# Patient Record
Sex: Male | Born: 1960 | Race: Black or African American | Hispanic: No | Marital: Married | Smoking: Never smoker
Health system: Southern US, Community
[De-identification: ages and names within clinical notes are randomized; demographics above are authoritative.]

## PROBLEM LIST (undated history)

## (undated) DIAGNOSIS — I209 Angina pectoris, unspecified: Secondary | ICD-10-CM

## (undated) DIAGNOSIS — I2510 Atherosclerotic heart disease of native coronary artery without angina pectoris: Secondary | ICD-10-CM

## (undated) DIAGNOSIS — N529 Male erectile dysfunction, unspecified: Secondary | ICD-10-CM

## (undated) DIAGNOSIS — I69351 Hemiplegia and hemiparesis following cerebral infarction affecting right dominant side: Secondary | ICD-10-CM

## (undated) DIAGNOSIS — N189 Chronic kidney disease, unspecified: Secondary | ICD-10-CM

## (undated) DIAGNOSIS — M159 Polyosteoarthritis, unspecified: Secondary | ICD-10-CM

## (undated) DIAGNOSIS — G252 Other specified forms of tremor: Secondary | ICD-10-CM

## (undated) DIAGNOSIS — R011 Cardiac murmur, unspecified: Secondary | ICD-10-CM

## (undated) DIAGNOSIS — I5189 Other ill-defined heart diseases: Secondary | ICD-10-CM

## (undated) DIAGNOSIS — M5030 Other cervical disc degeneration, unspecified cervical region: Secondary | ICD-10-CM

## (undated) DIAGNOSIS — I700 Atherosclerosis of aorta: Secondary | ICD-10-CM

## (undated) DIAGNOSIS — I10 Essential (primary) hypertension: Secondary | ICD-10-CM

## (undated) DIAGNOSIS — Z9289 Personal history of other medical treatment: Secondary | ICD-10-CM

## (undated) DIAGNOSIS — R0600 Dyspnea, unspecified: Secondary | ICD-10-CM

## (undated) DIAGNOSIS — G214 Vascular parkinsonism: Secondary | ICD-10-CM

## (undated) DIAGNOSIS — R55 Syncope and collapse: Secondary | ICD-10-CM

## (undated) DIAGNOSIS — R42 Dizziness and giddiness: Secondary | ICD-10-CM

## (undated) DIAGNOSIS — R259 Unspecified abnormal involuntary movements: Secondary | ICD-10-CM

## (undated) DIAGNOSIS — Z7982 Long term (current) use of aspirin: Secondary | ICD-10-CM

## (undated) DIAGNOSIS — E785 Hyperlipidemia, unspecified: Secondary | ICD-10-CM

## (undated) DIAGNOSIS — R29898 Other symptoms and signs involving the musculoskeletal system: Secondary | ICD-10-CM

## (undated) DIAGNOSIS — Z8739 Personal history of other diseases of the musculoskeletal system and connective tissue: Secondary | ICD-10-CM

## (undated) DIAGNOSIS — I6381 Other cerebral infarction due to occlusion or stenosis of small artery: Secondary | ICD-10-CM

## (undated) DIAGNOSIS — R002 Palpitations: Secondary | ICD-10-CM

## (undated) DIAGNOSIS — K219 Gastro-esophageal reflux disease without esophagitis: Secondary | ICD-10-CM

## (undated) DIAGNOSIS — I639 Cerebral infarction, unspecified: Secondary | ICD-10-CM

## (undated) DIAGNOSIS — G20A1 Parkinson's disease without dyskinesia, without mention of fluctuations: Secondary | ICD-10-CM

## (undated) HISTORY — DX: Syncope and collapse: R55

## (undated) HISTORY — DX: Essential (primary) hypertension: I10

## (undated) HISTORY — PX: HEMORRHOID BANDING: SHX5850

## (undated) HISTORY — DX: Other cerebral infarction due to occlusion or stenosis of small artery: I63.81

## (undated) HISTORY — DX: Hyperlipidemia, unspecified: E78.5

## (undated) HISTORY — DX: Unspecified abnormal involuntary movements: R25.9

## (undated) HISTORY — DX: Palpitations: R00.2

## (undated) HISTORY — DX: Personal history of other medical treatment: Z92.89

## (undated) HISTORY — DX: Dizziness and giddiness: R42

## (undated) HISTORY — DX: Cardiac murmur, unspecified: R01.1

## (undated) HISTORY — DX: Other symptoms and signs involving the musculoskeletal system: R29.898

## (undated) HISTORY — PX: APPENDECTOMY: SHX54

## (undated) HISTORY — DX: Other specified forms of tremor: G25.2

---

## 2004-04-24 ENCOUNTER — Ambulatory Visit: Payer: Self-pay | Admitting: Internal Medicine

## 2009-11-07 ENCOUNTER — Emergency Department: Payer: Self-pay | Admitting: Emergency Medicine

## 2010-07-18 ENCOUNTER — Ambulatory Visit: Payer: Self-pay | Admitting: Anesthesiology

## 2011-12-05 ENCOUNTER — Emergency Department: Payer: Self-pay | Admitting: Unknown Physician Specialty

## 2011-12-05 LAB — COMPREHENSIVE METABOLIC PANEL
Albumin: 4.2 g/dL (ref 3.4–5.0)
BUN: 14 mg/dL (ref 7–18)
Chloride: 107 mmol/L (ref 98–107)
Co2: 27 mmol/L (ref 21–32)
EGFR (African American): >60
EGFR (Non-African Amer.): 59 — ABNORMAL LOW
SGOT(AST): 26 U/L (ref 15–37)
SGPT (ALT): 29 U/L (ref 12–78)
Total Protein: 8.8 g/dL — ABNORMAL HIGH (ref 6.4–8.2)

## 2011-12-05 LAB — URINALYSIS, COMPLETE
Blood: NEGATIVE
Ketone: NEGATIVE
Nitrite: NEGATIVE
Ph: 8 (ref 4.5–8.0)
Protein: NEGATIVE
Specific Gravity: 1.003 (ref 1.003–1.030)
Squamous Epithelial: NONE SEEN

## 2011-12-05 LAB — CBC
HCT: 43.2 % (ref 40.0–52.0)
MCHC: 34.7 g/dL (ref 32.0–36.0)
MCV: 84 fL (ref 80–100)
RBC: 5.18 10*6/uL (ref 4.40–5.90)
WBC: 5.5 10*3/uL (ref 3.8–10.6)

## 2011-12-05 LAB — TROPONIN I: Troponin-I: 0.03 ng/mL

## 2011-12-05 LAB — TSH: Thyroid Stimulating Horm: 1.8 u[IU]/mL

## 2011-12-05 LAB — MAGNESIUM: Magnesium: 1.8 mg/dL

## 2012-08-19 ENCOUNTER — Encounter: Payer: Self-pay | Admitting: Cardiovascular Disease

## 2012-08-19 ENCOUNTER — Ambulatory Visit (INDEPENDENT_AMBULATORY_CARE_PROVIDER_SITE_OTHER): Payer: BC Managed Care – PPO | Admitting: Cardiovascular Disease

## 2012-08-19 VITALS — BP 162/80 | HR 65 | Ht 72.0 in | Wt 315.8 lb

## 2012-08-19 DIAGNOSIS — I10 Essential (primary) hypertension: Secondary | ICD-10-CM | POA: Insufficient documentation

## 2012-08-19 DIAGNOSIS — R42 Dizziness and giddiness: Secondary | ICD-10-CM | POA: Insufficient documentation

## 2012-08-19 NOTE — Assessment & Plan Note (Signed)
Vertigo history, previously seen by ear nose throat. Currently with no symptoms

## 2012-08-19 NOTE — Assessment & Plan Note (Signed)
He reports having recent weight loss. We have commended him on his efforts. We have encouraged continued exercise, careful diet management in an effort to lose weight.

## 2012-08-19 NOTE — Progress Notes (Signed)
Patient ID: Trevor Flores, male    [name]: 1960-12-06, 52 y.o.   MRN: [id number]  HPI Comments: [name] is a very pleasant 52 year old gentleman, patient of Dr. Maryellen Pile, who presents to establish care for hypertension, lightheadedness, history of vertigo.  He states that over the past several years, he has had episodes of dizzy spells lasting 2-3 seconds. His last episode was this past week. He got out of his truck, walked to a store and was waiting at the counter. He had lightheadedness for several seconds before it resolved. He described having a little bit of spinning.  Otherwise she feels well has no complaints. In October 2013, he had positional vertigo described as a spinning when he sat up, better when he laid down. He went to the emergency room, had a CT scan and was told that he may have had an old stroke. Carotid ultrasound per his report showed no significant stenoses. He was seen by ear nose throat for his vertigo.  Blood pressure at home has been well-controlled with systolic pressure 130, diastolic 80. He walks every other day several miles at a time with no problems. He's been losing weight. With his weight declined, cholesterol has been decreasing  Previously seen by Dr. Juliann Pares. Echocardiogram October 2013 documented ejection fraction 60%, normal wall motion. No mention of any valvular disease he states that he took simvastatin on one occasion and that that he had side effects and stopped the medication.   Treadmill nuclear stress test every 2006. He was told that there is no significant blockage. Exercised well on the treadmill  EKG today shows normal sinus rhythm with rate 65 beats per minute, no significant ST or T wave changes  Lab work shows total cholesterol 187, LDL 129, vitamin D 15    Outpatient Encounter Prescriptions as of 08/19/2012  Medication Sig Dispense Refill  . diltiazem (TIAZAC) 420 MG 24 hr capsule Take 420 mg by mouth daily.      Marland Kitchen lisinopril  (PRINIVIL,ZESTRIL) 20 MG tablet Take 20 mg by mouth daily.       Review of Systems  Constitutional: Negative.   HENT: Negative.   Eyes: Negative.   Respiratory: Negative.   Cardiovascular: Negative.   Gastrointestinal: Negative.   Musculoskeletal: Negative.   Skin: Negative.   Neurological: Positive for dizziness.  Psychiatric/Behavioral: Negative.   All other systems reviewed and are negative.    BP 162/80  Pulse 65  Ht 6' (1.829 m)  Wt 315 lb 12 oz (143.223 kg)  BMI 42.81 kg/m2  Physical Exam  Nursing note and vitals reviewed. Constitutional: He is oriented to person, place, and time. He appears well-developed and well-nourished.  HENT:  Head: Normocephalic.  Nose: Nose normal.  Mouth/Throat: Oropharynx is clear and moist.  Eyes: Conjunctivae are normal. Pupils are equal, round, and reactive to light.  Neck: Normal range of motion. Neck supple. No JVD present.  Cardiovascular: Normal rate, regular rhythm, S1 normal, S2 normal, normal heart sounds and intact distal pulses.  Exam reveals no gallop and no friction rub.   No murmur heard. Pulmonary/Chest: Effort normal and breath sounds normal. No respiratory distress. He has no wheezes. He has no rales. He exhibits no tenderness.  Abdominal: Soft. Bowel sounds are normal. He exhibits no distension. There is no tenderness.  Musculoskeletal: Normal range of motion. He exhibits no edema and no tenderness.  Lymphadenopathy:    He has no cervical adenopathy.  Neurological: He is alert and oriented to person, place,  and time. Coordination normal.  Skin: Skin is warm and dry. No rash noted. No erythema.  Psychiatric: He has a normal mood and affect. His behavior is normal. Judgment and thought content normal.      Assessment and Plan

## 2012-08-19 NOTE — Assessment & Plan Note (Signed)
Blood pressures elevated in the office today though he reports well controlled at home with systolic pressure 130s. We have suggested he continue to monitor his blood pressure.

## 2012-08-19 NOTE — Patient Instructions (Addendum)
You are doing well. No medication changes were made. Consider taking Red Yeast Rice for cholesterol  Please call us if you have new issues that need to be addressed before your next appt.  Your physician wants you to follow-up in: 12 months.  You will receive a reminder letter in the mail two months in advance. If you don't receive a letter, please call our office to schedule the follow-up appointment.

## 2012-08-19 NOTE — Assessment & Plan Note (Addendum)
Record reviewed from Dr. Juliann Pares and Dr. Maryellen Pile. Etiology of his dizziness is uncertain. Last for only several seconds, most recently while standing. No evidence of orthostasis. Concerning for arrhythmia. Normal EKG today. If he continues to have symptoms, we could do a Holter monitor or 30 day monitor. We have suggested he take his diltiazem in the evening, lisinopril in the morning to make sure that is not orthostatic.

## 2013-08-06 ENCOUNTER — Ambulatory Visit (INDEPENDENT_AMBULATORY_CARE_PROVIDER_SITE_OTHER): Payer: BC Managed Care – PPO | Admitting: Cardiovascular Disease

## 2013-08-06 ENCOUNTER — Encounter: Payer: Self-pay | Admitting: Cardiovascular Disease

## 2013-08-06 VITALS — BP 140/94 | HR 80 | Ht 72.0 in | Wt 325.8 lb

## 2013-08-06 DIAGNOSIS — R079 Chest pain, unspecified: Secondary | ICD-10-CM

## 2013-08-06 DIAGNOSIS — I10 Essential (primary) hypertension: Secondary | ICD-10-CM

## 2013-08-06 DIAGNOSIS — R42 Dizziness and giddiness: Secondary | ICD-10-CM

## 2013-08-06 NOTE — Assessment & Plan Note (Signed)
Dizziness has resolved since his prior clinic visit one year ago. He feels his symptoms improved after stopping the Cardizem, now on amlodipine lisinopril and HCTZ

## 2013-08-06 NOTE — Assessment & Plan Note (Signed)
Blood pressure well controlled on his current medications. Overall he feels well

## 2013-08-06 NOTE — Assessment & Plan Note (Signed)
We have encouraged continued exercise, careful diet management in an effort to lose weight. 

## 2013-08-06 NOTE — Progress Notes (Signed)
Patient ID: Trevor Flores, male    [name]: Feb 01, 1961, 53 y.o.   MRN: [id number]  HPI Comments: [name] is a very pleasant 53 year old gentleman with a history of hypertension, lightheadedness, history of vertigo.  On his last clinic visit, she was having very brief dizzy episodes, possibly orthostasis. He describes having a little bit of spinning, lightheadedness for several seconds before it would resolve   October 2013, he had positional vertigo described as a spinning when he sat up, better when he laid down. He went to the emergency room, had a CT scan and was told that he may have had an old stroke. Carotid ultrasound per his report showed no significant stenoses. He was seen by ear nose throat for his vertigo.  In followup today, he reports that his medications have been changed. He was previously taking very high dose Cardizem 420 mg daily. This was started by primary care. He has since changed primary care and now is on amlodipine, HCTZ and lisinopril. Symptoms have resolved Overall he feels well, is trying to lose weight. Is a nonsmoker, no diabetes.  Previously seen by Dr. Juliann Flores. Echocardiogram October 2013 documented ejection fraction 60%, normal wall motion. No mention of any valvular disease  Previous symptoms on simvastatin  Treadmill nuclear stress test every 2006. He was told that there is no significant blockage. Exercised well on the treadmill  EKG today shows normal sinus rhythm with rate 80 beats per minute, no significant ST or T wave changes  Prior Lab work shows total cholesterol 187, LDL 129, vitamin D 15 Reports most recent lab work shows Total chol 169    Outpatient Encounter Prescriptions as of 08/06/2013  Medication Sig  . amLODipine (NORVASC) 10 MG tablet Take 10 mg by mouth daily.   . hydrochlorothiazide (HYDRODIURIL) 25 MG tablet Take 12.5 mg by mouth daily.   Marland Kitchen lisinopril (PRINIVIL,ZESTRIL) 20 MG tablet Take 40 mg by mouth daily.   . meloxicam  (MOBIC) 15 MG tablet Take 15 mg by mouth daily as needed.   . [DISCONTINUED] diltiazem (TIAZAC) 420 MG 24 hr capsule Take 420 mg by mouth daily.    Review of Systems  Constitutional: Negative.   HENT: Negative.   Eyes: Negative.   Respiratory: Negative.   Cardiovascular: Negative.   Gastrointestinal: Negative.   Endocrine: Negative.   Musculoskeletal: Negative.   Skin: Negative.   Allergic/Immunologic: Negative.   Neurological: Negative.   Hematological: Negative.   Psychiatric/Behavioral: Negative.   All other systems reviewed and are negative.   BP 140/94  Pulse 80  Ht 6' (1.829 m)  Wt 325 lb 12 oz (147.759 kg)  BMI 44.17 kg/m2  Physical Exam  Nursing note and vitals reviewed. Constitutional: He is oriented to person, place, and time. He appears well-developed and well-nourished.  Obese  HENT:  Head: Normocephalic.  Nose: Nose normal.  Mouth/Throat: Oropharynx is clear and moist.  Eyes: Conjunctivae are normal. Pupils are equal, round, and reactive to light.  Neck: Normal range of motion. Neck supple. No JVD present.  Cardiovascular: Normal rate, regular rhythm, S1 normal, S2 normal, normal heart sounds and intact distal pulses.  Exam reveals no gallop and no friction rub.   No murmur heard. Pulmonary/Chest: Effort normal and breath sounds normal. No respiratory distress. He has no wheezes. He has no rales. He exhibits no tenderness.  Abdominal: Soft. Bowel sounds are normal. He exhibits no distension. There is no tenderness.  Musculoskeletal: Normal range of motion. He exhibits no edema  and no tenderness.  Lymphadenopathy:    He has no cervical adenopathy.  Neurological: He is alert and oriented to person, place, and time. Coordination normal.  Skin: Skin is warm and dry. No rash noted. No erythema.  Psychiatric: He has a normal mood and affect. His behavior is normal. Judgment and thought content normal.      Assessment and Plan

## 2013-08-06 NOTE — Patient Instructions (Addendum)
You are doing well. No medication changes were made.  Please call us if you have new issues that need to be addressed before your next appt.  Your physician wants you to follow-up in: 12 months.  You will receive a reminder letter in the mail two months in advance. If you don't receive a letter, please call our office to schedule the follow-up appointment. 

## 2014-05-11 ENCOUNTER — Telehealth: Payer: Self-pay

## 2014-05-11 NOTE — Telephone Encounter (Signed)
Spoke w/ pt.  He reports that he went to his PCP today, his BP was elevated at 140/100, so his amlodipine was stopped and Bystolic 10 mg was started. Advised him to monitor his BP and call the office if his BP does not come down before his ov.  He is appreciative and will call back w/ any questions or concerns.

## 2014-05-11 NOTE — Telephone Encounter (Signed)
Pt c/o BP issue: STAT if pt c/o blurred vision, one-sided weakness or slurred speech  1. What are your last 5 BP readings? This a.m.140/100 left arm, right arm 120/80  2. Are you having any other symptoms (ex. Dizziness, headache, blurred vision, passed out)? Yes, this morning had a dizzy spell  3. What is your BP issue?   Pt has appt on 3/23

## 2014-05-25 ENCOUNTER — Encounter: Payer: Self-pay | Admitting: Cardiovascular Disease

## 2014-05-25 ENCOUNTER — Encounter (INDEPENDENT_AMBULATORY_CARE_PROVIDER_SITE_OTHER): Payer: Self-pay

## 2014-05-25 ENCOUNTER — Ambulatory Visit (INDEPENDENT_AMBULATORY_CARE_PROVIDER_SITE_OTHER): Payer: BLUE CROSS/BLUE SHIELD | Admitting: Cardiovascular Disease

## 2014-05-25 VITALS — BP 150/92 | HR 64 | Ht 72.0 in | Wt 330.8 lb

## 2014-05-25 DIAGNOSIS — I10 Essential (primary) hypertension: Secondary | ICD-10-CM | POA: Diagnosis not present

## 2014-05-25 DIAGNOSIS — R55 Syncope and collapse: Secondary | ICD-10-CM | POA: Insufficient documentation

## 2014-05-25 NOTE — Assessment & Plan Note (Signed)
Blood pressure is well controlled on today's visit. No changes made to the medications. 

## 2014-05-25 NOTE — Assessment & Plan Note (Signed)
We have encouraged continued exercise, careful diet management in an effort to lose weight. 

## 2014-05-25 NOTE — Progress Notes (Signed)
Patient ID: Trevor Flores, male    [name]: 16-Mar-1960, 54 y.o.   MRN: [id number]  HPI Comments: Trevor Flores is a very pleasant 54 year old gentleman with a history of hypertension, lightheadedness, obesity, history of near syncope  In follow-up today, he reports having 2 episodes of near syncope in the past month. 3 episodes this year For these episodes, his medications were changed for his blood pressure. He continues on lisinopril, now on bystolic. Previously Cardizem was held and he was changed to amlodipine, HCTZ, lisinopril and on this regiment he was doing better on his last clinic visit.  Symptoms of near syncope present at rest while he is sitting lasting for 4-5 seconds at a time described as a severe lightheadedness. Wife checked his blood pressure after one episode and he reports having systolic pressure 140 Typically blood pressure at home running 120s up to 140 with diastolic 80s  He reports previously having carotid ultrasound which showed no significant disease Prior CT scan of the head He Is a nonsmoker, no diabetes.  EKG on today's visit shows normal sinus rhythm with rate 64 bpm with no significant ST or T wave changes Lab work reviewed with him showing total cholesterol 183, LDL 127, normal basic metabolic panel  Other past medical history  October 2013, he had positional vertigo described as a spinning when he sat up, better when he laid down. He went to the emergency room, had a CT scan and was told that he may have had an old stroke. Carotid ultrasound per his report showed no significant stenoses. He was seen by ear nose throat for his vertigo.  Echocardiogram October 2013 documented ejection fraction 60%, normal wall motion. No mention of any valvular disease  Previous symptoms on simvastatin  Treadmill nuclear stress test every 2006. He was told that there is no significant blockage. Exercised well on the treadmill     No Known Allergies  Outpatient  Encounter Prescriptions as of 05/25/2014  Medication Sig  . BYSTOLIC 10 MG tablet Take 10 mg by mouth daily.   Marland Kitchen. lisinopril (PRINIVIL,ZESTRIL) 20 MG tablet Take 40 mg by mouth daily.   Marland Kitchen. VIAGRA 100 MG tablet Take 100 mg by mouth as needed.   . [DISCONTINUED] amLODipine (NORVASC) 10 MG tablet Take 10 mg by mouth daily.   . [DISCONTINUED] hydrochlorothiazide (HYDRODIURIL) 25 MG tablet Take 12.5 mg by mouth daily.   . [DISCONTINUED] meloxicam (MOBIC) 15 MG tablet Take 15 mg by mouth daily as needed.     Past Medical History  Diagnosis Date  . Hypertension   . Hyperlipidemia   . CVA (cerebral infarction)   . Heart murmur   . Vertigo     Past Surgical History  Procedure Laterality Date  . Appendectomy      Social History  reports that he has never smoked. He does not have any smokeless tobacco history on file. He reports that he does not drink alcohol or use illicit drugs.  Family History Family history is unknown by patient.     Review of Systems  Constitutional: Negative.   Respiratory: Negative.   Cardiovascular: Negative.   Gastrointestinal: Negative.   Musculoskeletal: Negative.   Skin: Negative.   Neurological: Negative.   Hematological: Negative.   Psychiatric/Behavioral: Negative.   All other systems reviewed and are negative.   BP 150/92 mmHg  Pulse 64  Ht 6' (1.829 m)  Wt 330 lb 12 oz (150.027 kg)  BMI 44.85 kg/m2  Physical Exam  Constitutional: He is oriented to person, place, and time. He appears well-developed and well-nourished.  Obese  HENT:  Head: Normocephalic.  Nose: Nose normal.  Mouth/Throat: Oropharynx is clear and moist.  Eyes: Conjunctivae are normal. Pupils are equal, round, and reactive to light.  Neck: Normal range of motion. Neck supple. No JVD present.  Cardiovascular: Normal rate, regular rhythm, S1 normal, S2 normal, normal heart sounds and intact distal pulses.  Exam reveals no gallop and no friction rub.   No murmur  heard. Pulmonary/Chest: Effort normal and breath sounds normal. No respiratory distress. He has no wheezes. He has no rales. He exhibits no tenderness.  Abdominal: Soft. Bowel sounds are normal. He exhibits no distension. There is no tenderness.  Musculoskeletal: Normal range of motion. He exhibits no edema or tenderness.  Lymphadenopathy:    He has no cervical adenopathy.  Neurological: He is alert and oriented to person, place, and time. Coordination normal.  Skin: Skin is warm and dry. No rash noted. No erythema.  Psychiatric: He has a normal mood and affect. His behavior is normal. Judgment and thought content normal.      Assessment and Plan   Nursing note and vitals reviewed.

## 2014-05-25 NOTE — Assessment & Plan Note (Signed)
3 episodes of near syncope this year, 2 in the past month. Blood pressure stable, not low. No signs of orthostatic hypotension. Symptoms have persisted despite various medication changes. Labs are essentially normal. Discussed with him various treatment options. We have not ruled out arrhythmia. After long discussion, we will order a 30 day monitor. Less likely tachycardia symptoms have persisted on beta blockers. Unable to exclude bradycardia or pauses. If 30 day monitor is normal in the setting of episodes, could consider MRI/MRA of the head to rule out vertebral arterial disease

## 2014-05-25 NOTE — Patient Instructions (Signed)
You are doing well. No medication changes were made.  We will order a 30 day monitor for near syncope, lightheaded spells  Please call us if you have new issues that need to be addressed before your next appt.  Your physician wants you to follow-up in: 6 months.  You will receive a reminder letter in the mail two months in advance. If you don't receive a letter, please call our office to schedule the follow-up appointment.

## 2014-05-27 DIAGNOSIS — R42 Dizziness and giddiness: Secondary | ICD-10-CM

## 2014-06-07 ENCOUNTER — Telehealth: Payer: Self-pay

## 2014-06-07 NOTE — Telephone Encounter (Signed)
Spoke w/ pt.  Advised him that I do not see anything abnormal on his monitor as of yet.  He states that he felt lightheaded and pushed the button on his event monitor.  Reports that his HR was regular in the 60s at the time.  Discussed w/ pt the purpose of the event monitor, as he thought that it monitored "all" of his vitals. Advised him to continue to wear the monitor, keep a log of episodes, monitor sx, push button if he is symptomatic, and call back w/ any questions or concerns.  He is appreciative of the call.

## 2014-06-07 NOTE — Telephone Encounter (Signed)
Pt states he had an episode of dizziness yesterday, and wants to make sure his monitor picked it up.

## 2014-06-10 ENCOUNTER — Encounter: Payer: Self-pay | Admitting: *Deleted

## 2014-06-10 NOTE — Telephone Encounter (Signed)
Attempted to contact pt.  It sounded like someone picked up the phone, but I could not hear anything.  Will attempt to call back later.

## 2014-06-10 NOTE — Telephone Encounter (Signed)
Spoke w/ pt.  He reports that he has has at least 3 episodes while wearing his event monitor.  He states that if everything looks good on the monitor, that he would like to take it off and proceed to the next step. Advised him that I will print out his reports from [facility] for Dr. Mariah MillingGollan to review and call him back w/ his recommendation.

## 2014-06-10 NOTE — Telephone Encounter (Signed)
  1. Is this related to a heart monitor you are wearing?  (If the patient says no, please ask     if they are caling about ICD/pacemaker.) Heart monitor  2. What is your issue??  (If the patient is calling for results of the heart monitor this     message should be sent to nurse.) Feels the monitor isn't working and pulse is 50. It was taken at 10:00am 06/10/14  This encounter was created in error - please disregard. This encounter was created in error - please disregard. This encounter was created in error - please disregard. This encounter was created in error - please disregard.

## 2014-06-13 MED ORDER — ASPIRIN EC 81 MG PO TBEC
81.0000 mg | DELAYED_RELEASE_TABLET | Freq: Every day | ORAL | Status: DC
Start: 2014-06-13 — End: 2023-07-04

## 2014-06-13 NOTE — Telephone Encounter (Signed)
Left message for pt to call back  °

## 2014-06-13 NOTE — Telephone Encounter (Signed)
Monitor looks relatively normal with no significant arrhythmia Heart rate typically 60. No long periods of bradycardia or pulses

## 2014-06-13 NOTE — Telephone Encounter (Signed)
Spoke w/ pt.  He states that he only has a week or 2 left wearing the monitor and will try to keep it on the full 30 days.  He reports that he recently started taking aspirin 81 mg and has not had any episodes since starting that. Asked him to call back w/ any further questions or concerns.

## 2014-06-16 ENCOUNTER — Other Ambulatory Visit: Payer: Self-pay

## 2014-06-16 DIAGNOSIS — R55 Syncope and collapse: Secondary | ICD-10-CM

## 2014-06-16 DIAGNOSIS — R42 Dizziness and giddiness: Secondary | ICD-10-CM

## 2014-06-17 ENCOUNTER — Other Ambulatory Visit: Payer: Self-pay

## 2014-06-17 DIAGNOSIS — R55 Syncope and collapse: Secondary | ICD-10-CM

## 2014-06-17 DIAGNOSIS — R42 Dizziness and giddiness: Secondary | ICD-10-CM

## 2014-07-19 ENCOUNTER — Telehealth: Payer: Self-pay

## 2014-07-19 NOTE — Telephone Encounter (Signed)
Reviewed results w/ 30 day monitor:  "NSR w/ no significant arrythmias".   He verbalizes understanding and will call back w/ any questions or concerns.

## 2014-07-21 ENCOUNTER — Encounter (INDEPENDENT_AMBULATORY_CARE_PROVIDER_SITE_OTHER): Payer: BLUE CROSS/BLUE SHIELD

## 2014-12-02 ENCOUNTER — Ambulatory Visit (INDEPENDENT_AMBULATORY_CARE_PROVIDER_SITE_OTHER): Payer: BLUE CROSS/BLUE SHIELD | Admitting: Cardiovascular Disease

## 2014-12-02 ENCOUNTER — Encounter: Payer: Self-pay | Admitting: Cardiovascular Disease

## 2014-12-02 VITALS — BP 164/100 | HR 66 | Ht 72.0 in | Wt 325.5 lb

## 2014-12-02 DIAGNOSIS — R42 Dizziness and giddiness: Secondary | ICD-10-CM

## 2014-12-02 DIAGNOSIS — I10 Essential (primary) hypertension: Secondary | ICD-10-CM | POA: Diagnosis not present

## 2014-12-02 DIAGNOSIS — R55 Syncope and collapse: Secondary | ICD-10-CM | POA: Diagnosis not present

## 2014-12-02 NOTE — Assessment & Plan Note (Signed)
He reports that he exercises 2 hours per day. We have encouraged continued exercise, careful diet management in an effort to lose weight.

## 2014-12-02 NOTE — Patient Instructions (Addendum)
You are doing well. No medication changes were made.  Ask Dr. Maryellen Pile about Coreg/carvedilol if the bystolic gets too expensive  Please call us if you have new issues that need to be addressed before your next appt.  Your physician wants you to follow-up in: 12 months.  You will receive a reminder letter in the mail two months in advance. If you don't receive a letter, please call our office to schedule the follow-up appointment.

## 2014-12-02 NOTE — Progress Notes (Signed)
Patient ID: [name], male    [name]: [birth year], 54 y.o.   MRN: [id number]  HPI Comments: [name] is a very pleasant 54 year old gentleman with a history of hypertension, lightheadedness, obesity, history of near syncope He presents for follow-up of his hypertension  In follow-up today, he denies having any episodes of near syncope or syncope, no more lightheadedness. He feels that after starting aspirin 81 mg daily, symptoms resolved  Currently feels well, exercises 2 days per week He is retired Medications managed by Dr. Maryellen Pile. Lisinopril was held, has continued on by systolic 10 mg daily, started on edarbi 40 mg daily Blood pressure at home typically 130 systolic over 80 He Is a nonsmoker, no diabetes.  He reports it was elevated at Dr. Jake Church office and again today which is unusual for him  EKG on today's visit shows normal sinus rhythm with rate 66 bpm, no significant ST or T-wave changes  Other past medical history 3 episodes of near syncope on his last clinic visit Previously Cardizem was held and he was changed to amlodipine, HCTZ, lisinopril   Symptoms of near syncope present at rest while he is sitting lasting for 4-5 seconds at a time described as a severe lightheadedness. Wife checked his blood pressure after one episode and he reports having systolic pressure 140 Typically blood pressure at home running 120s up to 140 with diastolic 80s  He reports previously having carotid ultrasound which showed no significant disease Prior CT scan of the head   total cholesterol 183, LDL 127, normal basic metabolic panel   October 2013, he had positional vertigo described as a spinning when he sat up, better when he laid down. He went to the emergency room, had a CT scan and was told that he may have had an old stroke. Carotid ultrasound per his report showed no significant stenoses. He was seen by ear nose throat for his vertigo.  Echocardiogram October 2013  documented ejection fraction 60%, normal wall motion. No mention of any valvular disease  Previous symptoms on simvastatin  Treadmill nuclear stress test every 2006. He was told that there is no significant blockage. Exercised well on the treadmill     No Known Allergies  Outpatient Encounter Prescriptions as of 12/02/2014  Medication Sig  . aspirin EC 81 MG tablet Take 1 tablet (81 mg total) by mouth daily.  . Azilsartan Medoxomil (EDARBI) 40 MG TABS Take by mouth daily.  Marland Kitchen BYSTOLIC 10 MG tablet Take 10 mg by mouth daily.   . [DISCONTINUED] lisinopril (PRINIVIL,ZESTRIL) 20 MG tablet Take 40 mg by mouth daily.   . [DISCONTINUED] VIAGRA 100 MG tablet Take 100 mg by mouth as needed.    No facility-administered encounter medications on file as of 12/02/2014.    Past Medical History  Diagnosis Date  . Hypertension   . Hyperlipidemia   . CVA (cerebral infarction)   . Heart murmur   . Vertigo     Past Surgical History  Procedure Laterality Date  . Appendectomy      Social History  reports that he has never smoked. His smokeless tobacco use includes Chew. He reports that he does not drink alcohol or use illicit drugs.  Family History Family history is unknown by patient.     Review of Systems  Constitutional: Negative.   Respiratory: Negative.   Cardiovascular: Negative.   Gastrointestinal: Negative.   Musculoskeletal: Negative.   Neurological: Negative.   Hematological: Negative.   Psychiatric/Behavioral: Negative.  All other systems reviewed and are negative.   BP 164/100 mmHg  Pulse 66  Ht 6' (1.829 m)  Wt 325 lb 8 oz (147.646 kg)  BMI 44.14 kg/m2  Physical Exam  Constitutional: He is oriented to person, place, and time. He appears well-developed and well-nourished.  Morbidly Obese  HENT:  Head: Normocephalic.  Nose: Nose normal.  Mouth/Throat: Oropharynx is clear and moist.  Eyes: Conjunctivae are normal. Pupils are equal, round, and reactive to light.   Neck: Normal range of motion. Neck supple. No JVD present.  Cardiovascular: Normal rate, regular rhythm, S1 normal, S2 normal, normal heart sounds and intact distal pulses.  Exam reveals no gallop and no friction rub.   No murmur heard. Pulmonary/Chest: Effort normal and breath sounds normal. No respiratory distress. He has no wheezes. He has no rales. He exhibits no tenderness.  Abdominal: Soft. Bowel sounds are normal. He exhibits no distension. There is no tenderness.  Musculoskeletal: Normal range of motion. He exhibits no edema or tenderness.  Lymphadenopathy:    He has no cervical adenopathy.  Neurological: He is alert and oriented to person, place, and time. Coordination normal.  Skin: Skin is warm and dry. No rash noted. No erythema.  Psychiatric: He has a normal mood and affect. His behavior is normal. Judgment and thought content normal.      Assessment and Plan   Nursing note and vitals reviewed.

## 2014-12-02 NOTE — Assessment & Plan Note (Signed)
Etiology of his previous episodes of near syncope are unclear.  Symptoms resolved after he started taking aspirin Numerous medication changes have been made over the past several months. Now feels better

## 2014-12-02 NOTE — Assessment & Plan Note (Signed)
Symptoms have resolved. No further workup at this time

## 2014-12-02 NOTE — Assessment & Plan Note (Signed)
Blood pressure elevated today. Recommended he continue to monitor his blood pressure at home. He reports systolic pressure 130 at home. If blood pressure does run high, could take extra bystolic

## 2015-02-02 ENCOUNTER — Telehealth: Payer: Self-pay

## 2015-02-02 NOTE — Telephone Encounter (Signed)
Pt would like Bystolic samples. 

## 2015-02-03 NOTE — Telephone Encounter (Signed)
Samples available of bystolic 10 mg Lot # [id number] Exp. 2/17

## 2015-07-24 ENCOUNTER — Ambulatory Visit (INDEPENDENT_AMBULATORY_CARE_PROVIDER_SITE_OTHER): Payer: BLUE CROSS/BLUE SHIELD | Admitting: Cardiovascular Disease

## 2015-07-24 ENCOUNTER — Encounter: Payer: Self-pay | Admitting: Cardiovascular Disease

## 2015-07-24 ENCOUNTER — Encounter (INDEPENDENT_AMBULATORY_CARE_PROVIDER_SITE_OTHER): Payer: Self-pay

## 2015-07-24 VITALS — BP 155/90 | HR 71 | Ht 72.0 in | Wt 332.5 lb

## 2015-07-24 DIAGNOSIS — R55 Syncope and collapse: Secondary | ICD-10-CM

## 2015-07-24 DIAGNOSIS — I10 Essential (primary) hypertension: Secondary | ICD-10-CM | POA: Diagnosis not present

## 2015-07-24 MED ORDER — LOSARTAN POTASSIUM 100 MG PO TABS: 100.0000 mg | ORAL_TABLET | Freq: Every day | ORAL | Status: DC

## 2015-07-24 NOTE — Patient Instructions (Addendum)
You are doing well.  When you run out of Erarbi, Change to losartan one pill a day  Please call us if you have new issues that need to be addressed before your next appt.  Your physician wants you to follow-up in: 6 months.  You will receive a reminder letter in the mail two months in advance. If you don't receive a letter, please call our office to schedule the follow-up appointment.

## 2015-07-24 NOTE — Assessment & Plan Note (Signed)
We have encouraged continued exercise, careful diet management in an effort to lose weight. 

## 2015-07-24 NOTE — Assessment & Plan Note (Signed)
He denies any lightheadedness or dizziness Recommended that he monitor his blood pressure

## 2015-07-24 NOTE — Progress Notes (Signed)
Patient ID: Flores EstelleLeonard K Flores, male    [name]: Feb 21, 1961, 55 y.o.   MRN: [id number]  HPI Comments: [name] is a very pleasant 55 year old gentleman with a history of hypertension, lightheadedness, obesity, history of near syncope He presents for follow-up of his hypertension  In follow-up today, he is working out at [employer] lifting weights, doing some aerobic Reports he is able to bench press 400 pounds Occasionally gets a odd sensation in his mediastinal area, not a pain   he denies having any episodes of near syncope or syncope, no  lightheadedness. He is retired  Medications managed by Dr. Maryellen PileEason. Currently taking edarbi, dose increased up to 80 mg daily Also now on Cardizem 360 mg daily. Denies any leg edema Reports blood pressures well controlled at home Numbers are elevated today He Is a nonsmoker, no diabetes.  EKG on today's visit shows normal sinus rhythm with rate 71 bpm, no significant ST or T-wave changes  Other past medical history 3 episodes of near syncope on his last clinic visit Previously Cardizem was held and he was changed to amlodipine, HCTZ, lisinopril   Symptoms of near syncope present at rest while he is sitting lasting for 4-5 seconds at a time described as a severe lightheadedness. Wife checked his blood pressure after one episode and he reports having systolic pressure 140 Typically blood pressure at home running 120s up to 140 with diastolic 80s  He reports previously having carotid ultrasound which showed no significant disease Prior CT scan of the head   total cholesterol 183, LDL 127, normal basic metabolic panel   October 2013, he had positional vertigo described as a spinning when he sat up, better when he laid down. He went to the emergency room, had a CT scan and was told that he may have had an old stroke. Carotid ultrasound per his report showed no significant stenoses. He was seen by ear nose throat for his vertigo.  Echocardiogram  October 2013 documented ejection fraction 60%, normal wall motion. No mention of any valvular disease  Previous symptoms on simvastatin  Treadmill nuclear stress test every 2006. He was told that there is no significant blockage. Exercised well on the treadmill     Allergies  Allergen Reactions  . Hydralazine Palpitations    Outpatient Encounter Prescriptions as of 07/24/2015  Medication Sig  . aspirin EC 81 MG tablet Take 1 tablet (81 mg total) by mouth daily.  . Azilsartan Medoxomil 80 MG TABS Take 80 mg by mouth daily.   Marland Kitchen. diltiazem (TIAZAC) 360 MG 24 hr capsule Take 360 mg by mouth daily.  Marland Kitchen. losartan (COZAAR) 100 MG tablet Take 1 tablet (100 mg total) by mouth daily.  . [DISCONTINUED] Azilsartan Medoxomil (EDARBI) 40 MG TABS Take 80 mg by mouth daily. Reported on 07/24/2015  . [DISCONTINUED] BYSTOLIC 10 MG tablet Take 10 mg by mouth daily. Reported on 07/24/2015   No facility-administered encounter medications on file as of 07/24/2015.    Past Medical History  Diagnosis Date  . Hypertension   . Hyperlipidemia   . CVA (cerebral infarction)   . Heart murmur   . Vertigo     Past Surgical History  Procedure Laterality Date  . Appendectomy      Social History  reports that he has never smoked. His smokeless tobacco use includes Chew. He reports that he does not drink alcohol or use illicit drugs.  Family History Family history is unknown by patient.  Review of Systems  Constitutional: Negative.   Respiratory: Negative.   Cardiovascular: Negative.   Gastrointestinal: Negative.   Musculoskeletal: Negative.   Neurological: Negative.   Hematological: Negative.   Psychiatric/Behavioral: Negative.   All other systems reviewed and are negative.   BP 155/90 mmHg  Pulse 71  Ht 6' (1.829 m)  Wt 332 lb 8 oz (150.821 kg)  BMI 45.09 kg/m2  Physical Exam  Constitutional: He is oriented to person, place, and time. He appears well-developed and well-nourished.  Morbidly  Obese  HENT:  Head: Normocephalic.  Nose: Nose normal.  Mouth/Throat: Oropharynx is clear and moist.  Eyes: Conjunctivae are normal. Pupils are equal, round, and reactive to light.  Neck: Normal range of motion. Neck supple. No JVD present.  Cardiovascular: Normal rate, regular rhythm, S1 normal, S2 normal, normal heart sounds and intact distal pulses.  Exam reveals no gallop and no friction rub.   No murmur heard. Pulmonary/Chest: Effort normal and breath sounds normal. No respiratory distress. He has no wheezes. He has no rales. He exhibits no tenderness.  Abdominal: Soft. Bowel sounds are normal. He exhibits no distension. There is no tenderness.  Musculoskeletal: Normal range of motion. He exhibits no edema or tenderness.  Lymphadenopathy:    He has no cervical adenopathy.  Neurological: He is alert and oriented to person, place, and time. Coordination normal.  Skin: Skin is warm and dry. No rash noted. No erythema.  Psychiatric: He has a normal mood and affect. His behavior is normal. Judgment and thought content normal.      Assessment and Plan   Nursing note and vitals reviewed.

## 2015-07-24 NOTE — Assessment & Plan Note (Signed)
He reports that he is unable to afford the edarbi provided by Dr. Maryellen PileEason (he received samples) Suggested he try alternate medication such as losartan 100 mg once a day If blood pressure runs high, recommended a call our office for further medication adjustment

## 2015-08-18 ENCOUNTER — Telehealth: Payer: Self-pay | Admitting: Cardiovascular Disease

## 2015-08-18 NOTE — Telephone Encounter (Signed)
[facility] w/ pt[facility]  [facility] Dr. Windell HummingbirdGollan's note shows: "He reports that he is unable to afford the edarbi provided by Dr. Maryellen PileEason (he received samples) Suggested he try alternate medication such as losartan 100 mg once a day If blood pressure runs high, recommended a call our office for further medication adjustment" He reports that he picked up rx for losartan after last ov, but has not started, as he was trying to finish his samples of edarbi.  He would like to know what further med mgmt he can do and possibly add a 3rd pill. Advised him to go ahead and stop edarbi, start losartan, monitor BP and call Mon w/ readings. He verbalizes understanding and is agreeable.

## 2015-08-18 NOTE — Telephone Encounter (Signed)
Pt c/o BP issue: STAT if pt c/o blurred vision, one-sided weakness or slurred speech  1. What are your last 5 BP readings? 150/85-90  2. Are you having any other symptoms (ex. Dizziness, headache, blurred vision, passed out)? Eyes acting funny every now and then   3. What is your BP issue? Patient said Trevor Flores mentioned adding a 3rd medication for bp control.  Please call

## 2015-08-30 ENCOUNTER — Other Ambulatory Visit: Payer: Self-pay | Admitting: *Deleted

## 2015-08-30 MED ORDER — LOSARTAN POTASSIUM 100 MG PO TABS: 100.0000 mg | ORAL_TABLET | Freq: Every day | ORAL | Status: DC

## 2015-10-04 ENCOUNTER — Other Ambulatory Visit: Payer: Self-pay

## 2015-10-04 ENCOUNTER — Telehealth: Payer: Self-pay

## 2015-10-04 NOTE — Telephone Encounter (Signed)
Gastroenterology Pre-Procedure Review  Request Date: 12/12/2015 Requesting Physician:   PATIENT REVIEW QUESTIONS: The patient responded to the following health history questions as indicated:    1. Are you having any GI issues? yes (hemorrhoids bleed once in a while, constipation) 2. Do you have a personal history of Polyps? no 3. Do you have a family history of Colon Cancer or Polyps? no 4. Diabetes Mellitus? no 5. Joint replacements in the past 12 months?no 6. Major health problems in the past 3 months?no 7. Any artificial heart valves, MVP, or defibrillator?no    MEDICATIONS & ALLERGIES:    Patient reports the following regarding taking any anticoagulation/antiplatelet therapy:   Plavix, Coumadin, Eliquis, Xarelto, Lovenox, Pradaxa, Brilinta, or Effient? no Aspirin? yes (blood thinner)  Patient confirms/reports the following medications:  Current Outpatient Prescriptions  Medication Sig Dispense Refill  . aspirin EC 81 MG tablet Take 1 tablet (81 mg total) by mouth daily. 90 tablet 3  . Azilsartan Medoxomil 80 MG TABS Take 80 mg by mouth daily.     Marland Kitchen diltiazem (TIAZAC) 360 MG 24 hr capsule Take 360 mg by mouth daily.    Marland Kitchen losartan (COZAAR) 100 MG tablet Take 1 tablet (100 mg total) by mouth daily. 90 tablet 3  . meloxicam (MOBIC) 15 MG tablet TK ONE T PO QD  3   No current facility-administered medications for this visit.     Patient confirms/reports the following allergies:  Allergies  Allergen Reactions  . Hydralazine Palpitations    No orders of the defined types were placed in this encounter.   AUTHORIZATION INFORMATION Primary Insurance: 1D#: Group #:  Secondary Insurance: 1D#: Group #:  SCHEDULE INFORMATION: Date: 12/12/2015 Time: Location: [facility]

## 2015-10-04 NOTE — Telephone Encounter (Signed)
Screening Colonoscopy Z12.11 [facility] 12/12/2015 Please pre cert  

## 2016-01-22 ENCOUNTER — Encounter: Payer: Self-pay | Admitting: Cardiovascular Disease

## 2016-01-22 ENCOUNTER — Ambulatory Visit (INDEPENDENT_AMBULATORY_CARE_PROVIDER_SITE_OTHER): Payer: BLUE CROSS/BLUE SHIELD | Admitting: Cardiovascular Disease

## 2016-01-22 VITALS — BP 162/100 | HR 65 | Ht 72.0 in | Wt 319.5 lb

## 2016-01-22 DIAGNOSIS — R55 Syncope and collapse: Secondary | ICD-10-CM | POA: Diagnosis not present

## 2016-01-22 DIAGNOSIS — R079 Chest pain, unspecified: Secondary | ICD-10-CM | POA: Diagnosis not present

## 2016-01-22 DIAGNOSIS — I10 Essential (primary) hypertension: Secondary | ICD-10-CM

## 2016-01-22 MED ORDER — METOPROLOL SUCCINATE ER 100 MG PO TB24: 100.0000 mg | ORAL_TABLET | Freq: Every day | ORAL | 3 refills | Status: DC

## 2016-01-22 NOTE — Patient Instructions (Signed)

## 2016-01-22 NOTE — [facility] Notes (Signed)
Cardiology Office Note  Date:  01/22/2016   ID:  [name], [name] October 14, 1960, MRN [id number]  PCP:  Trevor Flores,  Trevor B, MD   Chief Complaint  Patient presents with  . other    6 month f/u c/o last week chest discomfort. Meds reviewed verbally with pt.    HPI:  Trevor Flores is a very pleasant 55 year old gentleman with a history of hypertension, lightheadedness, obesity, history of near syncope He presents for follow-up of his hypertension  He reports that losartan did not seem to hold his blood pressure in the past Was changed to metoprolol succinate 200 mg daily Felt overmedicated, very fatigued He is been cutting the dose in half, takes 100 mg daily He did receive samples of edarbi 80 mg daily He has been monitoring blood pressure at home, numbers running 120s up to 140s systolic on a regular basis. Elevated number in the office today abnormal for him per the patient Otherwise he feels well, continues to work out at the gym No significant chest pain She did have one episode of sharp chest pain lasting 1 second, presented at rest, no further episodes  total chol 184, LDL 119, HBA1C 5.4  he denies having any episodes of near syncope or syncope, no  lightheadedness. 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 Wife checked his blood pressure after one episode and he reports having systolic pressure 140 Typically blood pressure at home running 120s up to 140 with diastolic 80s  He reports previously having carotid ultrasound which showed no significant disease Prior CT scan of the  head   total cholesterol 183, LDL 127, normal basic metabolic panel   October 2013, he had positional vertigo described as a spinning when he sat up, better when he laid down. He went to the emergency room, had a CT scan and was told that he may have had an old stroke. Carotid ultrasound per his report showed no significant stenoses. He was seen by ear nose throat for his vertigo.  Echocardiogram October 2013 documented ejection fraction 60%, normal wall motion. No mention of any valvular disease  Previous symptoms on simvastatin  Treadmill nuclear stress test every 2006. He was told that there is no significant blockage. Exercised well on the treadmill   PMH:   has a past medical history of CVA (cerebral infarction); Heart murmur; Hyperlipidemia; Hypertension; and Vertigo.  PSH:    Past Surgical History:  Procedure Laterality Date  . APPENDECTOMY      Current Outpatient Prescriptions  Medication Sig Dispense Refill  . aspirin EC 81 MG tablet Take 1 tablet (81 mg total) by mouth daily. 90 tablet 3  . Azilsartan Medoxomil (EDARBI) 80 MG TABS Take by mouth daily.    Marland Kitchen. diltiazem (TIAZAC) 360 MG 24 hr capsule Take 360 mg by mouth daily.    . meloxicam (MOBIC) 15 MG tablet TK ONE T PO QD  3  . metoprolol succinate (TOPROL-XL) 100 MG 24 hr tablet Take 1 tablet (100 mg total) by mouth daily. Take with or immediately following a meal. 90 tablet  3   No current facility-administered medications for this visit.      Allergies:   Hydralazine   Social History:  The patient  reports that he has never smoked. His smokeless tobacco use includes Chew. He reports that he does not drink alcohol or use drugs.   Family History:   Family history is unknown by patient.    Review of Systems: Review of Systems  Constitutional: Negative.   Respiratory: Negative.   Cardiovascular: Negative.        Brief episode of chest pain lasting one second  Gastrointestinal: Negative.   Musculoskeletal:  Negative.   Neurological: Negative.   Psychiatric/Behavioral: Negative.   All other systems reviewed and are negative.    PHYSICAL EXAM: VS:  BP (!) 162/100 (BP Location: Left Arm, Patient Position: Sitting, Cuff Size: Large)   Pulse 65   Ht 6' (1.829 m)   Wt (!) 319 lb 8 oz (144.9 kg)   BMI 43.33 kg/m  , BMI Body mass index is 43.33 kg/m. GEN: Well nourished, well developed, in no acute distress  HEENT: normal  Neck: no JVD, carotid bruits, or masses Cardiac: RRR; no murmurs, rubs, or gallops,no edema  Respiratory:  clear to auscultation bilaterally, normal work of breathing GI: soft, nontender, nondistended, + BS MS: no deformity or atrophy  Skin: warm and dry, no rash Neuro:  Strength and sensation are intact Psych: euthymic mood, full affect    Recent Labs: No results found for requested labs within last 8760 hours.    Lipid Panel No results found for: CHOL, HDL, LDLCALC, TRIG    Wt Readings from Last 3 Encounters:  01/22/16 (!) 319 lb 8 oz (144.9 kg)  07/24/15 (!) 332 lb 8 oz (150.8 kg)  12/02/14 (!) 325 lb 8 oz (147.6 kg)       ASSESSMENT AND PLAN:  Essential hypertension - Plan: EKG 12-Lead Blood pressure controlled at home Elevated in the office today but he reports this is well above his normal Samples provided of edarbi We have called the company to see if okay Cardizem available to make this more affordable. Reports that losartan in the past did not control his blood pressure. Other options include adding HCTZ with losartan, Other medication options available including isosorbide, hydralazine, Cardura  Morbid obesity (HCC) Weight is slowly trending down Continues to work out at the gym  Near syncope Denies any near syncope or syncope symptoms. No further workup needed at this time  Chest pain Atypical symptoms, Discussed cardiac coronary  Stenosis symptoms, anginal symptoms with him His symptoms a very abnormal, presenting at rest, only  happened one time, lasted 1 second Otherwise has been working out on a regular basis without symptoms    Total encounter time more than 25 minutes  Greater than 50% was spent in counseling and coordination of care with the patient   Disposition:   F/U  12 months   Orders Placed This Encounter  Procedures  . EKG 12-Lead     Signed, Dossie Arbourim Gollan, M.D., Ph.D. 01/22/2016  [facility], [place] [phone]

## 2016-02-01 ENCOUNTER — Telehealth: Payer: Self-pay | Admitting: Cardiovascular Disease

## 2016-02-01 MED ORDER — METOPROLOL SUCCINATE ER 50 MG PO TB24: 50.0000 mg | ORAL_TABLET | Freq: Two times a day (BID) | ORAL | 3 refills | Status: DC

## 2016-02-01 NOTE — Telephone Encounter (Signed)
Pt calling stating he is on 100 mg metoprolol  Pt has been on this a couple days  He does 50 mg in the morning and then again at night He just wants to know if this is okay to do  He states it is about every 12 hours he is taking it Please advise.

## 2016-02-01 NOTE — Telephone Encounter (Signed)
Spoke w/ pt.  He reports that taking 100 mg metoprolol succinate in the am was not lasting a full 24 hrs. Reports elevated BPs and HA around 2 am. Pharmacist advised him that pills are scored, so he may cut them in 1/2 and take 50 mg BID. He reports that this works very well for him and keeps his BP down during the night. Advised him that I will update his chart, to call back if he has any other questions or concerns.

## 2016-02-03 ENCOUNTER — Emergency Department
Admission: EM | Admit: 2016-02-03 | Discharge: 2016-02-03 | Disposition: A | Discharge status: Home / Self Care | Payer: BLUE CROSS/BLUE SHIELD | Attending: Emergency Medicine | Admitting: Emergency Medicine

## 2016-02-03 ENCOUNTER — Other Ambulatory Visit: Payer: Self-pay

## 2016-02-03 ENCOUNTER — Encounter: Payer: Self-pay | Admitting: *Deleted

## 2016-02-03 DIAGNOSIS — I10 Essential (primary) hypertension: Secondary | ICD-10-CM | POA: Diagnosis not present

## 2016-02-03 DIAGNOSIS — F17220 Nicotine dependence, chewing tobacco, uncomplicated: Secondary | ICD-10-CM | POA: Diagnosis not present

## 2016-02-03 DIAGNOSIS — Z7982 Long term (current) use of aspirin: Secondary | ICD-10-CM | POA: Insufficient documentation

## 2016-02-03 DIAGNOSIS — Z79899 Other long term (current) drug therapy: Secondary | ICD-10-CM | POA: Diagnosis not present

## 2016-02-03 DIAGNOSIS — R5383 Other fatigue: Secondary | ICD-10-CM | POA: Diagnosis present

## 2016-02-03 LAB — CBC
HCT: 43.1 % (ref 40.0–52.0)
Hemoglobin: 15 g/dL (ref 13.0–18.0)
MCH: 29.2 pg (ref 26.0–34.0)
MCHC: 34.8 g/dL (ref 32.0–36.0)
MCV: 83.8 fL (ref 80.0–100.0)
Platelets: 209 10*3/uL (ref 150–440)
RBC: 5.14 MIL/uL (ref 4.40–5.90)
RDW: 14.3 % (ref 11.5–14.5)
WBC: 5.9 10*3/uL (ref 3.8–10.6)

## 2016-02-03 LAB — BASIC METABOLIC PANEL
Anion gap: 8 (ref 5–15)
BUN: 14 mg/dL (ref 6–20)
CALCIUM: 9.2 mg/dL (ref 8.9–10.3)
CO2: 28 mmol/L (ref 22–32)
Chloride: 101 mmol/L (ref 101–111)
Creatinine, Ser: 1.29 mg/dL — ABNORMAL HIGH (ref 0.61–1.24)
GFR calc Af Amer: >60 mL/min (ref >60)
GLUCOSE: 106 mg/dL — AB (ref 65–99)
Potassium: 3.4 mmol/L — ABNORMAL LOW (ref 3.5–5.1)
Sodium: 137 mmol/L (ref 135–145)

## 2016-02-03 LAB — TROPONIN I

## 2016-02-03 NOTE — ED Triage Notes (Signed)
Pt c/o HTN that has been uncontrolled since last night. Pt has hx of HTN and checks his B/P at least 4 times a day per report. Pt also c/o L eye pain at 1900. Pt states he has been checking his B/P hourly since he took his meds for same yesterday evening. Pt denies ShoB and chest pain. Pt denies weakness, dizziness, or hemiplegia.

## 2016-02-03 NOTE — Discharge Instructions (Signed)
1. You may take an extra metoprolol 50mg  at night if your blood pressure is greater than 160 for the top number or greater than 100 for the bottom number. 2. Continue to record in your blood pressure log 3-4 times daily and bring this to your doctor. 3. Return to the ER for worsened symptoms, persistent vomiting, difficulty breathing or other concerns.

## 2016-02-03 NOTE — ED Notes (Addendum)
Pt presents to ED 18 c/o hypertension; pt states he's been checking his bp about every hour; pt denies any shortness of breath, dizziness, lightheadedness, nausea, vomiting, or blurry vision; asked pt about the eye pain and pt denies any eye pain at this time; pt states "eye hurt for a minute, then went away"

## 2016-02-03 NOTE — ED Notes (Signed)
Pt is in good condition; discharge instructions reviewed, follow up care and home care reviewed; pt verbalized understanding; pt is ambulatory and went home with wife.

## 2016-02-03 NOTE — ED Provider Notes (Signed)
[facility] Emergency Department Provider Note   ____________________________________________   None    (approximate)  I have reviewed the triage vital signs and the nursing notes.   HISTORY  Chief Complaint Hypertension and Eye Pain    HPI Trevor Flores is a 55 y.o. male who presents to the ED from home with a chief complaint of elevated blood pressure and left eye pain. Patient has a history of hypertension which has been difficult to control. His PCP had him on losartan which patient reports has not helped his pressures. 2 weeks ago his cardiologist took him off losartan and initiated metoprolol ER. His initial dose of metoprolol was 200 mg daily which caused generalized fatigue. He was then switched to 100 mg daily and patient found the best symptomatic effect by taking the metoprolol 50 mg twice daily.He has also been taking Edarbi 80 mg daily. His cardiologist gave him samples of Edarbyclor which he has not yet started. Patient reports he was not having symptoms and was taking his blood pressure as he normally does in the evening. States his BP was 160s/90s; baseline BP is 140s/80s. Subsequently he had a sharp pain behind his left eye which lasted 1 second then resolved. Denies headache, vision changes, neck pain, chest pain, shortness of breath, abdominal pain, nausea, vomiting, diarrhea. Denies recent travel trauma. Took an extra dose of  50mg  metoprolol which has helped his blood pressure.   Past Medical History:  Diagnosis Date  . CVA (cerebral infarction)   . Heart murmur   . Hyperlipidemia   . Hypertension   . Vertigo     Patient Active Problem List   Diagnosis Date Noted  . Chest pain 01/22/2016  . Near syncope 05/25/2014  . Dizziness 08/19/2012  . Vertigo 08/19/2012  . Essential hypertension 08/19/2012  . Morbid obesity (HCC) 08/19/2012    Past Surgical History:  Procedure Laterality Date  . APPENDECTOMY      Prior to Admission  medications   Medication Sig Start Date End Date Taking? Authorizing Provider  hydrochlorothiazide (HYDRODIURIL) 12.5 MG tablet Take 12.5 mg by mouth daily.   Yes Historical Provider, MD  aspirin EC 81 MG tablet Take 1 tablet (81 mg total) by mouth daily. 06/13/14   Historical Provider, MD  Azilsartan Medoxomil (EDARBI) 80 MG TABS Take by mouth daily.    Historical Provider, MD  diltiazem (TIAZAC) 360 MG 24 hr capsule Take 360 mg by mouth daily.    Historical Provider, MD  meloxicam (MOBIC) 15 MG tablet TK ONE T PO QD 08/13/15   Historical Provider, MD  metoprolol succinate (TOPROL-XL) 50 MG 24 hr tablet Take 1 tablet (50 mg total) by mouth 2 (two) times daily. Take with or immediately following a meal. 02/01/16   Antonieta Ibaimothy J Gollan, MD    Allergies Hydralazine  Family History  Problem Relation Age of Onset  . Family history unknown: Yes    Social History Social History  Substance Use Topics  . Smoking status: Never Smoker  . Smokeless tobacco: Current User    Types: Chew  . Alcohol use No    Review of Systems  Constitutional: No fever/chills. Eyes: 1 second episode of sharp left eye pain. No visual changes. ENT: No sore throat. Cardiovascular: Denies chest pain. Respiratory: Denies shortness of breath. Gastrointestinal: No abdominal pain.  No nausea, no vomiting.  No diarrhea.  No constipation. Genitourinary: Negative for dysuria. Musculoskeletal: Negative for back pain. Skin: Negative for rash. Neurological: Negative for  headaches, focal weakness or numbness.  10-point ROS otherwise negative.  ____________________________________________   PHYSICAL EXAM:  VITAL SIGNS: ED Triage Vitals  Enc Vitals Group     BP 02/03/16 0317 (!) 162/92     Pulse Rate 02/03/16 0317 69     Resp 02/03/16 0317 (!) 22     Temp 02/03/16 0317 98.2 F (36.8 C)     Temp Source 02/03/16 0317 Oral     SpO2 02/03/16 0317 98 %     Weight 02/03/16 0312 (!) 319 lb (144.7 kg)     Height 02/03/16  0312 6' (1.829 m)     Head Circumference --      Peak Flow --      Pain Score 02/03/16 0312 3     Pain Loc --      Pain Edu? --      Excl. in GC? --     Constitutional: Alert and oriented. Well appearing and in no acute distress. Eyes: Conjunctivae are normal. PERRL. EOMI. Head: Atraumatic. Nose: No congestion/rhinnorhea. Mouth/Throat: Mucous membranes are moist.  Oropharynx non-erythematous. Neck: No stridor.  No carotid bruits.  Cardiovascular: Normal rate, regular rhythm. Grossly normal heart sounds.  Good peripheral circulation. Respiratory: Normal respiratory effort.  No retractions. Lungs CTAB. Gastrointestinal: Soft and nontender. No distention. No abdominal bruits. No CVA tenderness. Musculoskeletal: No lower extremity tenderness nor edema.  No joint effusions. Neurologic:  Essential RUE tremor. Normal speech and language. No gross focal neurologic deficits are appreciated. No gait instability. Skin:  Skin is warm, dry and intact. No rash noted. Psychiatric: Mood and affect are normal. Speech and behavior are normal.  ____________________________________________   LABS (all labs ordered are listed, but only abnormal results are displayed)  Labs Reviewed  BASIC METABOLIC PANEL - Abnormal; Notable for the following:       Result Value   Potassium 3.4 (*)    Glucose, Bld 106 (*)    Creatinine, Ser 1.29 (*)    All other components within normal limits  CBC  TROPONIN I   ____________________________________________  EKG  ED ECG REPORT I, Pessy Delamar J, the attending physician, personally viewed and interpreted this ECG.   Date: 02/03/2016  EKG Time: 0322  Rate: 66  Rhythm: normal EKG, normal sinus rhythm  Axis: Normal  Intervals:none  ST&T Change: Nonspecific  ____________________________________________  RADIOLOGY  None ____________________________________________   PROCEDURES  Procedure(s) performed: None  Procedures  Critical Care performed:  No  ____________________________________________   INITIAL IMPRESSION / ASSESSMENT AND PLAN / ED COURSE  Pertinent labs & imaging results that were available during my care of the patient were reviewed by me and considered in my medical decision making (see chart for details).  55 year old male with a history of hypertension who presents with persistently elevated pressure tonight. Patient took an extra 50 mg metoprolol prior to arrival and currently his blood pressure is 143/92. He is asymptomatic, eye pain resolved prior to arrival and he is not experiencing any chest pain, shortness of breath or headache. Tells me he takes his blood pressure almost 15 times daily. We discussed taking his blood pressure fewer times, may be 3-4 times daily unless he is experiencing symptoms. Since he was just started on metoprolol, I encouraged him to remain on 50 mg twice daily. He may continue to take an extra 50 mg if his blood pressure remains persistently elevated at night. Since tonight was the first episode of his pressure remaining elevated, patient will be vigilant to monitor  his blood pressure at night, especially if he is experiencing symptoms. He will follow up with his cardiologist next week. Strict return precautions given. Patient and spouse verbalize understanding and agrees with plan of care.  Clinical Course      ____________________________________________   FINAL CLINICAL IMPRESSION(S) / ED DIAGNOSES  Final diagnoses:  Essential hypertension      NEW MEDICATIONS STARTED DURING THIS VISIT:  New Prescriptions   No medications on file     Note:  This document was prepared using Dragon voice recognition software and may include unintentional dictation errors.    Irean HongJade J Ellias Mcelreath, MD 02/03/16 [phone]

## 2016-02-05 ENCOUNTER — Ambulatory Visit (INDEPENDENT_AMBULATORY_CARE_PROVIDER_SITE_OTHER): Payer: BLUE CROSS/BLUE SHIELD | Admitting: Cardiovascular Disease

## 2016-02-05 ENCOUNTER — Encounter: Payer: Self-pay | Admitting: Cardiovascular Disease

## 2016-02-05 ENCOUNTER — Telehealth: Payer: Self-pay | Admitting: Cardiovascular Disease

## 2016-02-05 VITALS — BP 146/90 | HR 54 | Ht 72.0 in | Wt 320.0 lb

## 2016-02-05 DIAGNOSIS — H5710 Ocular pain, unspecified eye: Secondary | ICD-10-CM | POA: Diagnosis not present

## 2016-02-05 DIAGNOSIS — I10 Essential (primary) hypertension: Secondary | ICD-10-CM | POA: Diagnosis not present

## 2016-02-05 NOTE — Progress Notes (Signed)
Cardiology Office Note  Date:  02/05/2016   ID:  Trevor Flores, [name] 1960/06/08, MRN [id number]  PCP:  Derwood KaplanEason,  Ernest B, MD   Chief Complaint  Patient presents with  . other    Follow up from [facility] ER from elevated BP. Meds reviewed by the pt. verbally.     HPI:  Trevor Flores is a very pleasant 55 year old gentleman with a history of hypertension, lightheadedness, obesity, history of near syncope He presents for follow-up of his hypertension  He was seen recently in the emergency room for eye pain  Symptoms lasted for 2 seconds then resolved  He was worried as blood pressure was running high  No medication changes made at the time of discharge  Has been checking his blood pressure at home, typically running 130 up to 140 systolic  He is concerned about HCTZ causing polyuria , he has BPH per the patient  Worried about low heart rate, secondary to metoprolol  Worried about obtaining samples of  Edarbi. Denies any significant chest pain Goes to the gym, [employer] frequently  Otherwise he reports that he feels well with no complaints  Other past medical history reviewed Patient previously started on metoprolol succinate 200 mg daily by Dr. Margarite GougeEason Felt overmedicated, very fatigued Cut this down on his own to 100 mg daily  Receiving samples of edarbi 80 mg daily  total chol 184, LDL 119, HBA1C 5.4 Nonsmoker, no diabetes  3 episodes of near syncope on his last clinic visit Previously Cardizem was held and he was changed to amlodipine, HCTZ, lisinopril   Symptoms of near syncope present at rest while he is sitting lasting for 4-5 seconds at a time described as a severe lightheadedness. Wife checked his blood pressure after one episode and he reports having systolic pressure 140 Typically blood pressure at home running 120s up to 140 with diastolic 80s  He reports previously having carotid ultrasound which showed no significant disease Prior CT scan of the head  total  cholesterol 183, LDL 127, normal basic metabolic panel  October 2013, he had positional vertigo described as a spinning when he sat up, better when he laid down. He went to the emergency room, had a CT scan and was told that he may have had an old stroke. Carotid ultrasound per his report showed no significant stenoses. He was seen by ear nose throat for his vertigo.  Echocardiogram October 2013 documented ejection fraction 60%, normal wall motion. No mention of any valvular disease  Previous symptoms on simvastatin  Treadmill nuclear stress test every 2006. He was told that there is no significant blockage. Exercised well on the treadmill   PMH:   has a past medical history of CVA (cerebral infarction); Heart murmur; Hyperlipidemia; Hypertension; and Vertigo.  PSH:    Past Surgical History:  Procedure Laterality Date  . APPENDECTOMY      Current Outpatient Prescriptions  Medication Sig Dispense Refill  . aspirin EC 81 MG tablet Take 1 tablet (81 mg total) by mouth daily. 90 tablet 3  . Azilsartan Medoxomil (EDARBI) 80 MG TABS Take by mouth daily.    Marland Kitchen. diltiazem (TIAZAC) 360 MG 24 hr capsule Take 360 mg by mouth daily.    . hydrochlorothiazide (HYDRODIURIL) 12.5 MG tablet Take 12.5 mg by mouth daily.    . meloxicam (MOBIC) 15 MG tablet TK ONE T PO QD  3  . metoprolol succinate (TOPROL-XL) 50 MG 24 hr tablet Take 1 tablet (50 mg total) by  mouth 2 (two) times daily. Take with or immediately following a meal. 180 tablet 3   No current facility-administered medications for this visit.      Allergies:   Hydralazine   Social History:  The patient  reports that he has never smoked. His smokeless tobacco use includes Chew. He reports that he does not drink alcohol or use drugs.   Family History:   Family history is unknown by patient.    Review of Systems: Review of Systems  Constitutional: Negative.   Respiratory: Negative.   Cardiovascular: Negative.   Gastrointestinal:  Negative.   Musculoskeletal: Negative.   Neurological: Negative.   Psychiatric/Behavioral: Negative.   All other systems reviewed and are negative.    PHYSICAL EXAM: VS:  BP (!) 146/90 (BP Location: Left Arm, Patient Position: Sitting, Cuff Size: Large)   Pulse (!) 54   Ht 6' (1.829 m)   Wt (!) 320 lb (145.2 kg)   BMI 43.40 kg/m  , BMI Body mass index is 43.4 kg/m. GEN: Well nourished, well developed, in no acute distress  HEENT: normal  Neck: no JVD, carotid bruits, or masses Cardiac: RRR; no murmurs, rubs, or gallops,no edema  Respiratory:  clear to auscultation bilaterally, normal work of breathing GI: soft, nontender, nondistended, + BS MS: no deformity or atrophy  Skin: warm and dry, no rash Neuro:  Strength and sensation are intact Psych: euthymic mood, full affect    Recent Labs: 02/03/2016: BUN 14; Creatinine, Ser 1.29; Hemoglobin 15.0; Platelets 209; Potassium 3.4; Sodium 137    Lipid Panel No results found for: CHOL, HDL, LDLCALC, TRIG   I pain Wt Readings from Last 3 Encounters:  02/05/16 (!) 320 lb (145.2 kg)  02/03/16 (!) 319 lb (144.7 kg)  01/22/16 (!) 319 lb 8 oz (144.9 kg)       ASSESSMENT AND PLAN:  Essential hypertension  recent hospital records reviewed  recommended he stay on his current medications Long discussion with him concerning side effects of his pills If he does not like HCTZ polyuria, metoprolol causing bradycardia, could start him on Cardura for his BPH other options include hydralazine, isosorbide .  Morbid obesity (HCC) We have encouraged continued exercise, careful diet management in an effort to lose weight.  Eye pain Hospital records reviewed Etiology unclear went to the emergency room, symptoms lasted only seconds He feels it was from sinus issues   Total encounter time more than 25 minutes  Greater than 50% was spent in counseling and coordination of care with the patient   Disposition:   F/U  6  months     Signed, Dossie Arbourim Gollan, M.D., Ph.D. 02/05/2016  [facility], [place] [phone]

## 2016-02-05 NOTE — Telephone Encounter (Signed)
Pt was seen in ED on Saturday but was recently in to see Dr Mariah MillingGollan.  Can we call back and determine if patient needs to be seen in office  Please advise.

## 2016-02-05 NOTE — Telephone Encounter (Signed)
He does need an ED f/u. Hypertensive emergency is a new issue.

## 2016-02-05 NOTE — Telephone Encounter (Signed)
Pt is coming today at 2:20 pm to see Dr Mariah MillingGollan

## 2016-02-05 NOTE — Patient Instructions (Signed)
Medication Instructions:   No medication changes made  If HCTZ gets to be too much Metoprolol slows heart rate too much Call the office We would change to cardura/doxazasin  When edarbi starts running out,  Call the office   Labwork:  No new labs needed  Testing/Procedures:  No further testing at this time   I recommend watching educational videos on topics of interest to you at:       [email or web address].goemmi.com  Enter code: HEARTCARE    Follow-Up: It was a pleasure seeing you in the office today. Please call us if you have new issues that need to be addressed before your next appt.  [phone]  Your physician wants you to follow-up in: 6 months.  You will receive a reminder letter in the mail two months in advance. If you don't receive a letter, please call our office to schedule the follow-up appointment.  If you need a refill on your cardiac medications before your next appointment, please call your pharmacy.

## 2016-03-06 ENCOUNTER — Telehealth: Payer: Self-pay | Admitting: Gastroenterology

## 2016-03-06 NOTE — Telephone Encounter (Signed)
[facility] his [facility]

## 2016-03-07 NOTE — Telephone Encounter (Signed)
Noted! Thank you

## 2016-03-12 ENCOUNTER — Ambulatory Visit
Admission: RE | Admit: 2016-03-12 | Payer: BLUE CROSS/BLUE SHIELD | Source: Ambulatory Visit | Admitting: Gastroenterology

## 2016-03-12 ENCOUNTER — Encounter: Admission: RE | Payer: Self-pay | Source: Ambulatory Visit

## 2016-03-12 SURGERY — COLONOSCOPY WITH PROPOFOL
Anesthesia: General

## 2016-04-12 ENCOUNTER — Telehealth: Payer: Self-pay | Admitting: Cardiovascular Disease

## 2016-04-12 NOTE — Telephone Encounter (Signed)
Patient calling the office for samples of medication:   1.  What medication and dosage are you requesting samples for? Edarbi  2.  Are you currently out of this medication?  Has a few left

## 2016-04-15 NOTE — Telephone Encounter (Signed)
Please advise if ok to give sample. Thanks.

## 2016-04-15 NOTE — Telephone Encounter (Signed)
Absolutely!  If we have samples, he can certainly have them.  This med is quite expensive and we don't have patient assistance for it.

## 2016-04-15 NOTE — Telephone Encounter (Signed)
Placed samples for Edarbi 80mg  (5 packs) at the front office for pick up. Notified pt.  Lot: [id number] exp: May 2018

## 2016-06-06 ENCOUNTER — Telehealth: Payer: Self-pay | Admitting: Cardiovascular Disease

## 2016-06-06 NOTE — Telephone Encounter (Signed)
Patient calling the office for samples of medication:   1.  What medication and dosage are you requesting samples for? Edarbi 80 mg   2.  Are you currently out of this medication?   Had a few left   Pt states this is about 700 a bottle  He is not able to do this every month Would like some advise on this as well  Please advise

## 2016-06-06 NOTE — Telephone Encounter (Signed)
Will you please advise about the cost of this medication.   We do have samples and I will place those up front for him to pick up.

## 2016-06-06 NOTE — Telephone Encounter (Signed)
I am not sure if he has insurance Can we research if there is patient assistance for the medication This med works well for him

## 2016-06-07 ENCOUNTER — Other Ambulatory Visit: Payer: Self-pay

## 2016-06-07 MED ORDER — AZILSARTAN MEDOXOMIL 80 MG PO TABS: 80.0000 mg | ORAL_TABLET | Freq: Every day | ORAL | 3 refills | Status: DC

## 2016-06-07 NOTE — Telephone Encounter (Signed)
Spoke w/ pt.  He has picked up samples of Edarbi that were left at the front desk for him. Advised him that I am mailing PAP to him to see if will qualify.  He is appreciative and states that he believes he will qualify. Asked him to call back if we can be of further assistance.

## 2016-07-17 ENCOUNTER — Telehealth: Payer: Self-pay | Admitting: Cardiovascular Disease

## 2016-07-17 NOTE — Telephone Encounter (Signed)
Pt calling stating he was told to call our office to pick up medication that was sent to our office for his Edarbi  Please advise.

## 2016-07-17 NOTE — Telephone Encounter (Signed)
Patient was notified the patient assistance Edarbi 80 mg is available to pick up.  Edarbi 80 mg Lot# [id number] Exp. 02/2020.

## 2016-08-23 ENCOUNTER — Ambulatory Visit (INDEPENDENT_AMBULATORY_CARE_PROVIDER_SITE_OTHER): Payer: BLUE CROSS/BLUE SHIELD | Admitting: Cardiovascular Disease

## 2016-08-23 ENCOUNTER — Encounter: Payer: Self-pay | Admitting: Cardiovascular Disease

## 2016-08-23 VITALS — BP 140/85 | HR 59 | Ht 72.0 in | Wt 329.0 lb

## 2016-08-23 DIAGNOSIS — R55 Syncope and collapse: Secondary | ICD-10-CM | POA: Diagnosis not present

## 2016-08-23 DIAGNOSIS — R011 Cardiac murmur, unspecified: Secondary | ICD-10-CM | POA: Diagnosis not present

## 2016-08-23 NOTE — Progress Notes (Signed)
Cardiology Office Note  Date:  08/23/2016   ID:  Burgess EstelleLeonard K Chewning, [name] June 27, 1960, MRN [id number]  PCP:  Derwood KaplanEason, Ernest B, MD   Chief Complaint  Patient presents with  . OTHER    6 month f/u no complaints today. Meds reviewed verbally with pt.    HPI:  Mr. Dionne MiloLeonard Shoemaker is a very pleasant 56 year old gentleman with a history of hypertension, lightheadedness, obesity, history of near syncope He presents for follow-up of his hypertension  In follow-up today he reports that he is doing well, [place]  Blood pressure at home 120s, generally well controlled Saw Dr. Maryellen Pileeason yesterday, was running high Elevated on today's visit as well initially, improved on recheck Denies any further eye pain as he had on his previous clinic visit Receives his  Raynelle Charydarbi through company assistance Denies any significant chest pain Goes to the gym, [employer] frequently  Otherwise he reports that he feels well with no complaints  EKG personally reviewed by myself on todays visit Shows normal sinus rhythm with rate 59 bpm no significant ST or T-wave changes  Other past medical history reviewed Patient previously started on metoprolol succinate 200 mg daily by Dr. Maryellen PileEason Felt overmedicated, very fatigued Cut this down on his own to 100 mg daily  total chol 184, LDL 119, HBA1C 5.4 Nonsmoker, no diabetes  3 episodes of near syncope on his last clinic visit Previously Cardizem was held and he was changed to amlodipine, HCTZ, lisinopril   Symptoms of near syncope present at rest while he is sitting lasting for 4-5 seconds at a time described as a severe lightheadedness. Wife checked his blood pressure after one episode and he reports having systolic pressure 140 Typically blood pressure at home running 120s up to 140 with diastolic 80s  He reports previously having carotid ultrasound which showed no significant disease Prior CT scan of the head  total cholesterol 183, LDL  127, normal basic metabolic panel  October 2013, he had positional vertigo described as a spinning when he sat up, better when he laid down. He went to the emergency room, had a CT scan and was told that he may have had an old stroke. Carotid ultrasound per his report showed no significant stenoses. He was seen by ear nose throat for his vertigo.  Echocardiogram October 2013 documented ejection fraction 60%, normal wall motion. No mention of any valvular disease  Previous symptoms on simvastatin  Treadmill nuclear stress test every 2006. He was told that there is no significant blockage. Exercised well on the treadmill   PMH:   has a past medical history of CVA (cerebral infarction); Heart murmur; Hyperlipidemia; Hypertension; and Vertigo.  PSH:    Past Surgical History:  Procedure Laterality Date  . APPENDECTOMY      Current Outpatient Prescriptions  Medication Sig Dispense Refill  . aspirin EC 81 MG tablet Take 1 tablet (81 mg total) by mouth daily. 90 tablet 3  . Azilsartan Medoxomil (EDARBI) 80 MG TABS Take 1 tablet (80 mg total) by mouth daily. 90 tablet 3  . diltiazem (TIAZAC) 360 MG 24 hr capsule Take 360 mg by mouth daily.    . meloxicam (MOBIC) 15 MG tablet TK ONE T PO QD  3  . metoprolol succinate (TOPROL-XL) 50 MG 24 hr tablet Take 1 tablet (50 mg total) by mouth 2 (two) times daily. Take with or immediately following a meal. (Patient taking differently: Take 125 mg by mouth daily. Take with  or immediately following a meal. ) 180 tablet 3  . Plecanatide (TRULANCE) 3 MG TABS Take by mouth daily.     No current facility-administered medications for this visit.      Allergies:   Hydralazine   Social History:  The patient  reports that he has never smoked. His smokeless tobacco use includes Chew. He reports that he does not drink alcohol or use drugs.   Family History:   Family history is unknown by patient.    Review of Systems: Review of Systems  Constitutional:  Negative.   Respiratory: Negative.   Cardiovascular: Negative.   Gastrointestinal: Negative.   Musculoskeletal: Negative.   Neurological: Positive for tremors.  Psychiatric/Behavioral: Negative.   All other systems reviewed and are negative.    PHYSICAL EXAM: VS:  BP (!) 150/92 (BP Location: Left Arm, Patient Position: Sitting, Cuff Size: Large)   Pulse (!) 59   Ht 6' (1.829 m)   Wt (!) 329 lb (149.2 kg)   BMI 44.62 kg/m  , BMI Body mass index is 44.62 kg/m. GEN: Well nourished, well developed, in no acute distress  HEENT: normal  Neck: no JVD, carotid bruits, or masses Cardiac: RRR; no murmurs, rubs, or gallops,no edema  Respiratory:  clear to auscultation bilaterally, normal work of breathing GI: soft, nontender, nondistended, + BS MS: no deformity or atrophy  Skin: warm and dry, no rash Neuro:  Strength and sensation are intact, tremor noted in his hands Psych: euthymic mood, full affect    Recent Labs: 02/03/2016: BUN 14; Creatinine, Ser 1.29; Hemoglobin 15.0; Platelets 209; Potassium 3.4; Sodium 137    Lipid Panel No results found for: CHOL, HDL, LDLCALC, TRIG   I pain Wt Readings from Last 3 Encounters:  08/23/16 (!) 329 lb (149.2 kg)  02/05/16 (!) 320 lb (145.2 kg)  02/03/16 (!) 319 lb (144.7 kg)       ASSESSMENT AND PLAN:  Essential hypertension Blood pressure is well controlled on today's visit. No changes made to the medications.  Morbid obesity (HCC) We have encouraged continued exercise, careful diet management in an effort to lose weight.  Near syncope Denies any further episodes , no further workup at this time   Total encounter time more than 15 minutes  Greater than 50% was spent in counseling and coordination of care with the patient   Disposition:   F/U  12 months     Signed, Dossie Arbour, M.D., Ph.D. 08/23/2016  [facility] [place], [place] [phone]

## 2016-08-23 NOTE — Patient Instructions (Signed)

## 2016-10-11 ENCOUNTER — Telehealth: Payer: Self-pay

## 2016-10-11 NOTE — Telephone Encounter (Signed)
Patient notified Edarbi 80 mg is available to pick up. Edarbi Lot# [id number] Exp. 07/2020.  Patient will pick up on Monday, Aug. 13, 2018.

## 2016-10-22 ENCOUNTER — Ambulatory Visit (INDEPENDENT_AMBULATORY_CARE_PROVIDER_SITE_OTHER): Payer: BLUE CROSS/BLUE SHIELD | Admitting: General Surgery

## 2016-10-22 ENCOUNTER — Encounter: Payer: Self-pay | Admitting: General Surgery

## 2016-10-22 VITALS — BP 154/92 | HR 62 | Resp 16 | Ht 72.0 in | Wt 321.0 lb

## 2016-10-22 DIAGNOSIS — Z1211 Encounter for screening for malignant neoplasm of colon: Secondary | ICD-10-CM | POA: Insufficient documentation

## 2016-10-22 MED ORDER — POLYETHYLENE GLYCOL 3350 17 GM/SCOOP PO POWD: 1.0000 | Freq: Once | ORAL | 0 refills | Status: AC

## 2016-10-22 NOTE — Patient Instructions (Addendum)
Colonoscopy, Adult A colonoscopy is an exam to look at the entire large intestine. During the exam, a lubricated, bendable tube is inserted into the anus and then passed into the rectum, colon, and other parts of the large intestine. A colonoscopy is often done as a part of normal colorectal screening or in response to certain symptoms, such as anemia, persistent diarrhea, abdominal pain, and blood in the stool. The exam can help screen for and diagnose medical problems, including:  Tumors.  Polyps.  Inflammation.  Areas of bleeding.  Tell a health care provider about:  Any allergies you have.  All medicines you are taking, including vitamins, herbs, eye drops, creams, and over-the-counter medicines.  Any problems you or family members have had with anesthetic medicines.  Any blood disorders you have.  Any surgeries you have had.  Any medical conditions you have.  Any problems you have had passing stool. What are the risks? Generally, this is a safe procedure. However, problems may occur, including:  Bleeding.  A tear in the intestine.  A reaction to medicines given during the exam.  Infection (rare).  What happens before the procedure? Eating and drinking restrictions Follow instructions from your health care provider about eating and drinking, which may include:  A few days before the procedure - follow a low-fiber diet. Avoid nuts, seeds, dried fruit, raw fruits, and vegetables.  1-3 days before the procedure - follow a clear liquid diet. Drink only clear liquids, such as clear broth or bouillon, black coffee or tea, clear juice, clear soft drinks or sports drinks, gelatin dessert, and popsicles. Avoid any liquids that contain red or purple dye.  On the day of the procedure - do not eat or drink anything during the 2 hours before the procedure, or within the time period that your health care provider recommends.  Bowel prep If you were prescribed an oral bowel prep  to clean out your colon:  Take it as told by your health care provider. Starting the day before your procedure, you will need to drink a large amount of medicated liquid. The liquid will cause you to have multiple loose stools until your stool is almost clear or light green.  If your skin or anus gets irritated from diarrhea, you may use these to relieve the irritation: ? Medicated wipes, such as adult wet wipes with aloe and vitamin E. ? A skin soothing-product like petroleum jelly.  If you vomit while drinking the bowel prep, take a break for up to 60 minutes and then begin the bowel prep again. If vomiting continues and you cannot take the bowel prep without vomiting, call your health care provider.  General instructions  Ask your health care provider about changing or stopping your regular medicines. This is especially important if you are taking diabetes medicines or blood thinners.  Plan to have someone take you home from the hospital or clinic. What happens during the procedure?  An IV tube may be inserted into one of your veins.  You will be given medicine to help you relax (sedative).  To reduce your risk of infection: ? Your health care team will wash or sanitize their hands. ? Your anal area will be washed with soap.  You will be asked to lie on your side with your knees bent.  Your health care provider will lubricate a long, thin, flexible tube. The tube will have a camera and a light on the end.  The tube will be inserted into your   anus.  The tube will be gently eased through your rectum and colon.  Air will be delivered into your colon to keep it open. You may feel some pressure or cramping.  The camera will be used to take images during the procedure.  A small tissue sample may be removed from your body to be examined under a microscope (biopsy). If any potential problems are found, the tissue will be sent to a lab for testing.  If small polyps are found, your  health care provider may remove them and have them checked for cancer cells.  The tube that was inserted into your anus will be slowly removed. The procedure may vary among health care providers and hospitals. What happens after the procedure?  Your blood pressure, heart rate, breathing rate, and blood oxygen level will be monitored until the medicines you were given have worn off.  Do not drive for 24 hours after the exam.  You may have a small amount of blood in your stool.  You may pass gas and have mild abdominal cramping or bloating due to the air that was used to inflate your colon during the exam.  It is up to you to get the results of your procedure. Ask your health care provider, or the department performing the procedure, when your results will be ready. This information is not intended to replace advice given to you by your health care provider. Make sure you discuss any questions you have with your health care provider. Document Released: 02/16/2000 Document Revised: 12/20/2015 Document Reviewed: 05/02/2015 Elsevier Interactive Patient Education  [employer].  The patient is scheduled for a Colonoscopy at [facility] on 12/31/16. They are aware to call the day before to get their arrival time. He may continue his 81 mg Aspirin. He will only take his blood pressure medications the morning of by 8:00 am. Miralax prescription has been sent into the patient's pharmacy. The patient is aware of date and instructions.

## 2016-10-22 NOTE — Progress Notes (Signed)
Patient ID: Trevor Flores, male   [name]: [birth year], 56 y.o.   MRN: [id number]  Chief Complaint  Patient presents with  . Colonoscopy    HPI Trevor CHOINSKI is a 56 y.o. male. Who presents for a colonoscopy discussion referred by Dr Maryellen Pile, none prior.  Denies any gastrointestinal issues. Bowels move regular and no recent bleeding noted. Hemorrhoid bleeding was 2 months ago. He does admit to occasional constipation, bm 3 times a week. He does take linzess about once a week. He is here with wife, Trevor Flores. 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  HPI  Past Medical History:  Diagnosis Date  . CVA (cerebral infarction)   . Heart murmur   . Hyperlipidemia   . Hypertension   . Vertigo     Past Surgical History:  Procedure Laterality Date  . APPENDECTOMY    . HEMORRHOID BANDING  ?   Dr Maryruth Bun    Family History  Problem Relation Age of Onset  . Colon cancer Neg Hx     Social History Social History  Substance Use Topics  . Smoking status: Never Smoker  . Smokeless tobacco: Current User    Types: Chew  . Alcohol use No    Allergies  Allergen Reactions  . Hydralazine Palpitations    Current Outpatient Prescriptions  Medication Sig Dispense Refill  . aspirin EC 81 MG tablet Take 1 tablet (81 mg total) by mouth daily. 90 tablet 3  . Azilsartan Medoxomil (EDARBI) 80 MG TABS Take 1 tablet (80 mg total) by mouth daily. 90 tablet 3  . diltiazem (TIAZAC) 360 MG 24 hr capsule Take 360 mg by mouth daily.    . Linaclotide (LINZESS PO) Take by mouth as needed.    . meloxicam (MOBIC) 15 MG tablet TK ONE T PO QD as needed  3  . metoprolol succinate (TOPROL-XL) 100 MG 24 hr tablet Take 100 mg by mouth daily. Take with or immediately following a meal.    . polyethylene glycol powder (GLYCOLAX/MIRALAX) powder Take 255 g by mouth once. Mix whole container with 64 ounces of clear liquids 255 g 0   No current facility-administered medications  for this visit.     Review of Systems Review of Systems  Constitutional: Negative.   Respiratory: Negative.   Cardiovascular: Negative.   Gastrointestinal: Positive for constipation. Negative for abdominal pain.    Blood pressure (!) 154/92, pulse 62, resp. rate 16, height 6' (1.829 m), weight (!) 321 lb (145.6 kg).  Physical Exam Physical Exam  Constitutional: He is oriented to person, place, and time. He appears well-developed and well-nourished.  HENT:  Mouth/Throat: Oropharynx is clear and moist.  Eyes: Conjunctivae are normal. No scleral icterus.  Neck: Neck supple.  Cardiovascular: Normal rate, regular rhythm and normal heart sounds.   Pulmonary/Chest: Effort normal and breath sounds normal.  Abdominal: Soft. There is no tenderness.  Lymphadenopathy:    He has no cervical adenopathy.  Neurological: He is alert and oriented to person, place, and time.  Skin: Skin is warm and dry.  Psychiatric: His behavior is normal.    Data Reviewed 02/03/2016 CBC showed a hemoglobin of 15.0 with an MCV of 83.8. White blood cell count 5900. Platelet count 209,000. Basic metabolic panel of the same date showed a potassium of 3.4. Creatinine 1.29 with an estimated GFR greater than 60.  Assessment    Candidate for screening colonoscopy.    Plan  Colonoscopy with possible biopsy/polypectomy prn: Information regarding the procedure, including its potential risks and complications (including but not limited to perforation of the bowel, which may require emergency surgery to repair, and bleeding) was verbally given to the patient. Educational information regarding lower intestinal endoscopy was given to the patient. Written instructions for how to complete the bowel prep using Miralax were provided. The importance of drinking ample fluids to avoid dehydration as a result of the prep emphasized.  HPI, Physical Exam, Assessment and Plan have been scribed under the direction and in  the presence of Earline Mayotte, MD. Dorathy Daft, RN  I have completed the exam and reviewed the above documentation for accuracy and completeness.  I agree with the above.  Museum/gallery conservator has been used and any errors in dictation or transcription are unintentional.  Donnalee Curry, M.D., F.A.C.S.  The patient is scheduled for a Colonoscopy at [facility] on 12/31/16. They are aware to call the day before to get their arrival time. He may continue his 81 mg Aspirin. He will only take his blood pressure medications the morning of by 8:00 am. Miralax prescription has been sent into the patient's pharmacy. The patient is aware of date and instructions.  Documented by Sinda Du LPN   Earline Mayotte 10/22/2016, 9:14 PM

## 2016-12-09 ENCOUNTER — Telehealth: Payer: Self-pay | Admitting: *Deleted

## 2016-12-09 NOTE — Telephone Encounter (Signed)
Patient's wife contacted the office to cancel colonoscopy for 12-31-16. She states the patient is going out of town.   They wish to reschedule for January 2019.  Patient's wife requests that we call her to arrange once the January schedule is available.   Endoscopy has been notified of cancellation.

## 2016-12-18 NOTE — Telephone Encounter (Signed)
Message left for wife, Wilnette Kaleshelma, to call the office back.  We now have January 2019 schedule available and can get patient's colonoscopy rescheduled.   Also, need to see if patient's insurance will be the same starting 03-04-17.

## 2016-12-18 NOTE — Telephone Encounter (Signed)
Colonoscopy has been rescheduled to 02-11-17 at [facility].   Colonoscopy instructions have been mailed to the patient.

## 2016-12-24 ENCOUNTER — Telehealth: Payer: Self-pay

## 2016-12-24 NOTE — Telephone Encounter (Signed)
Patient's wife called and he would like to reschedule his colonoscopy from 02/11/17 to 03/12/17. Instructions reviewed with patient and they are aware to call the day before to get his arrival time. Trish in Endoscopy notified of change.

## 2016-12-31 ENCOUNTER — Ambulatory Visit: Admit: 2016-12-31 | Payer: BLUE CROSS/BLUE SHIELD | Admitting: General Surgery

## 2016-12-31 SURGERY — COLONOSCOPY WITH PROPOFOL
Anesthesia: General

## 2017-01-10 ENCOUNTER — Telehealth: Payer: Self-pay

## 2017-01-10 NOTE — Telephone Encounter (Signed)
Patient notified the Edarbi 80 mg 3 bottles of 30 tablets in each with Lot # [id number] Exp. 10/2020 is available to pick up.  Patient will pick up today or on Monday.

## 2017-02-14 ENCOUNTER — Other Ambulatory Visit: Payer: Self-pay | Admitting: Cardiovascular Disease

## 2017-02-26 ENCOUNTER — Other Ambulatory Visit: Payer: Self-pay | Admitting: General Surgery

## 2017-02-26 DIAGNOSIS — Z1211 Encounter for screening for malignant neoplasm of colon: Secondary | ICD-10-CM

## 2017-02-28 ENCOUNTER — Telehealth: Payer: Self-pay | Admitting: Cardiovascular Disease

## 2017-02-28 NOTE — Telephone Encounter (Signed)
Pam, Do you happen to have this patients, patient assistance forms from last year we could refax to see if he can be reapproved?

## 2017-02-28 NOTE — Telephone Encounter (Signed)
Pt calling asking if we can please redo his forms for Raynelle Charydarbi  He states his year of free medication with the company is almost up  And was advised by them to call us so we may go ahead and work on paperwork  So it will restart this year  Please advise.

## 2017-02-28 NOTE — Telephone Encounter (Signed)
These forms are not kept on file after approval and he would need new application and forms completed with updated information.

## 2017-03-05 ENCOUNTER — Encounter: Payer: Self-pay | Admitting: *Deleted

## 2017-03-05 NOTE — Telephone Encounter (Signed)
Patient returned call.  Let patient know I have left patient assistant form up front for him to pick up. Also let patient know if he filled it out and brought it back we would fax for him

## 2017-03-05 NOTE — Telephone Encounter (Signed)
Left message for patient to give us a call back.  

## 2017-03-07 ENCOUNTER — Telehealth: Payer: Self-pay

## 2017-03-07 NOTE — Telephone Encounter (Signed)
Patient called to cancel his colonoscopy for now. His insurance will be changing and he will reschedule once he has his information. He was instructed to bring us a copy of his new insurance once he rescheduled so we can get pre certification. Endoscopy is aware of the cancellation.

## 2017-03-12 ENCOUNTER — Ambulatory Visit
Admission: RE | Admit: 2017-03-12 | Payer: BLUE CROSS/BLUE SHIELD | Source: Ambulatory Visit | Admitting: General Surgery

## 2017-03-12 ENCOUNTER — Encounter: Admission: RE | Payer: Self-pay | Source: Ambulatory Visit

## 2017-03-12 SURGERY — COLONOSCOPY WITH PROPOFOL
Anesthesia: General

## 2017-03-28 ENCOUNTER — Telehealth: Payer: Self-pay | Admitting: Cardiovascular Disease

## 2017-03-28 NOTE — Telephone Encounter (Signed)
Patient dropped off medication assistance forms . Placed in nurse box.  °

## 2017-03-31 NOTE — Telephone Encounter (Signed)
Given to Desma MaximPam Allen, RN

## 2017-03-31 NOTE — Telephone Encounter (Signed)
Spoke with patient and reviewed that I received his paperwork and one page was incorrect. Requested that he stop by when possible to sign new set of papers. He verbalized understanding and reports that he will come by either today or tomorrow.

## 2017-04-01 MED ORDER — AZILSARTAN MEDOXOMIL 80 MG PO TABS: 80.0000 mg | ORAL_TABLET | Freq: Every day | ORAL | 3 refills | Status: DC

## 2017-04-01 NOTE — Addendum Note (Signed)
Addended by: Bryna ColanderALLEN, PAMELA S on: 04/01/2017 02:40 PM   Modules accepted: Orders

## 2017-04-01 NOTE — Telephone Encounter (Addendum)
Spoke with patient and reviewed that per representative if we send medication to [facility] there is no need to complete paperwork. Advised that I would save application for assistance (placed above Pamela's desk) and submit prescription to company on brochure. He was appreciative for the call with no further questions at this time. Requested for patient to call back next week if he has not heard from them. He was agreeable with no concerns.

## 2017-04-11 ENCOUNTER — Telehealth: Payer: Self-pay | Admitting: Cardiovascular Disease

## 2017-04-11 NOTE — Telephone Encounter (Signed)
Pt calling to see if we received his medication Raynelle Charydarbi  He is wanting to know so he may come by and pick it up  Please call back

## 2017-04-11 NOTE — Telephone Encounter (Signed)
Trevor Flores was received in the office for Mr.Cosens. I have notified him of this and that it will be at the front desk for pick up next week. He voices understanding.

## 2017-05-12 ENCOUNTER — Other Ambulatory Visit: Payer: Self-pay | Admitting: *Deleted

## 2017-05-12 ENCOUNTER — Other Ambulatory Visit: Payer: Self-pay | Admitting: Cardiovascular Disease

## 2017-05-12 MED ORDER — METOPROLOL SUCCINATE ER 100 MG PO TB24: 100.0000 mg | ORAL_TABLET | Freq: Every day | ORAL | 3 refills | Status: DC

## 2017-05-12 NOTE — Telephone Encounter (Signed)
Please advise which dose to refill for Metoprolol Succinate.  Per last visit with Dr. Mariah MillingGollan patient was taking Metoprolol Succinate 50MG  BID.  Looks like Henry ScheinJeffrey Byrnett increased Metoprolol Succinate to 100MG  Daily.  Both the 50MG  and the 100MG  are currently on patients medication list.

## 2017-06-06 NOTE — Telephone Encounter (Signed)
Looks like medication list was updated and Metoprolol 100MG  was sent in.   Prescribing Provider Encounter Provider  OmenaGollan, Tollie Pizzaimothy J, MD Bryna ColanderAllen, Pamela S, RN  Outpatient Medication Detail    Disp Refills Start End   metoprolol succinate (TOPROL-XL) 100 MG 24 hr tablet 90 tablet 3 05/12/2017    Sig - Route: Take 1 tablet (100 mg total) by mouth daily. Take with or immediately following a meal. - Oral   Sent to pharmacy as: metoprolol succinate (TOPROL-XL) 100 MG 24 hr tablet   E-Prescribing Status: Receipt confirmed by pharmacy (05/12/2017 11:15 AM EDT)   Pharmacy   [facility] [phone] - [place], [place] - [address] [facility] [address] [facility]

## 2017-08-11 ENCOUNTER — Ambulatory Visit: Payer: BLUE CROSS/BLUE SHIELD | Admitting: Cardiovascular Disease

## 2017-08-11 ENCOUNTER — Encounter: Payer: Self-pay | Admitting: Cardiovascular Disease

## 2017-08-11 VITALS — BP 130/98 | HR 66 | Ht 72.0 in | Wt 308.0 lb

## 2017-08-11 DIAGNOSIS — R079 Chest pain, unspecified: Secondary | ICD-10-CM | POA: Diagnosis not present

## 2017-08-11 DIAGNOSIS — R55 Syncope and collapse: Secondary | ICD-10-CM | POA: Diagnosis not present

## 2017-08-11 DIAGNOSIS — I10 Essential (primary) hypertension: Secondary | ICD-10-CM

## 2017-08-11 MED ORDER — MECLIZINE HCL 25 MG PO TABS: 25.0000 mg | ORAL_TABLET | Freq: Three times a day (TID) | ORAL | 1 refills | Status: DC | PRN

## 2017-08-11 NOTE — Patient Instructions (Signed)

## 2017-08-11 NOTE — Progress Notes (Signed)
Cardiology Office Note  Date:  08/11/2017   ID:  Trevor Flores, [name] 28-Nov-1960, MRN [id number]  PCP:  Marcello Fennel,   Chief Complaint  Patient presents with  . other    12 month follow up. Meds reviewed by the pt. verbally. "doing well."    HPI:  [name] is a very pleasant 57 year old gentleman with a history of hypertension,  lightheadedness,  obesity,  near syncope He presents for follow-up of his hypertension, near syncope   he reports having terrible problems with his joints Hot and swollen Many of them treated with cortisone with improvement He thinks his symptoms are secondary to gout Orthopedics has placed an order for uric acid lab work  Blood pressure elevated today Reports that at home blood pressure typically well controlled, 120 to 140 systolic Previous Dizzy episode, vertigo. Was prescribed meclizine  Quit  part-time work in the [employer]  Currently takes metoprolol 50 in the morning and 25 at night  Denies any further eye pain as he had on his previous clinic visit  Edarbi through company assistance Denies any significant chest pain, no significant shortness of breath Goes to [employer] frequently, daily hen he cans depending on his joint pain  Otherwise he reports that he feels well with no complaints  EKG personally reviewed by myself on todays visit Shows normal sinus rhythm with rate 66 bpm no significant ST or T-wave changes  Other past medical history reviewed Patient previously started on metoprolol succinate 200 mg daily by Dr. Maryellen Pile Felt overmedicated, very fatigued Cut this down on his own to 100 mg daily  total chol 184, LDL 119, HBA1C 5.4 Nonsmoker, no diabetes  3 episodes of near syncope on his last clinic visit Previously Cardizem was held and he was changed to amlodipine, HCTZ, lisinopril   Symptoms of near syncope present at rest while he is sitting lasting for 4-5 seconds at a time described as a severe  lightheadedness. Wife checked his blood pressure after one episode and he reports having systolic pressure 140 Typically blood pressure at home running 120s up to 140 with diastolic 80s  He reports previously having carotid ultrasound which showed no significant disease Prior CT scan of the head  total cholesterol 183, LDL 127, normal basic metabolic panel  October 2013, he had positional vertigo described as a spinning when he sat up, better when he laid down. He went to the emergency room, had a CT scan and was told that he may have had an old stroke. Carotid ultrasound per his report showed no significant stenoses. He was seen by ear nose throat for his vertigo.  Echocardiogram October 2013 documented ejection fraction 60%, normal wall motion. No mention of any valvular disease  Previous symptoms on simvastatin  Treadmill nuclear stress test every 2006. He was told that there is no significant blockage. Exercised well on the treadmill   PMH:   has a past medical history of CVA (cerebral infarction), Heart murmur, Hyperlipidemia, Hypertension, and Vertigo.  PSH:    Past Surgical History:  Procedure Laterality Date  . APPENDECTOMY    . HEMORRHOID BANDING  ?   Dr Maryruth Bun    Current Outpatient Medications  Medication Sig Dispense Refill  . aspirin EC 81 MG tablet Take 1 tablet (81 mg total) by mouth daily. 90 tablet 3  . Azilsartan Medoxomil (EDARBI) 80 MG TABS Take 1 tablet (80 mg total) by mouth daily. 90 tablet 3  . diltiazem (TIAZAC) 360  MG 24 hr capsule Take 360 mg by mouth daily.    . Linaclotide (LINZESS PO) Take by mouth as needed.    . meloxicam (MOBIC) 15 MG tablet TK ONE T PO QD as needed  3  . metoprolol succinate (TOPROL-XL) 50 MG 24 hr tablet Take 1 tablet (50 mg total) by mouth daily. 50 mg in the AM, 25 mg in the PM,Take with or immediately following a meal.    . meclizine (ANTIVERT) 25 MG tablet Take 1 tablet (25 mg total) by mouth 3 (three) times daily as  needed for dizziness. 30 tablet 1   No current facility-administered medications for this visit.      Allergies:   Other and Hydralazine   Social History:  The patient  reports that he has never smoked. His smokeless tobacco use includes chew. He reports that he does not drink alcohol or use drugs.   Family History:   family history is not on file.    Review of Systems: Review of Systems  Constitutional: Negative.   Respiratory: Negative.   Cardiovascular: Negative.   Gastrointestinal: Negative.   Musculoskeletal: Positive for joint pain.  Neurological: Positive for tremors.  Psychiatric/Behavioral: Negative.   All other systems reviewed and are negative.    PHYSICAL EXAM: VS:  BP (!) 130/98 (BP Location: Left Arm, Patient Position: Sitting, Cuff Size: Large)   Pulse 66   Ht 6' (1.829 m)   Wt (!) 308 lb (139.7 kg)   BMI 41.77 kg/m  , BMI Body mass index is 41.77 kg/m. Constitutional:  oriented to person, place, and time. No distress.  HENT:  Head: Normocephalic and atraumatic.  Eyes:  no discharge. No scleral icterus.  Neck: Normal range of motion. Neck supple. No JVD present.  Cardiovascular: Normal rate, regular rhythm, normal heart sounds and intact distal pulses. Exam reveals no gallop and no friction rub. No edema No murmur heard. Pulmonary/Chest: Effort normal and breath sounds normal. No stridor. No respiratory distress.  no wheezes.  no rales.  no tenderness.  Abdominal: Soft.  no distension.  no tenderness.  Musculoskeletal: Normal range of motion.  no  tenderness or deformity.  Neurological:  normal muscle tone. Coordination normal. No atrophy Skin: Skin is warm and dry. No rash noted. not diaphoretic.  Psychiatric:  normal mood and affect. behavior is normal. Thought content normal.    Recent Labs: No results found for requested labs within last 8760 hours.    Lipid Panel No results found for: CHOL, HDL, LDLCALC, TRIG   I pain Wt Readings from Last  3 Encounters:  08/11/17 (!) 308 lb (139.7 kg)  10/22/16 (!) 321 lb (145.6 kg)  08/23/16 (!) 329 lb (149.2 kg)       ASSESSMENT AND PLAN:  Essential hypertension Blood pressure elevated on today's visit but reports is well controlled at home Even elevated on repeat check by myself He will call our office if blood pressure continues to run high  Morbid obesity (HCC) We have encouraged continued exercise, careful diet management in an effort to lose weight. continues to go to the gym daily, limited by joint pain  Near syncope Denies any further episodes , no further workup at this time    Total encounter time more than 25 minutes  Greater than 50% was spent in counseling and coordination of care with the patient   Disposition:   F/U  12 months     Signed, Dossie Arbourim Danijah Noh, M.D., Ph.D. 08/11/2017  [facility]  [place], [place]

## 2017-08-26 ENCOUNTER — Telehealth: Payer: Self-pay | Admitting: Cardiovascular Disease

## 2017-08-26 NOTE — Telephone Encounter (Signed)
Pt states since the metoprolol has increased his pulse has been running 46-47. Please call to discuss. Pulse currently is 49, when he is up moving around it increases

## 2017-08-26 NOTE — Telephone Encounter (Signed)
S/w patient. His metoprolol was increased to 50 mg in the am and 25 mg in the evening on 07/08/17. Patient has noticed his HR has been decreased since increasing this. Usually, pulse is around 47-50's. It will increase to 70's with activity. He is concerned that his pulse is too low. BP at home runs in the 130's/80's now. Last OV on 08/11/17 with Dr Mariah MillingGollan HR 66, BP 130/98. Complains of some tiredness. Denies chest pain, shortness of breath, or dizziness. He has minimal dizziness at times. Advised patient I would send to provider to review.

## 2017-08-26 NOTE — Telephone Encounter (Signed)
If he's feeling sluggish @ that HR, then he may cut back down to 25 bid.  He'll need to follow bp closely @ home as if it rises, he will need adjustment of antihypertensives.

## 2017-08-27 MED ORDER — METOPROLOL SUCCINATE ER 25 MG PO TB24: 25.0000 mg | ORAL_TABLET | Freq: Two times a day (BID) | ORAL | 3 refills | Status: DC

## 2017-08-27 NOTE — Telephone Encounter (Signed)
Patient verbalized understanding to decrease metoprolol succinate to 25 mg two times a day and to continue to monitor BP/HR at home. He would like 25 mg tablets called to his pharmacy. Rx sent.

## 2017-11-21 ENCOUNTER — Telehealth: Payer: Self-pay | Admitting: Cardiovascular Disease

## 2017-11-21 NOTE — Telephone Encounter (Signed)
Patient c/o Palpitations:  High priority if patient c/o lightheadedness, shortness of breath, or chest pain  1) How long have you had palpitations/irregular HR/ Afib? Are you having the symptoms now? Started last night intermittent   2) Are you currently experiencing lightheadedness, SOB or CP? No   3) Do you have a history of afib (atrial fibrillation) or irregular heart rhythm? Yes feels fluttering   4) Have you checked your BP or HR? (document readings if available): HR 62 BP 140-89   5) Are you experiencing any other symptoms? No   Patient prefers to be seen in office instead of emergency room please call to advise

## 2017-11-21 NOTE — Telephone Encounter (Signed)
Called patient. He's been experiencing fluttering that started last night. It was happening every 15-30 minutes and would last barely a second. Denies chest pain, shortness of breath, dizziness, left arm pain, jaw pain or swelling. Patient is taking medications as listed.  He started taking a 12-day prednisone taper and today is his 3rd day of it for [facility]. Advised patient that he should call PCP or contact prescribing physician of the prednisone to see if they think this could be the cause. Patient agreed and said he would go on over there now. Pt verbalized understanding to call 911 or go to the emergency room, if he develops any new or worsening symptoms.

## 2017-11-21 NOTE — Telephone Encounter (Signed)
Patient calling  States the he was told by other office he should make an appointment with Dr. Mariah MillingGollan Patient declined next available on 10/24 with R. Dunn, would like sooner  Would like to discuss with nurse Please call to discuss

## 2017-11-21 NOTE — Telephone Encounter (Signed)
Patient was seen at [facility] clinic today. They did EKG and CXR per patient and said he was stable. They gave him some lidocaine syrup to drink per patient. They advised him to call Dr Windell HummingbirdGollan's office to be seen next week. Scheduled patient to see Alycia RossettiRyan on 11/28/17 at 1330pm. He was appreciative and will go to ER if he develops new or worsening symptoms.

## 2017-11-26 NOTE — Telephone Encounter (Signed)
Patient unable to be seen at [facility] .  Rescheduled with APP and added to waitlist .

## 2017-11-28 ENCOUNTER — Ambulatory Visit: Payer: BLUE CROSS/BLUE SHIELD | Admitting: Physician Assistant

## 2017-12-02 ENCOUNTER — Ambulatory Visit: Payer: BLUE CROSS/BLUE SHIELD | Admitting: Nurse Practitioner

## 2017-12-02 ENCOUNTER — Encounter: Payer: Self-pay | Admitting: Nurse Practitioner

## 2017-12-02 ENCOUNTER — Ambulatory Visit (INDEPENDENT_AMBULATORY_CARE_PROVIDER_SITE_OTHER): Payer: BLUE CROSS/BLUE SHIELD

## 2017-12-02 VITALS — BP 160/100 | HR 63 | Ht 72.0 in | Wt 302.5 lb

## 2017-12-02 DIAGNOSIS — E782 Mixed hyperlipidemia: Secondary | ICD-10-CM

## 2017-12-02 DIAGNOSIS — R002 Palpitations: Secondary | ICD-10-CM | POA: Diagnosis not present

## 2017-12-02 DIAGNOSIS — I498 Other specified cardiac arrhythmias: Secondary | ICD-10-CM

## 2017-12-02 DIAGNOSIS — I10 Essential (primary) hypertension: Secondary | ICD-10-CM | POA: Diagnosis not present

## 2017-12-02 NOTE — Patient Instructions (Addendum)
Medication Instructions: - Your physician recommends that you continue on your current medications as directed. Please refer to the Current Medication list given to you today.  Samples given: Edarbi 40 mg tablets given- take 2 tablets (80 mg) once daily Lot: [id number]     Lot: [id number] Exp: 5/21         Exp: 4/21 # 2 bottles        # 2 bottles  Labwork: -  None ordered  Procedures/Testing: - Your physician has recommended that you wear a 14-day heart monitor (ZIO patch).  Follow-Up: - as scheduled with Dr. Mariah Milling on 01/05/18 @ 8:40 am   Any Additional Special Instructions Will Be Listed Below (If Applicable).     If you need a refill on your cardiac medications before your next appointment, please call your pharmacy.

## 2017-12-02 NOTE — Progress Notes (Signed)
Office Visit    Patient Name: Trevor Flores Date of Encounter: 12/02/2017  Primary Care Provider:  Josefina Do., MD Primary Cardiologist:  Julien Nordmann, MD  Chief Complaint    57 year old male with a history of hypertension, hyperlipidemia, chest pain, presyncope, resting tremor, and obesity, who presents for follow-up related to fluttering and palpitations.  Past Medical History    Past Medical History:  Diagnosis Date  . Heart murmur    a. 12/2011 Echo: EF 60%, no rwma.  . History of stress test    a. 04/2004 Ex MV: Ex time 10:30. Hypertensive response. No ischemia/infarct.  . Hyperlipidemia   . Hypertension   . Palpitations   . Possible Lacunar infarction (HCC)    a. 12/2011 Head CT: CSF density lateral to R cerebral peduncle - ? prior lacunar infarct.  . Pre-syncope    a. 06/2014 Monitor: Sinus rhythm. No significant arrhythmia.  Marland Kitchen Resting tremor   . Vertigo    Past Surgical History:  Procedure Laterality Date  . APPENDECTOMY    . HEMORRHOID BANDING  ?   Dr Maryruth Bun    Allergies  Allergies  Allergen Reactions  . Other   . Hydralazine Palpitations    History of Present Illness    57 year old male with the above past medical history including chest pain status post nonischemic stress testing in 2006, presyncope with normal monitoring in April 2016, hypertension, hyperlipidemia, obesity, and questionable history of prior lacunar infarct possibly noted on CT in October 2013.  He was last seen in clinic here in June, at which time he was doing well.  He says that on September 19, he was placed on prednisone and about 3 days later, he started to notice intermittent fluttering in the center of his chest.  He has trouble describing exactly what this felt like but just says it felt like a burning fluttering sensation at his lower sternum which would last about a second and resolve spontaneously.  He says he is not sure if it was palpitations or if he felt his  heart racing.  There was no chest pain, dyspnea, or presyncope.  He finished his prednisone taper a day later and noticed some improvement in occurrence of fluttering but has continued to have one episode about once every 3 to 4 days.  He recently reported this to his primary care provider and was advised to follow-up with cardiology.  His last episode was about 4 days ago and lasted just a few seconds.  This is not interfered with his ability to walk a mile a day at the [facility].  He is able to do this without any symptoms or limitations.  Home Medications    Prior to Admission medications   Medication Sig Start Date End Date Taking? Authorizing Provider  aspirin EC 81 MG tablet Take 1 tablet (81 mg total) by mouth daily. 06/13/14  Yes [provider]  Azilsartan Medoxomil (EDARBI) 80 MG TABS Take 1 tablet (80 mg total) by mouth daily. 04/01/17  Yes Gollan, Tollie Pizza, MD  diltiazem (TIAZAC) 360 MG 24 hr capsule Take 360 mg by mouth daily.   Yes [provider]  Linaclotide (LINZESS PO) Take by mouth as needed.   Yes [provider]  meclizine (ANTIVERT) 25 MG tablet Take 1 tablet (25 mg total) by mouth 3 (three) times daily as needed for dizziness. 08/11/17  Yes Gollan, Tollie Pizza, MD  meloxicam (MOBIC) 15 MG tablet TK ONE T PO  QD as needed 08/13/15  Yes [provider]  metoprolol succinate (TOPROL XL) 25 MG 24 hr tablet Take 1 tablet (25 mg total) by mouth 2 (two) times daily. 08/27/17  Yes Creig Hines, NP    Review of Systems    Fluttering in his chest which is very brief in nature, lasting just a second or so.  He denies chest pain, dyspnea, PND, orthopnea, dizziness, syncope, edema, or early satiety.  All other systems reviewed and are otherwise negative except as noted above.  Physical Exam    VS:  BP (!) 160/100 (BP Location: Left Arm, Patient Position: Sitting, Cuff Size: Large)   Pulse 63   Ht 6' (1.829 m)   Wt (!) 302 lb 8 oz (137.2 kg)    BMI 41.03 kg/m  , BMI Body mass index is 41.03 kg/m.  Repeat blood pressure 150/90 GEN: Obese, in no acute distress. HEENT: normal. Neck: Supple, no JVD, carotid bruits, or masses. Cardiac: RRR, no murmurs, rubs, or gallops. No clubbing, cyanosis, edema.  Radials/DP/PT 2+ and equal bilaterally.  Respiratory:  Respirations regular and unlabored, clear to auscultation bilaterally. GI: Soft, nontender, nondistended, BS + x 4. MS: no deformity or atrophy. Skin: warm and dry, no rash. Neuro:  Strength and sensation are intact. Psych: Normal affect.  Accessory Clinical Findings    ECG personally reviewed by me today -baseline artifact in the setting of resting tremor, regular sinus rhythm, 63, left axis deviation- no acute changes.  Assessment & Plan    1.  Heart fluttering: Patient has had periodic fluttering sensation in his lower chest, which she describes as somewhat of a burning sensation coming on quickly, lasting a second, and resolving spontaneously.  There are no associated symptoms.  As we discussed his symptoms in detail, he is not sure if he is feeling palpitations or not.  ECG today is without ectopy.  I will place a 2-week Zio monitor to assess for any potential arrhythmia.  Otherwise he remains on diltiazem and beta-blocker.  He recently had labs through primary care which were unremarkable.  2.  Essential hypertension: Blood pressure elevated today 160/100.  I repeated this and got 150/90.  He says he checks his blood pressure at home regularly and typically gets 130/80-85.  I advised him to continue to check his blood pressure daily and that if he is trending in the 140s, he should contact us as we would likely need to add an additional agent or switch him from diltiazem to amlodipine.  Of note, he pries a prior intolerance to hydralazine.  3.  Hyperlipidemia: LDL recently found to be 126.  He calculates out to a 22.7% risk of cardiovascular event in the next 10 years.  He has not  currently nor has ever been on a statin.  This is been managed by primary care.  I would have a low threshold to initiate statin therapy in this gentleman in the future.  We can discuss at follow-up.  4.  History of presyncope: No recurrence.  6.  Disposition: Follow-up 14-day ZIO monitor.  Follow-up in clinic in approximately 4 to 6 weeks or sooner if necessary.   Nicolasa Ducking, NP 12/02/2017, 4:29 PM

## 2017-12-03 ENCOUNTER — Ambulatory Visit: Payer: BLUE CROSS/BLUE SHIELD | Admitting: Nurse Practitioner

## 2018-01-01 NOTE — Progress Notes (Signed)
Cardiology Office Note  Date:  01/02/2018   ID:  Flores Trevor, [name] 19-Apr-1960, MRN [id number]  PCP:  Marcello Fennel,   Chief Complaint  Patient presents with  . other    Follow up from [facility] monitor. Meds reviewed by the pt. verbally. Pt. c/o fluttering in chest and shortness of breath at times.    HPI:  [name] is a very pleasant 57 year old gentleman with a history of hypertension,  lightheadedness,  obesity,  near syncope Tremor He presents for follow-up of his hypertension, near syncope  Recently seen by our clinic for palpitations, fluttering Monitor was ordered  Monitor showed no significant arrhythmia Results reviewed with him in detail today, pulled up in the office Patient had a min HR of 37 bpm, max HR of 132 bpm, and avg HR of 55 bpm. Predominant underlying rhythm was Sinus Rhythm. First Degree AV Block was present. Isolated SVEs were rare (<1.0%), SVE Couplets were rare (<1.0%), and no SVE Triplets were present. Isolated VEs were rare (<1.0%), and no VE Couplets or VE Triplets were present.  In follow-up today he occasionally has extra beats, not that many just anxious about from One happened while he was having sexual relations with his wife  Blood pressure at home  136/80 Medication compliant Versus exercise on a regular basis  Significant arthralgias in the past Improved Prior vertigo, not at this time  Quit  part-time work in the [employer] No chest pain or shortness of breath concerning for angina Lab work reviewed with him total cholesterol 170 range  EKG personally reviewed by myself on todays visit Shows normal sinus rhythm rate 71 bpm no significant ST-T wave changes, artifact from tremor  Other past medical history reviewed Patient previously started on metoprolol succinate 200 mg daily by Dr. Maryellen Pile Felt overmedicated, very fatigued Cut this down on his own to 100 mg daily  total chol 184, LDL 119, HBA1C 5.4 Nonsmoker, no  diabetes  3 episodes of near syncope on his last clinic visit Previously Cardizem was held and he was changed to amlodipine, HCTZ, lisinopril   Symptoms of near syncope present at rest while he is sitting lasting for 4-5 seconds at a time described as a severe lightheadedness. Wife checked his blood pressure after one episode and he reports having systolic pressure 140 Typically blood pressure at home running 120s up to 140 with diastolic 80s  He reports previously having carotid ultrasound which showed no significant disease Prior CT scan of the head  total cholesterol 183, LDL 127, normal basic metabolic panel  October 2013, he had positional vertigo described as a spinning when he sat up, better when he laid down. He went to the emergency room, had a CT scan and was told that he may have had an old stroke. Carotid ultrasound per his report showed no significant stenoses. He was seen by ear nose throat for his vertigo.  Echocardiogram October 2013 documented ejection fraction 60%, normal wall motion. No mention of any valvular disease  Previous symptoms on simvastatin  Treadmill nuclear stress test every 2006. He was told that there is no significant blockage. Exercised well on the treadmill   PMH:   has a past medical history of Heart murmur, History of stress test, Hyperlipidemia, Hypertension, Palpitations, Possible Lacunar infarction (HCC), Pre-syncope, Resting tremor, and Vertigo.  PSH:    Past Surgical History:  Procedure Laterality Date  . APPENDECTOMY    . HEMORRHOID BANDING  ?   Dr Maryruth Bun  Current Outpatient Medications  Medication Sig Dispense Refill  . aspirin EC 81 MG tablet Take 1 tablet (81 mg total) by mouth daily. 90 tablet 3  . Azilsartan Medoxomil (EDARBI) 80 MG TABS Take 1 tablet (80 mg total) by mouth daily. 90 tablet 3  . diltiazem (TIAZAC) 360 MG 24 hr capsule Take 360 mg by mouth daily.    . Linaclotide (LINZESS PO) Take by mouth as needed.     . meclizine (ANTIVERT) 25 MG tablet Take 1 tablet (25 mg total) by mouth 3 (three) times daily as needed for dizziness. 30 tablet 1  . meloxicam (MOBIC) 15 MG tablet TK ONE T PO QD as needed  3  . metoprolol succinate (TOPROL XL) 25 MG 24 hr tablet Take 1 tablet (25 mg total) by mouth 2 (two) times daily. 180 tablet 3   No current facility-administered medications for this visit.      Allergies:   Other and Hydralazine   Social History:  The patient  reports that he has never smoked. His smokeless tobacco use includes chew. He reports that he does not drink alcohol or use drugs.   Family History:   family history is not on file.    Review of Systems: Review of Systems  Constitutional: Negative.   Respiratory: Negative.   Cardiovascular: Positive for palpitations.  Gastrointestinal: Negative.   Musculoskeletal: Positive for joint pain.  Neurological: Positive for tremors.  Psychiatric/Behavioral: Negative.   All other systems reviewed and are negative.    PHYSICAL EXAM: VS:  BP (!) 160/90 (BP Location: Left Arm, Patient Position: Sitting, Cuff Size: Large)   Pulse 71   Ht 6' (1.829 m)   Wt (!) 303 lb 8 oz (137.7 kg)   BMI 41.16 kg/m  , BMI Body mass index is 41.16 kg/m. Constitutional:  oriented to person, place, and time. No distress.  HENT:  Head: Grossly normal Eyes:  no discharge. No scleral icterus.  Neck: No JVD, no carotid bruits  Cardiovascular: Regular rate and rhythm, no murmurs appreciated Pulmonary/Chest: Clear to auscultation bilaterally, no wheezes or rails Abdominal: Soft.  no distension.  no tenderness.  Musculoskeletal: Normal range of motion Neurological:  normal muscle tone. Coordination normal. No atrophy Skin: Skin warm and dry Psychiatric: normal affect, pleasant  Recent Labs: No results found for requested labs within last 8760 hours.    Lipid Panel No results found for: CHOL, HDL, LDLCALC, TRIG   I pain Wt Readings from Last 3  Encounters:  01/02/18 (!) 303 lb 8 oz (137.7 kg)  12/02/17 (!) 302 lb 8 oz (137.2 kg)  08/11/17 (!) 308 lb (139.7 kg)     ASSESSMENT AND PLAN:  Essential hypertension Blood pressure typically runs high in the office but reports it is well controlled at home in the 130 range Recommend he continue to closely monitor heart rate, bring his blood pressure cuff on his next visit  Morbid obesity (HCC) Weight trending down slowly, recommended low carbohydrate, continued exercise  Palpitations, ectopy APCs PVCs on monitor but no other significant arrhythmia Reassurance provided, he is very anxious about his palpitations Little room to add extra metoprolol at nighttime as he reports bradycardia heart rate 40 8 at night Recommend he try to take the metoprolol with dinnertime instead of morning He does have occasional fatigue in the daytime  Near syncope Denies any further episodes , no further workup at this time Stable    Total encounter time more than 25 minutes  Greater than  50% was spent in counseling and coordination of care with the patient   Disposition:   F/U  12 months     Signed, Dossie Arbour, M.D., Ph.D. 01/02/2018  [facility] [place], [place] [phone]

## 2018-01-02 ENCOUNTER — Encounter: Payer: Self-pay | Admitting: Cardiovascular Disease

## 2018-01-02 ENCOUNTER — Ambulatory Visit: Payer: BLUE CROSS/BLUE SHIELD | Admitting: Cardiovascular Disease

## 2018-01-02 DIAGNOSIS — E782 Mixed hyperlipidemia: Secondary | ICD-10-CM | POA: Diagnosis not present

## 2018-01-02 DIAGNOSIS — I10 Essential (primary) hypertension: Secondary | ICD-10-CM | POA: Diagnosis not present

## 2018-01-02 DIAGNOSIS — I498 Other specified cardiac arrhythmias: Secondary | ICD-10-CM

## 2018-01-02 DIAGNOSIS — R55 Syncope and collapse: Secondary | ICD-10-CM

## 2018-01-02 DIAGNOSIS — R002 Palpitations: Secondary | ICD-10-CM

## 2018-01-02 DIAGNOSIS — R079 Chest pain, unspecified: Secondary | ICD-10-CM

## 2018-01-02 MED ORDER — METOPROLOL SUCCINATE ER 50 MG PO TB24: 50.0000 mg | ORAL_TABLET | Freq: Every day | ORAL | 3 refills | Status: DC

## 2018-01-02 NOTE — Patient Instructions (Signed)

## 2018-01-05 ENCOUNTER — Ambulatory Visit: Payer: BLUE CROSS/BLUE SHIELD | Admitting: Cardiovascular Disease

## 2018-01-05 ENCOUNTER — Encounter

## 2018-01-21 ENCOUNTER — Other Ambulatory Visit: Payer: Self-pay | Admitting: Cardiovascular Disease

## 2018-03-17 ENCOUNTER — Telehealth: Payer: Self-pay | Admitting: Cardiovascular Disease

## 2018-03-17 NOTE — Telephone Encounter (Signed)
°  Pt c/o medication issue:  1. Name of Medication: Edarbi 80 mg po   2. How are you currently taking this medication (dosage and times per day)? 80 mg po q d   3. Are you having a reaction (difficulty breathing--STAT)?  No   4. What is your medication issue?  Patient calling to get assistance forms started for renewal assistance with Edarbi 80 mg po q d .

## 2018-03-18 NOTE — Telephone Encounter (Signed)
Left voicemail message for patient to call back.

## 2018-03-18 NOTE — Telephone Encounter (Signed)
Patient returning call.

## 2018-03-19 NOTE — Telephone Encounter (Signed)
Spoke with patient and reviewed that we do have patient assistance application available for this medication and also a medication management application that may be of some help. He then wanted to know if there is another option other than Edarbi due to cost. He also states that his heart rates run low due to being on metoprolol so if there is another option that would not lower heart rate as a option. Advised that I would place application and information up front for him to pick up and would send message to provider to see if there is another option due to cost. Let him know that I would give him a call back if there are any further recommendations. He was appreciative for the call with no further questions at this time.

## 2018-03-20 NOTE — Telephone Encounter (Signed)
Spoke with patient and he states that he picked up patient assistance application and will try to get that completed to see if he can get the other medication. He reports previously taking losartan and it did not work for him for some reason and does not want to try that again. Advised that he should be on something and to make sure and keep [place] updated if he would like [place] to send in something else. He verbalized understanding with no further questions at this time.

## 2018-03-20 NOTE — Telephone Encounter (Signed)
Re: Trevor Flores - ok to switch to losartan 100mg  daily. Re: metoprolol - he is also on a high dose of diltiazem.  Both could lower HR.  With a h/o palpitations, if we reduce the dose of either, he may end up with more palpitations.  He did have resting bradycardia on recent zio monitoring.  If asymptomatic, would cont both dilt and metoprolol at current doses in order to prevent palpitations.  If he'd like to see how he does on a 1/2 tab of metoprolol bid, he is welcome to try that - though palps may increase.

## 2018-04-16 NOTE — Telephone Encounter (Signed)
Patient dropped off assistance form with 2019 [id number]-[id number]   Placed in nurse box

## 2018-04-17 NOTE — Telephone Encounter (Signed)
Application received and will need completion by provider. Will work on this when provider returns.

## 2018-04-20 NOTE — Telephone Encounter (Signed)
Application completed, signed, and faxed to assistance program. Will file in samples closet.

## 2018-08-05 ENCOUNTER — Telehealth: Payer: Self-pay | Admitting: Cardiovascular Disease

## 2018-08-05 NOTE — Telephone Encounter (Signed)
Patient returned call

## 2018-08-05 NOTE — Telephone Encounter (Signed)
LMTCB re: Azilsartan samples

## 2018-08-05 NOTE — Telephone Encounter (Signed)
Pt would like samples of Eadarbi.

## 2018-08-05 NOTE — Telephone Encounter (Signed)
I spoke with the patient.  I advised him we do not currently have any samples of Edarbi. He states he received this free 1 year, then had a co-pay after that. He states he submitted his application for assistance back to the office earlier this year.  I advised I do see documentation where Pam, RN for Dr. Mariah Milling faxed this back to [facility]- [employer] patient assistance on 04/20/18. Patient is aware I will call [employer] to follow up and we will call him back once we know something more.  I called [employer] at [phone] to follow up on the patient's application. Per the rep there, they have no record of the application being received.  Strangely enough, they do not even recognize [place] as a drug they are familiar with. I confirmed there fax # was [phone]- this was correct. I also advised that Raynelle Chary is listed on the front of their application. They had no explanation for this.   Rep at [facility] gave me contact #'s for 1) [employer] Patient Assistance Program [phone]  2) [facility] [employer] [facility] [place]) [phone]  I went on-line for PAN and tried to enter the drug info- received a message there are no funds for this at this time.  I called [employer] at [phone] and there was an automated message stating they were no longer operational, to call [phone] for assistance.  I called [phone]- message stated this was for [facility] and to leave a message if we were in need of a patient assistance application- we would be called back within the hour.  I left a message to please call back.

## 2018-08-05 NOTE — Telephone Encounter (Signed)
Call received back from the pharmacy. Advised I call [facility] at [phone].  I attempted to call this number. I left a message to please call back.

## 2018-08-07 ENCOUNTER — Telehealth: Payer: Self-pay | Admitting: Cardiovascular Disease

## 2018-08-07 ENCOUNTER — Other Ambulatory Visit: Payer: Self-pay

## 2018-08-07 MED ORDER — EDARBI 80 MG PO TABS: 1.0000 | ORAL_TABLET | Freq: Every day | ORAL | 0 refills | Status: DC

## 2018-08-07 NOTE — Telephone Encounter (Signed)
Patient calling to check on status  Would like for a prescription to be sent in for now while waiting  Sending another message for refill

## 2018-08-07 NOTE — Telephone Encounter (Signed)
EDARBI 80 MG TABS 30 tablet 0 08/07/2018    Sig - Route: Take 1 tablet (80 mg total) by mouth daily. - Oral   Sent to pharmacy as: EDARBI 80 MG Tab   E-Prescribing Status: Receipt confirmed by pharmacy (08/07/2018 4:37 PM EDT)   Pharmacy   [id number] - [place], [place] - [address] AT [facility] OF [address] & [address] [facility]

## 2018-08-07 NOTE — Telephone Encounter (Signed)
°*  STAT* If patient is at the pharmacy, call can be transferred to refill team.   1. Which medications need to be refilled? (please list name of each medication and dose if known)  EDARBI 80 MG - 1 tablet daily   2. Which pharmacy/location (including street and city if local pharmacy) is medication to be sent to? [facility] in [place]   3. Do they need a 30 day or 90 day supply? 30 day or less   Patient would like to pick up this afternoon if possible

## 2018-08-10 NOTE — Telephone Encounter (Signed)
Dialed [employer] twice and got a representative. She said they do not have anything recent on the patient at this time. She advised me to have patient fill out application, provide income info, have provider fill out application portion and updated prescription.  I called patient. Ironically, he had spoke with [facility] and they have two more 90-day prescriptions for the patient. He said he is good for 6 more months. I suggested I would go ahead and mail him the [place] patient assistance application so he will have it for next time. He was appreciative.

## 2018-08-11 NOTE — Telephone Encounter (Signed)
[employer] administrator and mailed to patient so he can have when it is time to fill it out later this year or next.

## 2018-11-02 ENCOUNTER — Other Ambulatory Visit: Payer: Self-pay | Admitting: Cardiovascular Disease

## 2019-01-12 ENCOUNTER — Encounter: Payer: Self-pay | Admitting: Cardiovascular Disease

## 2019-01-12 ENCOUNTER — Other Ambulatory Visit: Payer: Self-pay

## 2019-01-12 ENCOUNTER — Ambulatory Visit (INDEPENDENT_AMBULATORY_CARE_PROVIDER_SITE_OTHER): Payer: PRIVATE HEALTH INSURANCE | Admitting: Cardiovascular Disease

## 2019-01-12 VITALS — BP 162/90 | HR 65 | Ht 72.0 in | Wt 276.0 lb

## 2019-01-12 DIAGNOSIS — R55 Syncope and collapse: Secondary | ICD-10-CM

## 2019-01-12 DIAGNOSIS — E782 Mixed hyperlipidemia: Secondary | ICD-10-CM

## 2019-01-12 DIAGNOSIS — I10 Essential (primary) hypertension: Secondary | ICD-10-CM

## 2019-01-12 DIAGNOSIS — R002 Palpitations: Secondary | ICD-10-CM

## 2019-01-12 MED ORDER — DOXAZOSIN MESYLATE 2 MG PO TABS: 2.0000 mg | ORAL_TABLET | Freq: Every evening | ORAL | 2 refills | Status: DC

## 2019-01-12 MED ORDER — LOSARTAN POTASSIUM 100 MG PO TABS: 100.0000 mg | ORAL_TABLET | Freq: Every day | ORAL | 2 refills | Status: DC

## 2019-01-12 MED ORDER — DILTIAZEM HCL ER BEADS 360 MG PO CP24: 360.0000 mg | ORAL_CAPSULE | Freq: Every day | ORAL | 2 refills | Status: DC

## 2019-01-12 MED ORDER — METOPROLOL SUCCINATE ER 25 MG PO TB24: 25.0000 mg | ORAL_TABLET | Freq: Every evening | ORAL | 2 refills | Status: DC

## 2019-01-12 NOTE — Progress Notes (Signed)
Cardiology Office Note  Date:  01/12/2019   ID:  Trevor Flores, [name] [birth year], MRN [id number]  PCP:  Trevor Flores,   Chief Complaint  Patient presents with  . Other    12 month follow up. Patient c/o elevated BP. Meds reviewed vebrally with patient.   HPI:  [name] is a very pleasant 58 year old gentleman with a history of hypertension,  lightheadedness,  obesity,  near syncope Tremor He presents for follow-up of his hypertension, near syncope  Son presents with him on today's visit In follow-up today reports that blood pressure continues to run high Pressure 140 up to 160 on a regular basis Reports compliance with his medication Denies having shortness of breath or leg edema  Has rare very brief chest pain lasting for seconds, seems to present once every other week, presented at rest  History of arthritis Vertigo seems to be well controlled  total chol 184, LDL 119, HBA1C 5.4 Nonsmoker, no diabetes  Prior monitor reviewed with him on today's visit Showing no significant arrhythmia Patient had a min HR of 37 bpm, max HR of 132 bpm, and avg HR of 55 bpm. Predominant underlying rhythm was Sinus Rhythm. First Degree AV Block was present. Isolated SVEs were rare (<1.0%), SVE Couplets were rare (<1.0%), and no SVE Triplets were present. Isolated VEs were rare (<1.0%), and no VE Couplets or VE Triplets were present.   EKG personally reviewed by myself on todays visit Shows normal sinus rhythm rate 65 bpm no significant ST-T wave changes, artifact from tremor.  No significant change  Other past medical history reviewed October 2013, he had positional vertigo described as a spinning when he sat up, better when he laid down. He went to the emergency room, had a CT scan and was told that he may have had an old stroke. Carotid ultrasound per his report showed no significant stenoses. He was seen by ear nose throat for his vertigo.  Echocardiogram October 2013 documented  ejection fraction 60%, normal wall motion. No mention of any valvular disease  Previous symptoms on simvastatin  Treadmill nuclear stress test every 2006. He was told that there is no significant blockage. Exercised well on the treadmill   PMH:   has a past medical history of Heart murmur, History of stress test, Hyperlipidemia, Hypertension, Palpitations, Possible Lacunar infarction (HCC), Pre-syncope, Resting tremor, and Vertigo.  PSH:    Past Surgical History:  Procedure Laterality Date  . APPENDECTOMY    . HEMORRHOID BANDING  ?   Dr Trevor Flores    Current Outpatient Medications  Medication Sig Dispense Refill  . allopurinol (ZYLOPRIM) 100 MG tablet Take 100 mg by mouth daily.    Marland Kitchen aspirin EC 81 MG tablet Take 1 tablet (81 mg total) by mouth daily. 90 tablet 3  . diltiazem (TIAZAC) 360 MG 24 hr capsule Take 360 mg by mouth daily.    Marland Kitchen EDARBI 80 MG TABS TAKE ONE TABLET BY MOUTH DAILY  90 tablet 1  . Linaclotide (LINZESS PO) Take by mouth as needed.    Marland Kitchen losartan (COZAAR) 100 MG tablet Take 100 mg by mouth daily.    . meclizine (ANTIVERT) 25 MG tablet Take 1 tablet (25 mg total) by mouth 3 (three) times daily as needed for dizziness. 30 tablet 1  . meloxicam (MOBIC) 15 MG tablet TK ONE T PO QD as needed  3  . metoprolol succinate (TOPROL-XL) 50 MG 24 hr tablet Take 1 tablet (50 mg total) by mouth daily.  90 tablet 3  . omeprazole (PRILOSEC) 40 MG capsule Take by mouth.    . rosuvastatin (CRESTOR) 5 MG tablet Take by mouth.     No current facility-administered medications for this visit.      Allergies:   Other and Hydralazine   Social History:  The patient  reports that he has never smoked. His smokeless tobacco use includes chew. He reports that he does not drink alcohol or use drugs.   Family History:   family history is not on file.    Review of Systems: Review of Systems  Constitutional: Negative.   HENT: Negative.   Respiratory: Negative.   Cardiovascular: Negative.    Gastrointestinal: Negative.   Musculoskeletal: Positive for joint pain.  Neurological: Positive for tremors.  Psychiatric/Behavioral: Negative.   All other systems reviewed and are negative.    PHYSICAL EXAM: VS:  BP (!) 162/90 (BP Location: Left Arm, Patient Position: Sitting, Cuff Size: Normal)   Pulse 65   Ht 6' (1.829 m)   Wt 276 lb (125.2 kg)   BMI 37.43 kg/m  , BMI Body mass index is 37.43 kg/m. Constitutional:  oriented to person, place, and time. No distress.  HENT:  Head: Grossly normal Eyes:  no discharge. No scleral icterus.  Neck: No JVD, no carotid bruits  Cardiovascular: Regular rate and rhythm, no murmurs appreciated Pulmonary/Chest: Clear to auscultation bilaterally, no wheezes or rails Abdominal: Soft.  no distension.  no tenderness.  Musculoskeletal: Normal range of motion Neurological:  normal muscle tone. Coordination normal. No atrophy Skin: Skin warm and dry Psychiatric: normal affect, pleasant  Recent Labs: No results found for requested labs within last 8760 hours.    Lipid Panel No results found for: CHOL, HDL, LDLCALC, TRIG   I pain Wt Readings from Last 3 Encounters:  01/12/19 276 lb (125.2 kg)  01/02/18 (!) 303 lb 8 oz (137.7 kg)  12/02/17 (!) 302 lb 8 oz (137.2 kg)     ASSESSMENT AND PLAN:  Essential hypertension Long discussion concerning his blood pressure He would like more aggressive blood pressure control After discussion of various medications, risk and benefit side effects We will start Cardura 1 mg daily for the first week up to 2 mg in the evening,  He will monitor blood pressure and call us in the next 2 weeks with numbers  Morbid obesity ([place]) He continues to follow very strict diet with weight loss  Palpitations, ectopy APCs PVCs on monitor   Bradycardia Bothered by low numbers he is getting at home sometimes in the 40s Feels the metoprolol dose is too high Recommend he decrease metoprolol succinate down to 25  daily take this in the evening  Near syncope Denies any further episodes , no further workup at this time Stable    Total encounter time more than 25 minutes  Greater than 50% was spent in counseling and coordination of care with the patient   Disposition:   F/U  12 months     Signed, Esmond Plants, M.D., Ph.D. 01/12/2019  [place], [place]

## 2019-01-12 NOTE — Patient Instructions (Addendum)
Medication Instructions:   Please decrease metoprolol to (25 mg)  in the evening with food Start cardura 2 mg  in the PM (ok to start a 1/2 pill for the first week, monitor blood pressure, then increase up to a full pill)  Please take losartan and diltiazem in the AM  If blood pressure >160, take an extra 1/2 cardura  If you need a refill on your cardiac medications before your next appointment, please call your pharmacy.    Lab work: No new labs needed   If you have labs (blood work) drawn today and your tests are completely normal, you will receive your results only by: Marland Kitchen MyChart Message (if you have MyChart) OR . A paper copy in the mail If you have any lab test that is abnormal or we need to change your treatment, we will call you to review the results.   Testing/Procedures: No new testing needed   Follow-Up: At [facility], you and your health needs are our priority.  As part of our continuing mission to provide you with exceptional heart care, we have created designated Provider Care Teams.  These Care Teams include your primary Cardiologist (physician) and Advanced Practice Providers (APPs -  Physician Assistants and Nurse Practitioners) who all work together to provide you with the care you need, when you need it.  . You will need a follow up appointment in 6 months .   Please call our office 2 months in advance to schedule this appointment.    . Providers on your designated Care Team:   . Murray Hodgkins, NP . Christell Faith, PA-C . Marrianne Mood, PA-C  Any Other Special Instructions Will Be Listed Below (If Applicable).  For educational health videos Log in to : [email or web address] Or : [email or web address], password : [email or web address]

## 2019-01-16 ENCOUNTER — Telehealth: Payer: Self-pay | Admitting: Physician Assistant

## 2019-01-16 NOTE — Telephone Encounter (Signed)
Trevor Flores is a 58 y.o. male with a history of hypertension.  He called answering service due to low blood pressure last night.  He saw Dr. Rockey Situ last week.  His blood pressure was uncontrolled.  He is currently on Edarbi 80 mg.  Due to cost, this is being changed to losartan 100 mg.  The patient was recently placed on doxazosin by Dr. Rockey Situ.  The plan was to take 1 mg nightly for several days and then increase to 2 mg nightly.  The patient had taken a couple of doses of doxazosin.  However, last night prior to taking his dose, his pressure was 90/70.  He did not have any lightheadedness or dizziness with this.  However, he skipped the doxazosin.  His blood pressure this morning was 170/100.  He took a dose of metoprolol 50 mg.  When I was on the phone with him, a repeat blood pressure was 150/88.  He has not had blurry vision, headaches or chest pain. PLAN:  1.  I advised him to take the doxazosin 1 mg tonight 2.  I advised him to stop Edarbi and start on losartan 100 mg tomorrow morning 3.  If his blood pressure continues to drop low with this new combination, we may need to either decrease his losartan to 50 mg or discontinue doxazosin altogether. 4.  He knows to call back if his blood pressure continues to fluctuate. Richardson Dopp, PA-C    01/16/2019 11:29 AM

## 2019-01-18 ENCOUNTER — Telehealth: Payer: Self-pay | Admitting: Cardiovascular Disease

## 2019-01-18 DIAGNOSIS — I10 Essential (primary) hypertension: Secondary | ICD-10-CM

## 2019-01-18 NOTE — Telephone Encounter (Signed)
Call to patient.  He reports He has backed off taking cardura that was prescribed by Dr. Rockey Situ last Thursday.   1st dose was on Friday night. He took 0.5 tablet as instructed by Dr. Rockey Situ (take 0.5 tablet daily for 1 week, then increase to whole tablet once daily).   Sat am BP 149/88, Sat evening around 9 pm, began feeling dizzy, BP 90/70 11 pm BP 130/85, 11 PM took 0.5 tab  Sun AM 149/88 Sun evening took 0.25 tablet   This morning 143/92  Pt also wanted to report that medication was giving him an erection 30 min following med admin lasting 15-20 min. Denies pain or prolonged erection.   Spoke to patient after verbal advice received from Dr. Rockey Situ. He reported that erection was not a common s/e of cardura.   He feels that low BP reading may be erroneous. Suggest to continue 0.5 tablet once daily close to bedtime. He may need to increase to 0.5 tablet in am, 0.5 in pm.   Pt to call back on Thursday with further readings.   I asked patient if he took any medications for ED as none are listed. He said in the past he took viagra but hasn't in over a month. He still has them at home.  I told him not to take them as they could lead to a unsafe drop in blood pressure. Pt denied taking any this weekend.   Pt agreeable to POC and will call later this week for update.

## 2019-01-18 NOTE — Telephone Encounter (Signed)
Pt c/o medication issue:  1. Name of Medication: doxazosin   2. How are you currently taking this medication (dosage and times per day)? 2 MG 1 tablet every evening   3. Are you having a reaction (difficulty breathing--STAT)? Low BP  4. What is your medication issue? Patient was prescribed new medication recently.  Patient's BP has been too low (90/70) and patient is afraid to take more of the medication.  Please call to discuss.

## 2019-01-19 NOTE — Telephone Encounter (Signed)
Can we call to follow up on BP?

## 2019-01-20 NOTE — Telephone Encounter (Signed)
Call to pt per Dr.Gollan request. Pt denies any low BP readings.   He has been taking 1/2 tablet of cardura at bedtime.  BP this am 150/100. No headache, cxp or changes in vision.   Pt reports that erections have continued w/in 30 min after taking med. They last ~ 20 min. He is not comfortable with this side effect and would like to see if he can change medicine. He says there was some pain associated last night with erection but did not persist.   Routing to Dr. Rockey Situ to make him aware.

## 2019-01-21 MED ORDER — ISOSORBIDE MONONITRATE ER 30 MG PO TB24: 30.0000 mg | ORAL_TABLET | Freq: Every day | ORAL | 3 refills | Status: DC

## 2019-01-21 NOTE — Addendum Note (Signed)
Addended by: Valora Corporal on: 01/21/2019 05:29 PM   Modules accepted: Orders

## 2019-01-21 NOTE — Telephone Encounter (Signed)
Spoke with patient and he reports that medication has continued to cause erection with some discomfort that lasts for up to 10 minutes. He states pharmacy pamphlet does list that he should go to ED if erection lasts for 4 hours or more but he states that he doesn't have time for that and it is uncomfortable. He would really like to try a different medication for his blood pressures to keep from having any further discomfort. Advised that I would send to provider for review and further recommendations.

## 2019-01-21 NOTE — Telephone Encounter (Signed)
He can stop the Cardura Try Imdur 30 mg daily

## 2019-01-21 NOTE — Telephone Encounter (Signed)
Patient returning call from nurse.

## 2019-01-21 NOTE — Telephone Encounter (Signed)
Spoke with patient and advised that provider would like him to start isosorbide mononitrate 30 mg once daily and discontinue the cardura. Instructed him to continue monitoring his blood pressures and call us if they remain elevated. I also mentioned that he should not take any viagra products while on this medication. He verbalized understanding and had no further questions at this time.

## 2019-01-22 NOTE — Telephone Encounter (Signed)
Patient called back in stating that his wife does not want him to take that medication because he was advised by pharmacy that he should not take it with his "Superman pill" (Viagra). Requested that he please stay on medication and avoid Viagra until I hear back from you on recommendations for other medication.

## 2019-01-26 NOTE — Telephone Encounter (Signed)
Spoke with patient and made him aware that provider would have to review his chart before making further recommendations. His wife doesn't want medication to interact with the viagra that he takes. Let him know that I would call him once I hear back from provider. He verbalized understanding with no further questions at this time.

## 2019-01-26 NOTE — Telephone Encounter (Signed)
Patient calling to check status of medication advise

## 2019-01-26 NOTE — Telephone Encounter (Signed)
Hold imdur Start chlorthalidone 12.5 mg daily with potassium 10 meq daily Check BMP in 3 weeks

## 2019-01-27 MED ORDER — POTASSIUM CHLORIDE ER 10 MEQ PO TBCR: 10.0000 meq | EXTENDED_RELEASE_TABLET | Freq: Every day | ORAL | 3 refills | Status: DC

## 2019-01-27 MED ORDER — CHLORTHALIDONE 25 MG PO TABS: 12.5000 mg | ORAL_TABLET | Freq: Every day | ORAL | 3 refills | Status: DC

## 2019-01-27 NOTE — Telephone Encounter (Signed)
Spoke with patient and reviewed provider recommendations. Instructed him to discontinue the isosorbide mononitrate and reviewed new medications that I sent in. Advised that I would send in prescriptions and that we would need him to go for labs around December 17th at the [facility] entrance of the hospital. Let him know that I would mail him lab slip as reminder. He verbalized understanding of all instructions with no further questions at this time.

## 2019-02-22 ENCOUNTER — Emergency Department
Admission: EM | Admit: 2019-02-22 | Discharge: 2019-02-22 | Disposition: A | Discharge status: Home / Self Care | Payer: PRIVATE HEALTH INSURANCE | Attending: Emergency Medicine | Admitting: Emergency Medicine

## 2019-02-22 ENCOUNTER — Other Ambulatory Visit: Payer: Self-pay

## 2019-02-22 DIAGNOSIS — T887XXA Unspecified adverse effect of drug or medicament, initial encounter: Secondary | ICD-10-CM

## 2019-02-22 DIAGNOSIS — Z7982 Long term (current) use of aspirin: Secondary | ICD-10-CM | POA: Diagnosis not present

## 2019-02-22 DIAGNOSIS — I10 Essential (primary) hypertension: Secondary | ICD-10-CM | POA: Diagnosis not present

## 2019-02-22 DIAGNOSIS — Z79899 Other long term (current) drug therapy: Secondary | ICD-10-CM | POA: Diagnosis not present

## 2019-02-22 DIAGNOSIS — K0889 Other specified disorders of teeth and supporting structures: Secondary | ICD-10-CM | POA: Insufficient documentation

## 2019-02-22 DIAGNOSIS — R200 Anesthesia of skin: Secondary | ICD-10-CM | POA: Diagnosis present

## 2019-02-22 DIAGNOSIS — T413X5A Adverse effect of local anesthetics, initial encounter: Secondary | ICD-10-CM | POA: Insufficient documentation

## 2019-02-22 DIAGNOSIS — F17220 Nicotine dependence, chewing tobacco, uncomplicated: Secondary | ICD-10-CM | POA: Diagnosis not present

## 2019-02-22 LAB — COMPREHENSIVE METABOLIC PANEL
ALT: 17 U/L (ref 0–44)
AST: 18 U/L (ref 15–41)
Albumin: 4.5 g/dL (ref 3.5–5.0)
Alkaline Phosphatase: 84 U/L (ref 38–126)
Anion gap: 12 (ref 5–15)
BUN: 11 mg/dL (ref 6–20)
CO2: 25 mmol/L (ref 22–32)
Calcium: 9.3 mg/dL (ref 8.9–10.3)
Chloride: 102 mmol/L (ref 98–111)
Creatinine, Ser: 1.22 mg/dL (ref 0.61–1.24)
GFR calc Af Amer: >60 mL/min (ref >60)
GFR calc non Af Amer: >60 mL/min (ref >60)
Glucose, Bld: 141 mg/dL — ABNORMAL HIGH (ref 70–99)
Potassium: 3.4 mmol/L — ABNORMAL LOW (ref 3.5–5.1)
Sodium: 139 mmol/L (ref 135–145)
Total Bilirubin: 0.8 mg/dL (ref 0.3–1.2)
Total Protein: 8.7 g/dL — ABNORMAL HIGH (ref 6.5–8.1)

## 2019-02-22 LAB — CBC WITH DIFFERENTIAL/PLATELET
Abs Immature Granulocytes: 0.02 10*3/uL (ref 0.00–0.07)
Basophils Absolute: 0 10*3/uL (ref 0.0–0.1)
Basophils Relative: 0 %
Eosinophils Absolute: 0.1 10*3/uL (ref 0.0–0.5)
Eosinophils Relative: 1 %
HCT: 42.5 % (ref 39.0–52.0)
Hemoglobin: 13.8 g/dL (ref 13.0–17.0)
Immature Granulocytes: 0 %
Lymphocytes Relative: 32 %
Lymphs Abs: 2.5 10*3/uL (ref 0.7–4.0)
MCH: 28.2 pg (ref 26.0–34.0)
MCHC: 32.5 g/dL (ref 30.0–36.0)
MCV: 86.7 fL (ref 80.0–100.0)
Monocytes Absolute: 0.6 10*3/uL (ref 0.1–1.0)
Monocytes Relative: 8 %
Neutro Abs: 4.6 10*3/uL (ref 1.7–7.7)
Neutrophils Relative %: 59 %
Platelets: 242 10*3/uL (ref 150–400)
RBC: 4.9 MIL/uL (ref 4.22–5.81)
RDW: 13.4 % (ref 11.5–15.5)
WBC: 7.8 10*3/uL (ref 4.0–10.5)
nRBC: 0 % (ref 0.0–0.2)

## 2019-02-22 LAB — URINALYSIS, ROUTINE W REFLEX MICROSCOPIC
Bacteria, UA: NONE SEEN
Bilirubin Urine: NEGATIVE
Glucose, UA: NEGATIVE mg/dL
Ketones, ur: NEGATIVE mg/dL
Leukocytes,Ua: NEGATIVE
Nitrite: NEGATIVE
Protein, ur: 30 mg/dL — AB
Specific Gravity, Urine: 1.012 (ref 1.005–1.030)
pH: 5 (ref 5.0–8.0)

## 2019-02-22 NOTE — ED Triage Notes (Signed)
Pt states has been having right lower dental pain for two weeks and using oragel. Pt states this am around midnight he began to experience right sided lower jaw numbness. Pt states son checked his pulse this am and it was in 120s.

## 2019-02-22 NOTE — ED Notes (Signed)
Spoke with dr. Charna Archer regarding pt's previous tia, facial assymetry and new presenting symptoms. No head ct ordered, lab work and ekg orders received.

## 2019-02-22 NOTE — ED Notes (Signed)
Pt denies pain or complaints at this time.

## 2019-02-22 NOTE — ED Notes (Signed)
Pt appears anxious on arrival, "I don't want to see my blood pressure and I don't want to know what it is. I know worrying about it will make it higher." Pt reports history of tremors. Pt reports new onset nocturia starting last night "I was up every hour peeing."

## 2019-02-22 NOTE — ED Notes (Signed)
This RN at bedside to review d/c instructions with pt, spoke with pts son who had questions about pts frequent urination.  Dr Corky Downs back in at bedside to address questions and concerns from son.

## 2019-02-22 NOTE — ED Provider Notes (Signed)
[facility] Emergency Department Provider Note   ____________________________________________    I have reviewed the triage vital signs and the nursing notes.   HISTORY  Chief Complaint Numbness    HPI Trevor Flores is a 58 y.o. male with history as noted below who presents with complaints of numbness to the right lower anterior gums.  Patient notes that he used a copious amount of Orajel last night because he has a painful tooth in his right upper jaw for which he has a dentist appointment tomorrow.  This was used prior to bed.  Area of numbness is only anterior gum just below the area where he applied Orajel.  No other facial numbness, no neuro deficits.  No headache.  When he felt the numbness he became somewhat alarmed and checked his pulse which was 100 which alarmed him further.  Heart rate has now normalized.  Overall he feels quite well  Past Medical History:  Diagnosis Date  . Heart murmur    a. 12/2011 Echo: EF 60%, no rwma.  . History of stress test    a. 04/2004 Ex MV: Ex time 10:30. Hypertensive response. No ischemia/infarct.  . Hyperlipidemia   . Hypertension   . Palpitations   . Possible Lacunar infarction ([place])    a. 12/2011 Head CT: CSF density lateral to R cerebral peduncle - ? prior lacunar infarct.  . Pre-syncope    a. 06/2014 Monitor: Sinus rhythm. No significant arrhythmia.  Marland Kitchen Resting tremor   . Vertigo     Patient Active Problem List   Diagnosis Date Noted  . Encounter for screening colonoscopy 10/22/2016  . Chest pain 01/22/2016  . Near syncope 05/25/2014  . Dizziness 08/19/2012  . Vertigo 08/19/2012  . Essential hypertension 08/19/2012  . Morbid obesity ([place]) 08/19/2012    Past Surgical History:  Procedure Laterality Date  . APPENDECTOMY    . HEMORRHOID BANDING  ?   Dr Nicolasa Ducking    Prior to Admission medications   Medication Sig Start Date End Date Taking? Authorizing Provider  allopurinol (ZYLOPRIM) 100 MG  tablet Take 100 mg by mouth daily. 12/28/18   [provider]  aspirin EC 81 MG tablet Take 1 tablet (81 mg total) by mouth daily. 06/13/14   [provider]  chlorthalidone (HYGROTON) 25 MG tablet Take 0.5 tablets (12.5 mg total) by mouth daily. 01/27/19   Minna Merritts, MD  diltiazem (TIAZAC) 360 MG 24 hr capsule Take 1 capsule (360 mg total) by mouth daily. In the morning. 01/12/19   Minna Merritts, MD  EDARBI 80 MG TABS TAKE ONE TABLET BY MOUTH DAILY  11/03/18   Minna Merritts, MD  Linaclotide (LINZESS PO) Take by mouth as needed.    [provider]  losartan (COZAAR) 100 MG tablet Take 1 tablet (100 mg total) by mouth daily. In the morning. 01/12/19   Minna Merritts, MD  meclizine (ANTIVERT) 25 MG tablet Take 1 tablet (25 mg total) by mouth 3 (three) times daily as needed for dizziness. 08/11/17   Minna Merritts, MD  meloxicam (MOBIC) 15 MG tablet TK ONE T PO QD as needed 08/13/15   [provider]  metoprolol succinate (TOPROL-XL) 25 MG 24 hr tablet Take 1 tablet (25 mg total) by mouth every evening. With food. 01/12/19   Minna Merritts, MD  omeprazole (PRILOSEC) 40 MG capsule Take by mouth. 06/11/18   [provider]  potassium chloride (KLOR-CON) 10 MEQ tablet  Take 1 tablet (10 mEq total) by mouth daily. 01/27/19 04/27/19  Antonieta Iba, MD  rosuvastatin (CRESTOR) 5 MG tablet Take by mouth. 01/07/19 01/07/20  [provider]     Allergies Other and Hydralazine  Family History  Problem Relation Age of Onset  . Colon cancer Neg Hx     Social History Social History   Tobacco Use  . Smoking status: Never Smoker  . Smokeless tobacco: Current User    Types: Chew  Substance Use Topics  . Alcohol use: No  . Drug use: No    Review of Systems  Constitutional: No fever/chills Eyes: No visual changes.  ENT: As above Cardiovascular: Denies chest pain.  As above Respiratory: Denies shortness of  breath. Gastrointestinal: No abdominal pain.  No nausea, no vomiting.   Genitourinary: Frequent urination Musculoskeletal: Negative for back pain. Skin: Negative for rash. Neurological: As above   ____________________________________________   PHYSICAL EXAM:  VITAL SIGNS: ED Triage Vitals  Enc Vitals Group     BP 02/22/19 0629 (!) 173/103     Pulse Rate 02/22/19 0627 (!) 103     Resp 02/22/19 0627 16     Temp 02/22/19 0635 98.1 F (36.7 C)     Temp Source 02/22/19 0627 Oral     SpO2 02/22/19 0627 97 %     Weight 02/22/19 0627 129.3 kg (285 lb)     Height 02/22/19 0627 1.829 m (6')     Head Circumference --      Peak Flow --      Pain Score 02/22/19 0627 0     Pain Loc --      Pain Edu? --      Excl. in GC? --     Constitutional: Alert and oriented.   Nose: No congestion/rhinnorhea. Mouth/Throat: Mucous membranes are moist.  Normal intraoral exam, somewhat poor dentition Neck:  Painless ROM Cardiovascular: Normal rate, regular rhythm. Grossly normal heart sounds.  Good peripheral circulation. Respiratory: Normal respiratory effort.  No retractions. Gastrointestinal: Soft and nontender. No distention.   Musculoskeletal:   Warm and well perfused Neurologic:  Normal speech and language. No gross focal neurologic deficits are appreciated.  Cranial nerves II through XII are normal Skin:  Skin is warm, dry and intact. No rash noted. Psychiatric: Mood and affect are normal. Speech and behavior are normal.  ____________________________________________   LABS (all labs ordered are listed, but only abnormal results are displayed)  Labs Reviewed  COMPREHENSIVE METABOLIC PANEL - Abnormal; Notable for the following components:      Result Value   Potassium 3.4 (*)    Glucose, Bld 141 (*)    Total Protein 8.7 (*)    All other components within normal limits  CBC WITH DIFFERENTIAL/PLATELET   ____________________________________________  EKG  ED ECG REPORT I, Jene Every, the attending physician, personally viewed and interpreted this ECG.  Date: 02/22/2019  Rhythm: normal sinus rhythm QRS Axis: normal Intervals: normal ST/T Wave abnormalities: Nonspecific changes Narrative Interpretation: no evidence of acute ischemia  ____________________________________________  RADIOLOGY  None ____________________________________________   PROCEDURES  Procedure(s) performed: No  Procedures   Critical Care performed: No ____________________________________________   INITIAL IMPRESSION / ASSESSMENT AND PLAN / ED COURSE  Pertinent labs & imaging results that were available during my care of the patient were reviewed by me and considered in my medical decision making (see chart for details).  Patient well-appearing and in no acute distress, small area of numbness only to the gum  just inferior to area where benzocaine applied, suspect that the medication for an area leading to the numbness.  No other numbness weakness or alarming symptoms.  Heart rate is normal here.  On my exam [id number].  Has dental appointment tomorrow, at this point most reasonable approach would be to give this further time to see if the benzocaine wears off.  Not consistent with CVA    ____________________________________________   FINAL CLINICAL IMPRESSION(S) / ED DIAGNOSES  Final diagnoses:  Numbness  Medication side effect        Note:  This document was prepared using Dragon voice recognition software and may include unintentional dictation errors.   Jene EveryKinner, Artemis Loyal, MD 02/22/19 [phone]

## 2019-02-22 NOTE — ED Notes (Signed)
Pt reports taking 7.5mg  oxycodone BID for tooth pain, last dose was approx 2300 last night

## 2019-02-22 NOTE — ED Notes (Signed)
Dr Kinner at bedside. 

## 2019-02-23 LAB — URINE CULTURE
Culture: NO GROWTH
Special Requests: NORMAL

## 2019-07-12 NOTE — Progress Notes (Signed)
Cardiology Office Note  Date:  07/13/2019   ID:  Trevor Flores, [name] 06-Aug-1960, MRN [id number]  PCP:  Trevor Flores,   Chief Complaint  Patient presents with  . OTHER    6 month f/u no complaints today. Meds reviewed verbally with pt.   HPI:  [name] is a very pleasant 59 year old gentleman with a history of hypertension,  lightheadedness,  obesity,  near syncope Tremor hyperlipidemia He presents for follow-up of his hypertension, near syncope  At home he reports having blood pressure around 125/80  Currently taking losartan 100, metoprolol succinate 25, diltiazem 360 Pulse 65 on average at home  Walks a lot,  1 mile Walk slowly Weight down over the past year, only eats twice a day  Reports having chronic knee pain, had cortisone, little better  Lab work reviewed with him Total chol 135, LDL 78  Denies any tachycardia concerning for arrhythmia No significant lightheadedness, orthostasis, near syncope  No significant lower extremity edema Chronic tremor  Continues to have rare fleeting chest pain at rest, feels it is more musculoskeletal  Non-smoker, no diabetes A1c 5.4  EKG personally reviewed by myself on todays visit Shows normal sinus rhythm rate 75 bpm no significant ST-T wave changes, artifact from tremor.  No significant change  Other past medical history reviewed October 2013, he had positional vertigo described as a spinning when he sat up, better when he laid down. He went to the emergency room, had a CT scan and was told that he may have had an old stroke. Carotid ultrasound per his report showed no significant stenoses. He was seen by ear nose throat for his vertigo.  Echocardiogram October 2013 documented ejection fraction 60%, normal wall motion. No mention of any valvular disease  Previous symptoms on simvastatin  Treadmill nuclear stress test every 2006. He was told that there is no significant blockage. Exercised well on the  treadmill   PMH:   has a past medical history of Heart murmur, History of stress test, Hyperlipidemia, Hypertension, Palpitations, Possible Lacunar infarction (HCC), Pre-syncope, Resting tremor, and Vertigo.  PSH:    Past Surgical History:  Procedure Laterality Date  . APPENDECTOMY    . HEMORRHOID BANDING  ?   Dr Trevor Flores    Current Outpatient Medications  Medication Sig Dispense Refill  . allopurinol (ZYLOPRIM) 100 MG tablet Take 100 mg by mouth daily.    Marland Kitchen aspirin EC 81 MG tablet Take 1 tablet (81 mg total) by mouth daily. 90 tablet 3  . diltiazem (TIAZAC) 360 MG 24 hr capsule Take 1 capsule (360 mg total) by mouth daily. In the morning. 90 capsule 2  . fluticasone (FLONASE) 50 MCG/ACT nasal spray Place into the nose daily.    Marland Kitchen loratadine (CLARITIN) 10 MG tablet Take by mouth daily.    Marland Kitchen losartan (COZAAR) 100 MG tablet Take 1 tablet (100 mg total) by mouth daily. In the morning. 90 tablet 2  . meclizine (ANTIVERT) 25 MG tablet Take 1 tablet (25 mg total) by mouth 3 (three) times daily as needed for dizziness. 30 tablet 1  . meloxicam (MOBIC) 15 MG tablet TK ONE T PO QD as needed  3  . metoprolol succinate (TOPROL-XL) 25 MG 24 hr tablet Take 1 tablet (25 mg total) by mouth every evening. With food. 90 tablet 2  . omeprazole (PRILOSEC) 40 MG capsule Take by mouth.    . rosuvastatin (CRESTOR) 5 MG tablet Take by mouth.    . doxazosin (CARDURA) 2  MG tablet Take by mouth daily as needed.     No current facility-administered medications for this visit.    Allergies:   Other and Hydralazine   Social History:  The patient  reports that he has never smoked. His smokeless tobacco use includes chew. He reports that he does not drink alcohol or use drugs.   Family History:   family history is not on file.   Review of Systems: Review of Systems  Constitutional: Negative.   HENT: Negative.   Respiratory: Negative.   Cardiovascular: Negative.   Gastrointestinal: Negative.    Musculoskeletal: Positive for joint pain.  Neurological: Positive for tremors.  Psychiatric/Behavioral: Negative.   All other systems reviewed and are negative.    PHYSICAL EXAM: VS:  BP (!) 160/94 (BP Location: Left Arm, Patient Position: Sitting, Cuff Size: Normal)   Pulse 75   Ht 6' (1.829 m)   Wt 274 lb 4 oz (124.4 kg)   SpO2 95%   BMI 37.19 kg/m  , BMI Body mass index is 37.19 kg/m. Constitutional:  oriented to person, place, and time. No distress.  HENT:  Head: Grossly normal Eyes:  no discharge. No scleral icterus.  Neck: No JVD, no carotid bruits  Cardiovascular: Regular rate and rhythm, no murmurs appreciated Pulmonary/Chest: Clear to auscultation bilaterally, no wheezes or rails Abdominal: Soft.  no distension.  no tenderness.  Musculoskeletal: Normal range of motion Neurological:  normal muscle tone. Coordination normal. No atrophy Skin: Skin warm and dry Psychiatric: normal affect, pleasant   Recent Labs: 02/22/2019: ALT 17; Flores 11; Creatinine, Ser 1.22; Hemoglobin 13.8; Platelets 242; Potassium 3.4; Sodium 139    Lipid Panel No results found for: CHOL, HDL, LDLCALC, TRIG   I pain Wt Readings from Last 3 Encounters:  07/13/19 274 lb 4 oz (124.4 kg)  02/22/19 285 lb (129.3 kg)  01/12/19 276 lb (125.2 kg)     ASSESSMENT AND PLAN:  Essential hypertension Blood pressure is well controlled on today's visit. No changes made to the medications. Not on cardura  Morbid obesity ([place]) He continues to follow very strict diet with weight loss  Palpitations, ectopy No sx, no change in meds  Bradycardia No sx, no further workup  Near syncope BP stable, no orthostasis sx    Total encounter time more than 25 minutes  Greater than 50% was spent in counseling and coordination of care with the patient   Disposition:   F/U  12 months     Signed, Esmond Plants, M.D., Ph.D. 07/13/2019  [place], [place]

## 2019-07-13 ENCOUNTER — Encounter: Payer: Self-pay | Admitting: Cardiovascular Disease

## 2019-07-13 ENCOUNTER — Ambulatory Visit (INDEPENDENT_AMBULATORY_CARE_PROVIDER_SITE_OTHER): Payer: Managed Care, Other (non HMO) | Admitting: Cardiovascular Disease

## 2019-07-13 ENCOUNTER — Other Ambulatory Visit: Payer: Self-pay

## 2019-07-13 VITALS — BP 142/85 | HR 75 | Ht 72.0 in | Wt 274.2 lb

## 2019-07-13 DIAGNOSIS — R002 Palpitations: Secondary | ICD-10-CM | POA: Diagnosis not present

## 2019-07-13 DIAGNOSIS — E782 Mixed hyperlipidemia: Secondary | ICD-10-CM

## 2019-07-13 DIAGNOSIS — R079 Chest pain, unspecified: Secondary | ICD-10-CM | POA: Diagnosis not present

## 2019-07-13 DIAGNOSIS — I10 Essential (primary) hypertension: Secondary | ICD-10-CM

## 2019-07-13 DIAGNOSIS — R55 Syncope and collapse: Secondary | ICD-10-CM

## 2019-07-13 NOTE — Patient Instructions (Signed)
Medication Instructions:  No changes  If you need a refill on your cardiac medications before your next appointment, please call your pharmacy.    Lab work: No new labs needed   If you have labs (blood work) drawn today and your tests are completely normal, you will receive your results only by: . MyChart Message (if you have MyChart) OR . A paper copy in the mail If you have any lab test that is abnormal or we need to change your treatment, we will call you to review the results.   Testing/Procedures: No new testing needed   Follow-Up: At [facility], you and your health needs are our priority.  As part of our continuing mission to provide you with exceptional heart care, we have created designated Provider Care Teams.  These Care Teams include your primary Cardiologist (physician) and Advanced Practice Providers (APPs -  Physician Assistants and Nurse Practitioners) who all work together to provide you with the care you need, when you need it.  . You will need a follow up appointment in 12 months  . Providers on your designated Care Team:   . Christopher Berge, NP . Ryan Dunn, PA-C . Jacquelyn Visser, PA-C  Any Other Special Instructions Will Be Listed Below (If Applicable).  COVID-19 Vaccine Information can be found at: [email or web address] For questions related to vaccine distribution or appointments, please email [email or web address] or call [phone].     

## 2019-10-01 ENCOUNTER — Other Ambulatory Visit: Payer: Self-pay | Admitting: Cardiovascular Disease

## 2019-10-01 NOTE — Telephone Encounter (Signed)
Please advise if ok to refill Doxazosin 2 mg tablet pt taking qd prn. Last filled by Historical provider.

## 2019-10-01 NOTE — Telephone Encounter (Signed)
°  07/13/19 office visit note from Dr Mariah Milling: Essential hypertension Blood pressure is well controlled on today's visit. No changes made to the medications. Not on cardura    Therefore, refill refused.

## 2020-06-07 ENCOUNTER — Other Ambulatory Visit: Payer: Self-pay | Admitting: Pediatrics

## 2020-06-07 DIAGNOSIS — M1990 Unspecified osteoarthritis, unspecified site: Secondary | ICD-10-CM

## 2020-06-27 ENCOUNTER — Other Ambulatory Visit: Payer: Self-pay | Admitting: Pediatrics

## 2020-06-27 ENCOUNTER — Ambulatory Visit
Admission: RE | Admit: 2020-06-27 | Discharge: 2020-06-27 | Disposition: A | Discharge status: Home / Self Care | Payer: Disability Insurance | Attending: Pediatrics | Admitting: Pediatrics

## 2020-06-27 ENCOUNTER — Ambulatory Visit
Admission: RE | Admit: 2020-06-27 | Discharge: 2020-06-27 | Disposition: A | Discharge status: Home / Self Care | Payer: Disability Insurance | Source: Ambulatory Visit | Attending: Pediatrics | Admitting: Pediatrics

## 2020-06-27 DIAGNOSIS — M1990 Unspecified osteoarthritis, unspecified site: Secondary | ICD-10-CM

## 2020-07-18 ENCOUNTER — Other Ambulatory Visit: Payer: Self-pay

## 2020-07-18 ENCOUNTER — Encounter: Payer: Self-pay | Admitting: Cardiovascular Disease

## 2020-07-18 ENCOUNTER — Ambulatory Visit (INDEPENDENT_AMBULATORY_CARE_PROVIDER_SITE_OTHER): Payer: 59 | Admitting: Cardiovascular Disease

## 2020-07-18 VITALS — BP 150/70 | HR 71 | Ht 72.0 in | Wt 274.4 lb

## 2020-07-18 DIAGNOSIS — E782 Mixed hyperlipidemia: Secondary | ICD-10-CM

## 2020-07-18 DIAGNOSIS — R002 Palpitations: Secondary | ICD-10-CM

## 2020-07-18 DIAGNOSIS — R079 Chest pain, unspecified: Secondary | ICD-10-CM | POA: Diagnosis not present

## 2020-07-18 DIAGNOSIS — R55 Syncope and collapse: Secondary | ICD-10-CM

## 2020-07-18 DIAGNOSIS — I10 Essential (primary) hypertension: Secondary | ICD-10-CM

## 2020-07-18 MED ORDER — DOXAZOSIN MESYLATE 1 MG PO TABS: 1.0000 mg | ORAL_TABLET | Freq: Every day | ORAL | 1 refills | Status: DC | PRN

## 2020-07-18 NOTE — Patient Instructions (Signed)
Medication Instructions:  No changes  If you need a refill on your cardiac medications before your next appointment, please call your pharmacy.    Lab work: No new labs needed   If you have labs (blood work) drawn today and your tests are completely normal, you will receive your results only by: . MyChart Message (if you have MyChart) OR . A paper copy in the mail If you have any lab test that is abnormal or we need to change your treatment, we will call you to review the results.   Testing/Procedures: No new testing needed   Follow-Up: At [facility], you and your health needs are our priority.  As part of our continuing mission to provide you with exceptional heart care, we have created designated Provider Care Teams.  These Care Teams include your primary Cardiologist (physician) and Advanced Practice Providers (APPs -  Physician Assistants and Nurse Practitioners) who all work together to provide you with the care you need, when you need it.  . You will need a follow up appointment in 12 months  . Providers on your designated Care Team:   . Christopher Berge, NP . Ryan Dunn, PA-C . Jacquelyn Visser, PA-C  Any Other Special Instructions Will Be Listed Below (If Applicable).  COVID-19 Vaccine Information can be found at: [email or web address] For questions related to vaccine distribution or appointments, please email [email or web address] or call [phone].     

## 2020-07-18 NOTE — Progress Notes (Signed)
Cardiology Office Note  Date:  07/18/2020   ID:  [name], [name] 16-Jul-1960, MRN [id number]  PCP:  Marcello Fennel,   Chief Complaint  Patient presents with  . 12 month follow up    Patient c/o fluttering in heart at times. Medications reviewed by the patient verbally.    HPI:  [name] is a very pleasant 60 year old gentleman with a history of hypertension,  lightheadedness,  obesity,  near syncope Tremor hyperlipidemia He presents for follow-up of his hypertension, near syncope  LOV 07/2019  BP at home 120/70 to 80 Takes cardura 1 mg as needed  Presents today with wife Bad tremor, getting worse over the past several years Muscle rigidity, difficulty transitioning from wheelchair to chair on todays visit Tries to walk daily, not able to walk very far  Denies any side effects from his medications Rare flutter chronic knee pain, legs weak Muscles around his knee is getting weaker No orthostasis, no near syncope or syncope  History of fleeting chest pain secondary to muscular ligamental etiology  Lab work reviewed with him Total chol 140, LDL 82  Non-smoker, no diabetes A1c 5.4  EKG personally reviewed by myself on todays visit Shows normal sinus rhythm rate 71 bpm no significant ST-T wave changes, artifact from tremor.  No significant change   PMH:   has a past medical history of Heart murmur, History of stress test, Hyperlipidemia, Hypertension, Palpitations, Possible Lacunar infarction (HCC), Pre-syncope, Resting tremor, and Vertigo.  PSH:    Past Surgical History:  Procedure Laterality Date  . APPENDECTOMY    . HEMORRHOID BANDING  ?   Dr Maryruth Bun    Current Outpatient Medications  Medication Sig Dispense Refill  . allopurinol (ZYLOPRIM) 100 MG tablet Take 100 mg by mouth daily.    Marland Kitchen aspirin EC 81 MG tablet Take 1 tablet (81 mg total) by mouth daily. 90 tablet 3  . diltiazem (TIAZAC) 360 MG 24 hr capsule TAKE 1 CAPSULE(360 MG) BY MOUTH DAILY IN THE  MORNING 90 capsule 3  . fluticasone (FLONASE) 50 MCG/ACT nasal spray Place into the nose daily.    Marland Kitchen LINZESS 72 MCG capsule Take 72 mcg by mouth every morning.    . loratadine (CLARITIN) 10 MG tablet Take by mouth daily.    Marland Kitchen losartan (COZAAR) 100 MG tablet Take 1 tablet (100 mg total) by mouth daily. In the morning. 90 tablet 2  . meclizine (ANTIVERT) 25 MG tablet Take 1 tablet (25 mg total) by mouth 3 (three) times daily as needed for dizziness. 30 tablet 1  . meloxicam (MOBIC) 15 MG tablet TK ONE T PO QD as needed  3  . metoprolol succinate (TOPROL-XL) 25 MG 24 hr tablet Take 1 tablet (25 mg total) by mouth every evening. With food. 90 tablet 2  . omeprazole (PRILOSEC) 40 MG capsule Take by mouth.    . oxyCODONE-acetaminophen (PERCOCET) 7.5-325 MG tablet Take 1 tablet by mouth every 6 (six) hours as needed.    Marland Kitchen Plecanatide (TRULANCE) 3 MG TABS Take 3 mg by mouth as needed.    . rosuvastatin (CRESTOR) 5 MG tablet Take 5 mg by mouth daily.     No current facility-administered medications for this visit.    Allergies:   Other and Hydralazine   Social History:  The patient  reports that he has never smoked. His smokeless tobacco use includes chew. He reports that he does not drink alcohol and does not use drugs.   Family History:  family history is not on file.   Review of Systems: Review of Systems  Constitutional: Negative.   HENT: Negative.   Respiratory: Negative.   Cardiovascular: Negative.   Gastrointestinal: Negative.   Musculoskeletal: Positive for joint pain.  Neurological: Positive for tremors.  Psychiatric/Behavioral: Negative.   All other systems reviewed and are negative.    PHYSICAL EXAM: VS:  BP (!) 150/70 (BP Location: Left Arm, Patient Position: Sitting, Cuff Size: Large)   Pulse 71   Ht 6' (1.829 m)   Wt 274 lb 6 oz (124.5 kg)   SpO2 98%   BMI 37.21 kg/m  , BMI Body mass index is 37.21 kg/m. Constitutional:  oriented to person, place, and time. No  distress.  Difficulty standing and transitioning from chair to wheelchair HENT:  Head: Grossly normal Eyes:  no discharge. No scleral icterus.  Neck: No JVD, no carotid bruits  Cardiovascular: Regular rate and rhythm, no murmurs appreciated Pulmonary/Chest: Clear to auscultation bilaterally, no wheezes or rails Abdominal: Soft.  no distension.  no tenderness.  Musculoskeletal: Normal range of motion Neurological:  normal muscle tone. Coordination normal. No atrophy Skin: Skin warm and dry Psychiatric: normal affect, pleasant   Recent Labs: No results found for requested labs within last 8760 hours.    Lipid Panel No results found for: CHOL, HDL, LDLCALC, TRIG   I pain Wt Readings from Last 3 Encounters:  07/18/20 274 lb 6 oz (124.5 kg)  07/13/19 274 lb 4 oz (124.4 kg)  02/22/19 285 lb (129.3 kg)     ASSESSMENT AND PLAN:  Essential hypertension Blood pressure stable, no changes made  on cardura PRN 1 mg pressure >150  Morbid obesity (HCC) Weight stable  Palpitations, ectopy Rare symptoms No further work-up, no changes to medications  Near syncope BP stable, no orthostasis sx  Tremor/Parkinson's Difficulty ambulating, mobility very stiff, significant tremor    Total encounter time more than 25 minutes  Greater than 50% was spent in counseling and coordination of care with the patient     Signed, Dossie Arbour, M.D., Ph.D. 07/18/2020  [facility] [place], [place] [phone]

## 2020-09-01 DEATH — deceased

## 2021-01-12 ENCOUNTER — Other Ambulatory Visit: Payer: Self-pay | Admitting: Cardiovascular Disease

## 2021-04-12 ENCOUNTER — Other Ambulatory Visit: Payer: Self-pay | Admitting: Cardiovascular Disease

## 2021-04-29 ENCOUNTER — Emergency Department
Admission: EM | Admit: 2021-04-29 | Discharge: 2021-04-29 | Disposition: A | Discharge status: Home / Self Care | Payer: Medicare Other | Attending: Emergency Medicine | Admitting: Emergency Medicine

## 2021-04-29 ENCOUNTER — Emergency Department: Payer: Medicare Other

## 2021-04-29 ENCOUNTER — Other Ambulatory Visit: Payer: Self-pay

## 2021-04-29 DIAGNOSIS — M542 Cervicalgia: Secondary | ICD-10-CM | POA: Diagnosis present

## 2021-04-29 DIAGNOSIS — G8929 Other chronic pain: Secondary | ICD-10-CM | POA: Insufficient documentation

## 2021-04-29 MED ORDER — ORPHENADRINE CITRATE ER 100 MG PO TB12: 100.0000 mg | ORAL_TABLET | Freq: Once | ORAL | Status: DC

## 2021-04-29 MED ORDER — MELOXICAM 15 MG PO TABS: ORAL_TABLET | ORAL | 0 refills | Status: DC

## 2021-04-29 MED ORDER — METHOCARBAMOL 500 MG PO TABS: 500.0000 mg | ORAL_TABLET | Freq: Every evening | ORAL | 1 refills | Status: DC | PRN

## 2021-04-29 MED ORDER — IBUPROFEN 800 MG PO TABS
800.0000 mg | ORAL_TABLET | Freq: Once | ORAL | Status: DC
  Filled 2021-04-29: qty 1

## 2021-04-29 MED ORDER — METHOCARBAMOL 500 MG PO TABS
500.0000 mg | ORAL_TABLET | Freq: Once | ORAL | Status: AC
  Administered 2021-04-29: 500 mg via ORAL
  Filled 2021-04-29: qty 1

## 2021-04-29 NOTE — Discharge Instructions (Addendum)
You were seen today for chronic neck pain.  Your x-ray did not show any acute findings.  I refilled your meloxicam, take this daily especially if it helps your neck pain.  Have also prescribed a muscle relaxer for bedtime to help relax her muscles.  Please be aware that this may cause sedation.  I have given you some neck exercises to try at home.  Heat and massage may also be helpful.  Please follow-up with your PCP/orthopedist for further evaluation of symptoms

## 2021-04-29 NOTE — ED Triage Notes (Signed)
Pt states he has been having neck pain off and on for 8-9 months- pt states that he has not had any injuries to the area- states he feels like he has a crick in his neck

## 2021-04-29 NOTE — ED Provider Notes (Signed)
[facility] Provider Note    Event Date/Time   First MD Initiated Contact with Patient 04/29/21 1330     (approximate)   History   Neck Pain   HPI  JARELLE ATES is a 61 y.o. male with presumed parkinsonism, presents to the ER today with complaint of neck pain.  He reports this started about 1 year ago.  He describes the pain as achy.  The pain does not radiate.  He denies associated headache, dizziness or vision changes.  He denies numbness, tingling or weakness of his upper extremities.  He denies any specific injury to the area or prior surgeries.  He has taken Tylenol OTC with minimal relief of symptoms.   Physical Exam   Triage Vital Signs: ED Triage Vitals  Enc Vitals Group     BP 04/29/21 1235 (!) 163/97     Pulse Rate 04/29/21 1235 81     Resp 04/29/21 1235 18     Temp 04/29/21 1235 98 F (36.7 C)     Temp Source 04/29/21 1235 Oral     SpO2 04/29/21 1235 97 %     Weight 04/29/21 1236 270 lb (122.5 kg)     Height 04/29/21 1236 6' (1.829 m)     Head Circumference --      Peak Flow --      Pain Score 04/29/21 1235 6     Pain Loc --      Pain Edu? --      Excl. in GC? --     Most recent vital signs: Vitals:   04/29/21 1235  BP: (!) 163/97  Pulse: 81  Resp: 18  Temp: 98 F (36.7 C)  SpO2: 97%     General: Awake, no distress.  CV:  RRR, no murmur noted. Resp:  Normal effort.  CTA bilaterally. MSK:  Normal flexion of the cervical spine.  Decreased extension, rotation and lateral bending.  No bony tenderness noted over the cervical spine.  Pain with palpation of bilateral cervical paraspinal muscles.  Shoulder strength equal.  Strength 5/5 BUE.  Handgrips equal Neuro:  Resting tremor noted of bilateral hands.   ED Results / Procedures / Treatments     RADIOLOGY  Imaging Orders         DG Cervical Spine 2-3 Views     IMPRESSION: Negative cervical spine radiographs.   MEDICATIONS ORDERED IN ED: Medications  ibuprofen  (ADVIL) tablet 800 mg (800 mg Oral Not Given 04/29/21 1406)  methocarbamol (ROBAXIN) tablet 500 mg (500 mg Oral Given 04/29/21 1406)     IMPRESSION / MDM / ASSESSMENT AND PLAN / ED COURSE  I reviewed the triage vital signs and the nursing notes.   Chronic Neck Pain:  Differential diagnosis includes, but is not limited to cervical degenerative disc disease, bulging/herniated disc, chronic cervical/muscle strain.  X-ray cervical spine does not show any disc height loss or degeneration per my read, confirmed by radiology Methocarbamol 500 mg p.o. x1 for muscle tension Ibuprofen 800 mg p.o. x1 for pain and inflammation- pt refused Patient reports he takes Meloxicam at home intermittently and when he does his neck pain completely goes away.  When I asked him why he does not take this daily, he reports he was advised by his PCP did not take anti-inflammatories consistently because he has chronic kidney disease.  Upon chart review, his last creatinine was 1.3, GFR 68. RX for Meloxicam 15 mg daily RX for Methocarbamol 500 mg at  bedtime- sedation caution given Encouraged heat, stretching and massage He will follow up with his PCP/orthopedist as an outpatient   FINAL CLINICAL IMPRESSION(S) / ED DIAGNOSES   Final diagnoses:  Chronic neck pain     Rx / DC Orders   ED Discharge Orders          Ordered    meloxicam (MOBIC) 15 MG tablet        04/29/21 1431    methocarbamol (ROBAXIN) 500 MG tablet  At bedtime PRN        04/29/21 1431             Note:  This document was prepared using Dragon voice recognition software and may include unintentional dictation errors.    Lorre Munroe, NP 04/29/21 1432    Shaune Pollack, MD 04/30/21 1515

## 2021-06-25 ENCOUNTER — Other Ambulatory Visit: Payer: Self-pay | Admitting: Internal Medicine

## 2021-06-26 NOTE — Telephone Encounter (Signed)
Provider not at this practice. ?Requested Prescriptions  ?Refused Prescriptions Disp Refills  ?? methocarbamol (ROBAXIN) 500 MG tablet [Pharmacy Med Name: METHOCARBAMOL 500MG  TABLETS] 30 tablet 1  ?  Sig: TAKE 1 TABLET(500 MG) BY MOUTH AT BEDTIME AS NEEDED FOR MUSCLE SPASMS  ?  ? Not Delegated - Analgesics:  Muscle Relaxants Failed - 06/25/2021  3:11 AM  ?  ?  Failed - This refill cannot be delegated  ?  ?  Failed - Valid encounter within last 6 months  ?  Recent Outpatient Visits   ?None ?  ?  ?Future Appointments   ?        ? In 3 weeks Goll[place], 06/27/2021, MD [id number]  ?  ? ?  ?  ?  ? ?

## 2021-07-16 NOTE — Progress Notes (Deleted)
Cardiology Office Note  Date:  07/16/2021   ID:  Trevor Flores, [name] May 23, 1960, MRN [id number]  PCP:  Trevor Flores,   No chief complaint on file.  HPI:  [name] is a very pleasant 61 year old gentleman with a history of hypertension,  lightheadedness,  obesity,  near syncope Tremor hyperlipidemia He presents for follow-up of his hypertension, near syncope  LOV 07/2020   BP at home 120/70 to 80 Takes cardura 1 mg as needed  Presents today with wife Bad tremor, getting worse over the past several years Muscle rigidity, difficulty transitioning from wheelchair to chair on todays visit Tries to walk daily, not able to walk very far  Denies any side effects from his medications Rare flutter chronic knee pain, legs weak Muscles around his knee is getting weaker No orthostasis, no near syncope or syncope  History of fleeting chest pain secondary to muscular ligamental etiology  Lab work reviewed with him Total chol 140, LDL 82  Non-smoker, no diabetes A1c 5.4  EKG personally reviewed by myself on todays visit Shows normal sinus rhythm rate 71 bpm no significant ST-T wave changes, artifact from tremor.  No significant change   PMH:   has a past medical history of Heart murmur, History of stress test, Hyperlipidemia, Hypertension, Palpitations, Possible Lacunar infarction ([place]), Pre-syncope, Resting tremor, and Vertigo.  PSH:    Past Surgical History:  Procedure Laterality Date   APPENDECTOMY     HEMORRHOID BANDING  ?   Dr Nicolasa Ducking    Current Outpatient Medications  Medication Sig Dispense Refill   allopurinol (ZYLOPRIM) 100 MG tablet Take 100 mg by mouth daily.     aspirin EC 81 MG tablet Take 1 tablet (81 mg total) by mouth daily. 90 tablet 3   diltiazem (TIAZAC) 360 MG 24 hr capsule TAKE 1 CAPSULE(360 MG) BY MOUTH DAILY IN THE MORNING 90 capsule 3   doxazosin (CARDURA) 1 MG tablet TAKE 1 TABLET BY MOUTH DAILY AS NEEDED FOR PRESSURE GREATER THAN 150 90 tablet 0    fluticasone (FLONASE) 50 MCG/ACT nasal spray Place into the nose daily.     LINZESS 72 MCG capsule Take 72 mcg by mouth every morning.     loratadine (CLARITIN) 10 MG tablet Take by mouth daily.     losartan (COZAAR) 100 MG tablet Take 1 tablet (100 mg total) by mouth daily. In the morning. 90 tablet 2   meclizine (ANTIVERT) 25 MG tablet Take 1 tablet (25 mg total) by mouth 3 (three) times daily as needed for dizziness. 30 tablet 1   meloxicam (MOBIC) 15 MG tablet TK ONE T PO QD as needed 90 tablet 0   methocarbamol (ROBAXIN) 500 MG tablet Take 1 tablet (500 mg total) by mouth at bedtime as needed for muscle spasms. 30 tablet 1   metoprolol succinate (TOPROL-XL) 25 MG 24 hr tablet Take 1 tablet (25 mg total) by mouth every evening. With food. 90 tablet 2   omeprazole (PRILOSEC) 40 MG capsule Take by mouth.     oxyCODONE-acetaminophen (PERCOCET) 7.5-325 MG tablet Take 1 tablet by mouth every 6 (six) hours as needed.     Plecanatide (TRULANCE) 3 MG TABS Take 3 mg by mouth as needed.     rosuvastatin (CRESTOR) 5 MG tablet Take 5 mg by mouth daily.     No current facility-administered medications for this visit.    Allergies:   Other and Hydralazine   Social History:  The patient  reports that he has never  smoked. His smokeless tobacco use includes chew. He reports that he does not drink alcohol and does not use drugs.   Family History:   family history is not on file.   Review of Systems: Review of Systems  Constitutional: Negative.   HENT: Negative.    Respiratory: Negative.    Cardiovascular: Negative.   Gastrointestinal: Negative.   Musculoskeletal:  Positive for joint pain.  Neurological:  Positive for tremors.  Psychiatric/Behavioral: Negative.    All other systems reviewed and are negative.   PHYSICAL EXAM: VS:  There were no vitals taken for this visit. , BMI There is no height or weight on file to calculate BMI. Constitutional:  oriented to person, place, and time. No  distress.  Difficulty standing and transitioning from chair to wheelchair HENT:  Head: Grossly normal Eyes:  no discharge. No scleral icterus.  Neck: No JVD, no carotid bruits  Cardiovascular: Regular rate and rhythm, no murmurs appreciated Pulmonary/Chest: Clear to auscultation bilaterally, no wheezes or rails Abdominal: Soft.  no distension.  no tenderness.  Musculoskeletal: Normal range of motion Neurological:  normal muscle tone. Coordination normal. No atrophy Skin: Skin warm and dry Psychiatric: normal affect, pleasant   Recent Labs: No results found for requested labs within last 8760 hours.    Lipid Panel No results found for: CHOL, HDL, LDLCALC, TRIG   I pain Wt Readings from Last 3 Encounters:  04/29/21 270 lb (122.5 kg)  07/18/20 274 lb 6 oz (124.5 kg)  07/13/19 274 lb 4 oz (124.4 kg)     ASSESSMENT AND PLAN:  Essential hypertension Blood pressure stable, no changes made  on cardura PRN 1 mg pressure >150  Morbid obesity (HCC) Weight stable  Palpitations, ectopy Rare symptoms No further work-up, no changes to medications  Near syncope BP stable, no orthostasis sx  Tremor/Parkinson's Difficulty ambulating, mobility very stiff, significant tremor    Total encounter time more than 25 minutes  Greater than 50% was spent in counseling and coordination of care with the patient     Signed, Esmond Plants, M.D., Ph.D. 07/16/2021  [place], [place]

## 2021-07-17 ENCOUNTER — Ambulatory Visit: Payer: Medicare Other | Admitting: Cardiovascular Disease

## 2021-07-17 DIAGNOSIS — R079 Chest pain, unspecified: Secondary | ICD-10-CM

## 2021-07-17 DIAGNOSIS — R002 Palpitations: Secondary | ICD-10-CM

## 2021-07-17 DIAGNOSIS — E782 Mixed hyperlipidemia: Secondary | ICD-10-CM

## 2021-07-17 DIAGNOSIS — I10 Essential (primary) hypertension: Secondary | ICD-10-CM

## 2021-07-25 ENCOUNTER — Other Ambulatory Visit: Payer: Self-pay | Admitting: Internal Medicine

## 2021-07-26 NOTE — Telephone Encounter (Signed)
Requested medication (s) are due for refill today:   Yes  Requested medication (s) are on the active medication list:   Yes  Future visit scheduled:   No  Looks like he goes to the [facility] ordered: 04/29/2021 #90, 0 refills  Returned because this was prescribed by another provider Dr. Tracie Harrier at [facility].     Requested Prescriptions  Pending Prescriptions Disp Refills   meloxicam (MOBIC) 15 MG tablet [Pharmacy Med Name: MELOXICAM 15MG TABLETS] 90 tablet 0    Sig: TAKE 1 TABLET BY MOUTH EVERY DAY AS NEEDED     Analgesics:  COX2 Inhibitors Failed - 07/25/2021  3:11 AM      Failed - Manual Review: Labs are only required if the patient has taken medication for more than 8 weeks.      Failed - HGB in normal range and within 360 days    Hemoglobin  Date Value Ref Range Status  02/22/2019 13.8 13.0 - 17.0 g/dL Final   HGB  Date Value Ref Range Status  12/05/2011 15.0 13.0 - 18.0 g/dL Final         Failed - Cr in normal range and within 360 days    Creatinine  Date Value Ref Range Status  12/05/2011 1.38 (H) 0.60 - 1.30 mg/dL Final   Creatinine, Ser  Date Value Ref Range Status  02/22/2019 1.22 0.61 - 1.24 mg/dL Final         Failed - HCT in normal range and within 360 days    HCT  Date Value Ref Range Status  02/22/2019 42.5 39.0 - 52.0 % Final  12/05/2011 43.2 40.0 - 52.0 % Final         Failed - AST in normal range and within 360 days    AST  Date Value Ref Range Status  02/22/2019 18 15 - 41 U/L Final   SGOT(AST)  Date Value Ref Range Status  12/05/2011 26 15 - 37 Unit/L Final         Failed - ALT in normal range and within 360 days    ALT  Date Value Ref Range Status  02/22/2019 17 0 - 44 U/L Final   SGPT (ALT)  Date Value Ref Range Status  12/05/2011 29 12 - 78 U/L Final         Failed - eGFR is 30 or above and within 360 days    EGFR (African American)  Date Value Ref Range Status  12/05/2011 >60  Final   GFR calc Af  Amer  Date Value Ref Range Status  02/22/2019 >60 >60 mL/min Final   EGFR (Non-African Amer.)  Date Value Ref Range Status  12/05/2011 59 (L)  Final    Comment:    eGFR values <66m/min/1.73 m2 may be an indication of chronic kidney disease (CKD). Calculated eGFR is useful in patients with stable renal function. The eGFR calculation will not be reliable in acutely ill patients when serum creatinine is changing rapidly. It is not useful in  patients on dialysis. The eGFR calculation may not be applicable to patients at the low and high extremes of body sizes, pregnant women, and vegetarians.    GFR calc non Af Amer  Date Value Ref Range Status  02/22/2019 >60 >60 mL/min Final         Failed - Valid encounter within last 12 months    Recent Outpatient Visits   None     Future Appointments  In 1 month Gollan, Kathlene November, MD [facility]             Passed - Patient is not pregnant

## 2021-07-28 ENCOUNTER — Other Ambulatory Visit: Payer: Self-pay | Admitting: Cardiovascular Disease

## 2021-09-10 NOTE — Progress Notes (Unsigned)
Cardiology Office Note  Date:  09/11/2021   ID:  Trevor Flores, [name] [birth year], MRN [id number]  PCP:  Marcello Fennel,   Chief Complaint  Patient presents with   Follow-up    12 month F/U-Patient reports feelings heart palpitations a few days ago but he states they have not recurred in the last 1-2 days.   HPI:  Trevor Flores is a very pleasant 61 year old gentleman with a history of hypertension,  lightheadedness,  obesity,  near syncope Tremor hyperlipidemia He presents for follow-up of his hypertension, near syncope  LOV 07/2020 Presenting today with his wife Heart palpitations last week, now resolved Denies any shortness of breath or chest pain Continues to have chronic tremor for many years, " not while he is sleeping" per family  Reports that he walks daily around [employer]  Labs reviewed Total chol 123, LDL 69 CR 1.3  Has tremor Reports that he is followed by neurology May have been diagnosed with Parkinson's, details unclear  BP at home 120/70 to 80 Rarely takes Cardura only for pressure over 150  Presents today with wife  Non-smoker, no diabetes A1c 5.4  EKG personally reviewed by myself on todays visit Shows normal sinus rhythm rate 67 bpm no significant ST-T wave changes, artifact from tremor.  No significant change   PMH:   has a past medical history of Heart murmur, History of stress test, Hyperlipidemia, Hypertension, Palpitations, Possible Lacunar infarction (HCC), Pre-syncope, Resting tremor, and Vertigo.  PSH:    Past Surgical History:  Procedure Laterality Date   APPENDECTOMY     HEMORRHOID BANDING  ?   Dr Maryruth Bun    Current Outpatient Medications  Medication Sig Dispense Refill   allopurinol (ZYLOPRIM) 100 MG tablet Take 100 mg by mouth daily.     aspirin EC 81 MG tablet Take 1 tablet (81 mg total) by mouth daily. 90 tablet 3   diltiazem (TIAZAC) 360 MG 24 hr capsule TAKE 1 CAPSULE(360 MG) BY MOUTH DAILY IN THE MORNING 90 capsule 0    doxazosin (CARDURA) 1 MG tablet TAKE 1 TABLET BY MOUTH DAILY AS NEEDED FOR PRESSURE GREATER THAN 150 90 tablet 0   fluticasone (FLONASE) 50 MCG/ACT nasal spray Place into both nostrils daily as needed for allergies or rhinitis.     LINZESS 72 MCG capsule Take 72 mcg by mouth every morning.     loratadine (CLARITIN) 10 MG tablet Take 10 mg by mouth daily as needed for allergies.     losartan (COZAAR) 100 MG tablet Take 1 tablet (100 mg total) by mouth daily. In the morning. 90 tablet 2   meclizine (ANTIVERT) 25 MG tablet Take 1 tablet (25 mg total) by mouth 3 (three) times daily as needed for dizziness. 30 tablet 1   meloxicam (MOBIC) 15 MG tablet TK ONE T PO QD as needed 90 tablet 0   methocarbamol (ROBAXIN) 500 MG tablet Take 1 tablet (500 mg total) by mouth at bedtime as needed for muscle spasms. 30 tablet 1   metoprolol succinate (TOPROL-XL) 25 MG 24 hr tablet Take 1 tablet (25 mg total) by mouth every evening. With food. 90 tablet 2   omeprazole (PRILOSEC) 40 MG capsule Take 40 mg by mouth daily as needed.     oxyCODONE-acetaminophen (PERCOCET) 7.5-325 MG tablet Take 1 tablet by mouth every 6 (six) hours as needed.     Plecanatide (TRULANCE) 3 MG TABS Take 3 mg by mouth as needed.     rosuvastatin (CRESTOR) 5  MG tablet Take 5 mg by mouth daily.     No current facility-administered medications for this visit.    Allergies:   Other and Hydralazine   Social History:  The patient  reports that he has never smoked. His smokeless tobacco use includes chew. He reports that he does not drink alcohol and does not use drugs.   Family History:   family history includes Diabetes in his brother.   Review of Systems: Review of Systems  Constitutional: Negative.   HENT: Negative.    Respiratory: Negative.    Cardiovascular: Negative.   Gastrointestinal: Negative.   Musculoskeletal:  Positive for joint pain.  Neurological:  Positive for tremors.  Psychiatric/Behavioral: Negative.    All other  systems reviewed and are negative.   PHYSICAL EXAM: VS:  BP 140/80 (BP Location: Left Arm, Patient Position: Sitting, Cuff Size: Large)   Pulse 67   Ht 6' (1.829 m)   Wt 267 lb (121.1 kg)   SpO2 98%   BMI 36.21 kg/m  , BMI Body mass index is 36.21 kg/m. Constitutional:  oriented to person, place, and time. No distress.  HENT:  Head: Grossly normal Eyes:  no discharge. No scleral icterus.  Neck: No JVD, no carotid bruits  Cardiovascular: Regular rate and rhythm, no murmurs appreciated Pulmonary/Chest: Clear to auscultation bilaterally, no wheezes or rails Abdominal: Soft.  no distension.  no tenderness.  Musculoskeletal: Normal range of motion Neurological:  normal muscle tone. Coordination normal. No atrophy Skin: Skin warm and dry Psychiatric: normal affect, pleasant  Recent Labs: No results found for requested labs within last 365 days.    Lipid Panel No results found for: "CHOL", "HDL", "LDLCALC", "TRIG"   I pain Wt Readings from Last 3 Encounters:  09/11/21 267 lb (121.1 kg)  04/29/21 270 lb (122.5 kg)  07/18/20 274 lb 6 oz (124.5 kg)     ASSESSMENT AND PLAN:  Essential hypertension Reasonable blood pressure, continue metoprolol, diltiazem ER  on cardura PRN 1 mg pressure >150  Morbid obesity (HCC) Continues to walk daily, weight slowly trending downward  Palpitations, ectopy Rare symptoms Reports he is only taking half a dose metoprolol Recommended other half of his metoprolol for days when he has palpitations Would also try magnesium for PVCs seen previously on monitor  Near syncope BP stable, no orthostasis sx Overall reports feeling well  Tremor/Parkinson's Reports things are stable, walks daily Significant tremor Reports he is followed by neurology    Total encounter time more than 30 minutes  Greater than 50% was spent in counseling and coordination of care with the patient     Signed, Dossie Arbour, M.D., Ph.D. 09/11/2021  [facility] [place], [place] [phone]

## 2021-09-11 ENCOUNTER — Ambulatory Visit: Payer: Medicare Other | Admitting: Cardiovascular Disease

## 2021-09-11 ENCOUNTER — Encounter: Payer: Self-pay | Admitting: Cardiovascular Disease

## 2021-09-11 VITALS — BP 140/80 | HR 67 | Ht 72.0 in | Wt 267.0 lb

## 2021-09-11 DIAGNOSIS — I10 Essential (primary) hypertension: Secondary | ICD-10-CM

## 2021-09-11 DIAGNOSIS — R079 Chest pain, unspecified: Secondary | ICD-10-CM

## 2021-09-11 DIAGNOSIS — R002 Palpitations: Secondary | ICD-10-CM | POA: Diagnosis not present

## 2021-09-11 DIAGNOSIS — R55 Syncope and collapse: Secondary | ICD-10-CM

## 2021-09-11 DIAGNOSIS — E782 Mixed hyperlipidemia: Secondary | ICD-10-CM | POA: Diagnosis not present

## 2021-09-11 MED ORDER — DOXAZOSIN MESYLATE 1 MG PO TABS: ORAL_TABLET | ORAL | 0 refills | Status: DC

## 2021-09-11 NOTE — Patient Instructions (Addendum)
Medication Instructions:  No changes  For constipation try to spoonfuls of MiraLAX mixed with 2 spoonfuls of Citrucel daily  If you need a refill on your cardiac medications before your next appointment, please call your pharmacy.   Lab work: No new labs needed  Testing/Procedures: No new testing needed  Follow-Up: At [facility], you and your health needs are our priority.  As part of our continuing mission to provide you with exceptional heart care, we have created designated Provider Care Teams.  These Care Teams include your primary Cardiologist (physician) and Advanced Practice Providers (APPs -  Physician Assistants and Nurse Practitioners) who all work together to provide you with the care you need, when you need it.  You will need a follow up appointment in 12 months  Providers on your designated Care Team:   Nicolasa Ducking, NP Eula Listen, PA-C Cadence Fransico [place], [place]  COVID-19 Vaccine Information can be found at: [email or web address] For questions related to vaccine distribution or appointments, please email [email or web address] or call [phone].

## 2021-10-17 ENCOUNTER — Telehealth: Payer: Self-pay | Admitting: Cardiovascular Disease

## 2021-10-17 MED ORDER — LOSARTAN POTASSIUM 100 MG PO TABS: 100.0000 mg | ORAL_TABLET | Freq: Every day | ORAL | 2 refills | Status: DC

## 2021-10-17 MED ORDER — METOPROLOL SUCCINATE ER 25 MG PO TB24: 25.0000 mg | ORAL_TABLET | Freq: Every evening | ORAL | 2 refills | Status: DC

## 2021-10-17 NOTE — Telephone Encounter (Signed)
*  STAT* If patient is at the pharmacy, call can be transferred to refill team.   1. Which medications need to be refilled? (please list name of each medication and dose if known) Metoprolol and Losartan  2. Which pharmacy/location (including street and city if local pharmacy) is medication to be sent to? [facility] [place]   3. Do they need a 30 day or 90 day supply? 90 days and refills

## 2021-10-25 ENCOUNTER — Other Ambulatory Visit: Payer: Self-pay | Admitting: Cardiovascular Disease

## 2021-12-10 ENCOUNTER — Other Ambulatory Visit: Payer: Self-pay

## 2021-12-10 MED ORDER — DOXAZOSIN MESYLATE 1 MG PO TABS: ORAL_TABLET | ORAL | 0 refills | Status: DC

## 2022-01-21 ENCOUNTER — Telehealth: Payer: Self-pay | Admitting: Cardiovascular Disease

## 2022-01-21 NOTE — Telephone Encounter (Signed)
Pt called to report his experienced one episode of elevated BP (readings listed below) but took PRN cardura and BP decreased. Pt questioning if he should continue to take Cardura.  Nurse informed pt to only take it if systolic is greater than 150 as recommend by MD and to continue to monitor. Nurse advised pt to contact office if he notice BP is consistently staying greater than 150. Pt verbalized understanding.  11/19-159/200 11/19-149/100 11/19-120/80 -- after taking PRN Cardura 11/19-115/71 11/20-127/80 -- this morning  Will forward to MD for any further recommendations.

## 2022-01-21 NOTE — Telephone Encounter (Signed)
Pt c/o BP issue: STAT if pt c/o blurred vision, one-sided weakness or slurred speech  1. What are your last 5 BP readings?  159/200 149/100 120/80 -- after taking med Dr. Mariah Milling recommended 115/71 127/80 -- this morning   2. Are you having any other symptoms (ex. Dizziness, headache, blurred vision, passed out)? Yesterday after it started he had dizzy spell.  3. What is your BP issue?  Pt states that he was told recently by Dr. Mariah Milling to take an extra medication for his BP and he is wanting to know if he should continue this. Please advise.

## 2022-01-21 NOTE — Telephone Encounter (Signed)
Pt updated and verbalized understanding.   Antonieta Iba, MD  [facility] Triage3 minutes ago (1:42 PM)    I would agree, would take Cardura as needed for systolic over 150 If having to take Cardura on a regular basis to keep blood pressures low, would call the office At that point may need to add Cardura on a daily basis Thx TGollan

## 2022-01-26 ENCOUNTER — Emergency Department: Payer: Medicare Other

## 2022-01-26 ENCOUNTER — Emergency Department
Admission: EM | Admit: 2022-01-26 | Discharge: 2022-01-26 | Disposition: A | Discharge status: Home / Self Care | Payer: Medicare Other | Attending: Emergency Medicine | Admitting: Emergency Medicine

## 2022-01-26 ENCOUNTER — Other Ambulatory Visit: Payer: Self-pay

## 2022-01-26 DIAGNOSIS — K2900 Acute gastritis without bleeding: Secondary | ICD-10-CM | POA: Diagnosis not present

## 2022-01-26 DIAGNOSIS — R1010 Upper abdominal pain, unspecified: Secondary | ICD-10-CM | POA: Diagnosis present

## 2022-01-26 DIAGNOSIS — I10 Essential (primary) hypertension: Secondary | ICD-10-CM | POA: Insufficient documentation

## 2022-01-26 LAB — COMPREHENSIVE METABOLIC PANEL
ALT: 10 U/L (ref 0–44)
AST: 21 U/L (ref 15–41)
Albumin: 4.5 g/dL (ref 3.5–5.0)
Alkaline Phosphatase: 86 U/L (ref 38–126)
Anion gap: 10 (ref 5–15)
BUN: 13 mg/dL (ref 6–20)
CO2: 27 mmol/L (ref 22–32)
Calcium: 9.5 mg/dL (ref 8.9–10.3)
Chloride: 103 mmol/L (ref 98–111)
Creatinine, Ser: 1.17 mg/dL (ref 0.61–1.24)
GFR, Estimated: >60 mL/min (ref >60)
Glucose, Bld: 110 mg/dL — ABNORMAL HIGH (ref 70–99)
Potassium: 3.7 mmol/L (ref 3.5–5.1)
Sodium: 140 mmol/L (ref 135–145)
Total Bilirubin: 0.9 mg/dL (ref 0.3–1.2)
Total Protein: 9 g/dL — ABNORMAL HIGH (ref 6.5–8.1)

## 2022-01-26 LAB — CBC
HCT: 42.7 % (ref 39.0–52.0)
Hemoglobin: 14 g/dL (ref 13.0–17.0)
MCH: 27.7 pg (ref 26.0–34.0)
MCHC: 32.8 g/dL (ref 30.0–36.0)
MCV: 84.4 fL (ref 80.0–100.0)
Platelets: 230 10*3/uL (ref 150–400)
RBC: 5.06 MIL/uL (ref 4.22–5.81)
RDW: 13.4 % (ref 11.5–15.5)
WBC: 4.6 10*3/uL (ref 4.0–10.5)
nRBC: 0 % (ref 0.0–0.2)

## 2022-01-26 LAB — URINALYSIS, ROUTINE W REFLEX MICROSCOPIC
Bacteria, UA: NONE SEEN
Bilirubin Urine: NEGATIVE
Glucose, UA: NEGATIVE mg/dL
Ketones, ur: NEGATIVE mg/dL
Leukocytes,Ua: NEGATIVE
Nitrite: NEGATIVE
Protein, ur: 30 mg/dL — AB
Specific Gravity, Urine: 1.018 (ref 1.005–1.030)
pH: 5 (ref 5.0–8.0)

## 2022-01-26 LAB — LIPASE, BLOOD: Lipase: 49 U/L (ref 11–51)

## 2022-01-26 MED ORDER — PANTOPRAZOLE SODIUM 40 MG PO TBEC: 40.0000 mg | DELAYED_RELEASE_TABLET | Freq: Every day | ORAL | 0 refills | Status: AC

## 2022-01-26 NOTE — Discharge Instructions (Signed)
Call make appoint with Dr. Tobi Bastos who is the stomach specialist on-call.  His phone number and address are listed on your discharge papers.  Also the prescription that we talked about was sent to your pharmacy to begin taking 1 daily.  Discontinue taking the Tums that you are currently taking to see if this medication is helping more.  Avoid any spicy foods or foods that have been fried or greasy also to see if this helps.

## 2022-01-26 NOTE — ED Provider Notes (Signed)
[facility] Provider Note    Event Date/Time   First MD Initiated Contact with Patient 01/26/22 1032     (approximate)   History   Abdominal Pain   HPI  Trevor Flores is a 61 y.o. male   presents to ED with complaint of mid upper abdominal pain for the last 2 weeks.  Patient denies any nausea, vomiting or diarrhea.  He reports that prior to eating food he has been taking Tums but does not give a specific reason why he does this.  He states he has been doing this for some time.  He does report some reflux symptoms but does not see increase in symptoms with fried or greasy food.  Patient states that the pain has been intermittent and not constant or radiating.  Patient has history of hypertension, past history of lacunar infarct, palpitations and appendectomy.      Physical Exam   Triage Vital Signs: ED Triage Vitals  Enc Vitals Group     BP 01/26/22 1003 (!) 157/115     Pulse Rate 01/26/22 1003 73     Resp 01/26/22 1003 16     Temp 01/26/22 1003 98.5 F (36.9 C)     Temp src --      SpO2 01/26/22 1003 100 %     Weight 01/26/22 1004 255 lb (115.7 kg)     Height 01/26/22 1004 6' (1.829 m)     Head Circumference --      Peak Flow --      Pain Score 01/26/22 1008 4     Pain Loc --      Pain Edu? --      Excl. in GC? --     Most recent vital signs: Vitals:   01/26/22 1130 01/26/22 1230  BP: 139/84 (!) 122/96  Pulse: 73 65  Resp:  16  Temp:    SpO2: 100% 95%     General: Awake, no distress.  CV:  Good peripheral perfusion.  Resp:  Normal effort.  Abd:  No distention.  On palpation there is some tenderness noted in the epigastric and right upper quadrant area.  No tenderness is noted to the remainder of the abdomen.  Bowel sounds normoactive x 4 quadrants. Other:     ED Results / Procedures / Treatments   Labs (all labs ordered are listed, but only abnormal results are displayed) Labs Reviewed  COMPREHENSIVE METABOLIC PANEL -  Abnormal; Notable for the following components:      Result Value   Glucose, Bld 110 (*)    Total Protein 9.0 (*)    All other components within normal limits  URINALYSIS, ROUTINE W REFLEX MICROSCOPIC - Abnormal; Notable for the following components:   Color, Urine YELLOW (*)    APPearance CLEAR (*)    Hgb urine dipstick SMALL (*)    Protein, ur 30 (*)    All other components within normal limits  LIPASE, BLOOD  CBC     EKG  Vent. rate 82 BPM PR interval 200 ms QRS duration 82 ms QT/QTcB 456/532 ms P-R-T axes 23 15 14  Normal sinus rhythm Cannot rule out Anterior infarct , age undetermined Abnormal ECG When compared with ECG of 03-Feb-2016 03:22, ST now depressed in Inferior leads Nonspecific T wave abnormality now evident in Inferior leads Nonspecific T wave abnormality, worse in Anterolateral leads QT has lengthened   RADIOLOGY  Ultrasound right upper quadrant per radiologist is negative for acute findings.  Patient has cyst to the right kidney.  No gallbladder disease or stones are noted.  Hepatic steatosis.    PROCEDURES:  Critical Care performed:   Procedures   MEDICATIONS ORDERED IN ED: Medications - No data to display   IMPRESSION / MDM / ASSESSMENT AND PLAN / ED COURSE  I reviewed the triage vital signs and the nursing notes.   Differential diagnosis includes, but is not limited to, right upper quadrant pain, gastritis, GERD, gallstones, gallbladder disease, urinary tract infection.  62 year old male presents to the ED with complaint of right upper quadrant pain intermittently for the last 2 weeks.  Patient denies any nausea, vomiting or diarrhea.  He has been taking Tums for some reflux symptoms but denies difficulty eating and no increased problems when eating fried foods or anything greasy.  Lab work is reassuring with WBC 4.6 and CMP within normal limits.  Urinalysis was clear.  Ultrasound did not show any acute findings and ruled out gallbladder  disease and stones.  Patient was made aware.  He is instructed to discontinue taking the Tums and a prescription for Protonix 40 mg was sent to the pharmacy for him to begin taking once a day.  He is also to follow-up with Dr. Tobi Bastos who is the gastroenterologist on-call today.  He will return to the emergency department if any severe worsening of his symptoms.      Patient's presentation is most consistent with acute complicated illness / injury requiring diagnostic workup.  FINAL CLINICAL IMPRESSION(S) / ED DIAGNOSES   Final diagnoses:  Acute gastritis without hemorrhage, unspecified gastritis type     Rx / DC Orders   ED Discharge Orders          Ordered    pantoprazole (PROTONIX) 40 MG tablet  Daily        01/26/22 1226             Note:  This document was prepared using Dragon voice recognition software and may include unintentional dictation errors.   Tommi Rumps, PA-C 01/26/22 1322    Pilar Jarvis, MD 01/26/22 1455

## 2022-01-26 NOTE — ED Triage Notes (Signed)
Pt to ED for mid upper abd pain x2 weeks. Denies n/v/d. States last bm yesterday.

## 2022-01-26 NOTE — ED Notes (Signed)
Pt to ED for intermittent generalized abdominal pain since 2 weeks ago. No pain at this moment. VS rechecked.

## 2022-04-16 ENCOUNTER — Other Ambulatory Visit: Payer: Self-pay | Admitting: Cardiovascular Disease

## 2022-07-25 ENCOUNTER — Encounter: Payer: Self-pay | Admitting: Gastroenterology

## 2022-08-01 ENCOUNTER — Encounter: Admission: RE | Payer: Self-pay | Source: Home / Self Care

## 2022-08-01 ENCOUNTER — Encounter: Payer: Self-pay | Admitting: Gastroenterology

## 2022-08-01 ENCOUNTER — Ambulatory Visit: Admission: RE | Admit: 2022-08-01 | Payer: Medicare Other | Source: Home / Self Care | Admitting: Gastroenterology

## 2022-08-01 SURGERY — COLONOSCOPY
Anesthesia: General

## 2022-10-13 ENCOUNTER — Emergency Department
Admission: EM | Admit: 2022-10-13 | Discharge: 2022-10-13 | Disposition: A | Discharge status: Home / Self Care | Payer: Medicare Other | Source: Home / Self Care | Attending: Emergency Medicine | Admitting: Emergency Medicine

## 2022-10-13 ENCOUNTER — Other Ambulatory Visit: Payer: Self-pay

## 2022-10-13 ENCOUNTER — Encounter: Payer: Self-pay | Admitting: Emergency Medicine

## 2022-10-13 DIAGNOSIS — R1013 Epigastric pain: Secondary | ICD-10-CM | POA: Diagnosis present

## 2022-10-13 DIAGNOSIS — K2100 Gastro-esophageal reflux disease with esophagitis, without bleeding: Secondary | ICD-10-CM | POA: Insufficient documentation

## 2022-10-13 HISTORY — DX: Parkinson's disease without dyskinesia, without mention of fluctuations: G20.A1

## 2022-10-13 LAB — COMPREHENSIVE METABOLIC PANEL
ALT: 13 U/L (ref 0–44)
AST: 17 U/L (ref 15–41)
Albumin: 3.9 g/dL (ref 3.5–5.0)
Alkaline Phosphatase: 99 U/L (ref 38–126)
Anion gap: 9 (ref 5–15)
BUN: 16 mg/dL (ref 8–23)
CO2: 25 mmol/L (ref 22–32)
Calcium: 8.9 mg/dL (ref 8.9–10.3)
Chloride: 102 mmol/L (ref 98–111)
Creatinine, Ser: 1.28 mg/dL — ABNORMAL HIGH (ref 0.61–1.24)
GFR, Estimated: >60 mL/min (ref >60)
Glucose, Bld: 102 mg/dL — ABNORMAL HIGH (ref 70–99)
Potassium: 3.9 mmol/L (ref 3.5–5.1)
Sodium: 136 mmol/L (ref 135–145)
Total Bilirubin: 0.7 mg/dL (ref 0.3–1.2)
Total Protein: 8.2 g/dL — ABNORMAL HIGH (ref 6.5–8.1)

## 2022-10-13 LAB — CBC WITH DIFFERENTIAL/PLATELET
Abs Immature Granulocytes: 0.02 10*3/uL (ref 0.00–0.07)
Basophils Absolute: 0 10*3/uL (ref 0.0–0.1)
Basophils Relative: 1 %
Eosinophils Absolute: 0 10*3/uL (ref 0.0–0.5)
Eosinophils Relative: 1 %
HCT: 38.5 % — ABNORMAL LOW (ref 39.0–52.0)
Hemoglobin: 12.2 g/dL — ABNORMAL LOW (ref 13.0–17.0)
Immature Granulocytes: 0 %
Lymphocytes Relative: 29 %
Lymphs Abs: 1.4 10*3/uL (ref 0.7–4.0)
MCH: 27.5 pg (ref 26.0–34.0)
MCHC: 31.7 g/dL (ref 30.0–36.0)
MCV: 86.7 fL (ref 80.0–100.0)
Monocytes Absolute: 0.5 10*3/uL (ref 0.1–1.0)
Monocytes Relative: 9 %
Neutro Abs: 3 10*3/uL (ref 1.7–7.7)
Neutrophils Relative %: 60 %
Platelets: 215 10*3/uL (ref 150–400)
RBC: 4.44 MIL/uL (ref 4.22–5.81)
RDW: 13.2 % (ref 11.5–15.5)
WBC: 4.9 10*3/uL (ref 4.0–10.5)
nRBC: 0 % (ref 0.0–0.2)

## 2022-10-13 LAB — COLOGUARD: COLOGUARD: POSITIVE — AB

## 2022-10-13 LAB — LIPASE, BLOOD: Lipase: 48 U/L (ref 11–51)

## 2022-10-13 LAB — LACTIC ACID, PLASMA: Lactic Acid, Venous: 1 mmol/L (ref 0.5–1.9)

## 2022-10-13 MED ORDER — DICYCLOMINE HCL 10 MG PO CAPS
10.0000 mg | ORAL_CAPSULE | Freq: Once | ORAL | Status: AC
  Administered 2022-10-13: 10 mg via ORAL
  Filled 2022-10-13: qty 1

## 2022-10-13 MED ORDER — SUCRALFATE 1 G PO TABS: 1.0000 g | ORAL_TABLET | Freq: Four times a day (QID) | ORAL | 1 refills | Status: DC

## 2022-10-13 NOTE — ED Provider Notes (Signed)
[facility] Provider Note   Event Date/Time   First MD Initiated Contact with Patient 10/13/22 1159     (approximate) History  Abdominal Pain  HPI Trevor Flores is a 62 y.o. male who presents for abdominal pain.  Patient states that this is midepigastric 5/10, nonradiating burning abdominal pain that is present constantly and worse when getting up from a chair.  Patient also states that there is some slight worsening after eating and he notices the pain when laying down is worsened after eating as well.  Patient states he is a history of GERD and has been taking his medications on time and as prescribed.  Patient denies any worsening with exertion, associated shortness of breath, palpitations, or diaphoresis ROS: Patient currently denies any vision changes, tinnitus, difficulty speaking, facial droop, sore throat, chest pain, shortness of breath, abdominal pain, nausea/vomiting/diarrhea, dysuria, or weakness/numbness/paresthesias in any extremity   Physical Exam  Triage Vital Signs: ED Triage Vitals  Encounter Vitals Group     BP 10/13/22 1141 (!) 151/95     Systolic BP Percentile --      Diastolic BP Percentile --      Pulse Rate 10/13/22 1141 70     Resp 10/13/22 1141 18     Temp 10/13/22 1141 (!) 97.5 F (36.4 C)     Temp Source 10/13/22 1141 Oral     SpO2 10/13/22 1141 100 %     Weight 10/13/22 1139 238 lb (108 kg)     Height 10/13/22 1139 6' (1.829 m)     Head Circumference --      Peak Flow --      Pain Score 10/13/22 1139 5     Pain Loc --      Pain Education --      Exclude from Growth Chart --    Most recent vital signs: Vitals:   10/13/22 1430 10/13/22 1447  BP:  126/88  Pulse: 62 63  Resp:  18  Temp:    SpO2: 100% 100%   General: Awake, oriented x4. CV:  Good peripheral perfusion.  Resp:  Normal effort.  Abd:  No distention.  Other:  Elderly overweight African-American male laying in bed in no acute distress ED Results /  Procedures / Treatments  Labs (all labs ordered are listed, but only abnormal results are displayed) Labs Reviewed  COMPREHENSIVE METABOLIC PANEL - Abnormal; Notable for the following components:      Result Value   Glucose, Bld 102 (*)    Creatinine, Ser 1.28 (*)    Total Protein 8.2 (*)    All other components within normal limits  CBC WITH DIFFERENTIAL/PLATELET - Abnormal; Notable for the following components:   Hemoglobin 12.2 (*)    HCT 38.5 (*)    All other components within normal limits  LACTIC ACID, PLASMA  LIPASE, BLOOD   EKG ED ECG REPORT I, Merwyn Katos, the attending physician, personally viewed and interpreted this ECG. Date: 10/13/2022 EKG Time: 1246 Rate: 73 Rhythm: normal sinus rhythm QRS Axis: normal Intervals: normal ST/T Wave abnormalities: normal Narrative Interpretation: no evidence of acute ischemia PROCEDURES: Critical Care performed: No .1-3 Lead EKG Interpretation  Performed by: Merwyn Katos, MD Authorized by: Merwyn Katos, MD     Interpretation: normal     ECG rate:  71   ECG rate assessment: normal     Rhythm: sinus rhythm     Ectopy: none     Conduction: normal  MEDICATIONS ORDERED IN ED: Medications  dicyclomine (BENTYL) capsule 10 mg (10 mg Oral Given 10/13/22 1247)   IMPRESSION / MDM / ASSESSMENT AND PLAN / ED COURSE  I reviewed the triage vital signs and the nursing notes.                             The patient is on the cardiac monitor to evaluate for evidence of arrhythmia and/or significant heart rate changes Patient's presentation is most consistent with acute presentation with potential threat to life or bodily function. Patient presents for abdominal pain.  Differential diagnosis includes appendicitis, abdominal aortic aneurysm, surgical biliary disease, pancreatitis, SBO, mesenteric ischemia, serious intra-abdominal bacterial illness, genital torsion. Doubt atypical ACS. Based on history, physical exam,  radiologic/laboratory evaluation, there is no red flag results or symptomatology requiring emergent intervention or need for admission at this time Pt tolerating PO. Disposition: Patient will be discharged with strict return precautions and follow up with primary MD within 12-24 hours for further evaluation. Patient understands that this still may have an early presentation of an emergent medical condition such as appendicitis that will require a recheck.   FINAL CLINICAL IMPRESSION(S) / ED DIAGNOSES   Final diagnoses:  Epigastric pain  Gastroesophageal reflux disease with esophagitis without hemorrhage   Rx / DC Orders   ED Discharge Orders          Ordered    sucralfate (CARAFATE) 1 g tablet  4 times daily        10/13/22 1422           Note:  This document was prepared using Dragon voice recognition software and may include unintentional dictation errors.   Merwyn Katos, MD 10/13/22 [phone]

## 2022-10-13 NOTE — ED Triage Notes (Signed)
Patient to ED via POV for RUQ abd pain. Pain ongoing x3 months. Denies N/V/D. Hurts worse when getting up out of chair.

## 2022-10-15 ENCOUNTER — Ambulatory Visit: Payer: Medicare Other

## 2022-10-22 ENCOUNTER — Ambulatory Visit: Payer: Medicare Other

## 2022-10-29 ENCOUNTER — Ambulatory Visit: Payer: Medicare Other

## 2022-10-31 ENCOUNTER — Ambulatory Visit: Payer: Medicare Other

## 2022-11-05 ENCOUNTER — Ambulatory Visit: Payer: Medicare Other | Admitting: Physical Therapy

## 2022-11-12 ENCOUNTER — Ambulatory Visit: Payer: Medicare Other | Admitting: Physical Therapy

## 2022-11-14 ENCOUNTER — Ambulatory Visit: Payer: Medicare Other

## 2022-11-19 ENCOUNTER — Ambulatory Visit: Payer: Medicare Other | Admitting: Physical Therapy

## 2022-11-21 ENCOUNTER — Ambulatory Visit: Payer: Medicare Other

## 2022-11-26 ENCOUNTER — Ambulatory Visit: Payer: Medicare Other | Admitting: Physical Therapy

## 2022-11-28 ENCOUNTER — Ambulatory Visit: Payer: Medicare Other

## 2022-12-03 ENCOUNTER — Ambulatory Visit: Payer: Medicare Other

## 2022-12-05 ENCOUNTER — Ambulatory Visit: Payer: Medicare Other

## 2022-12-10 ENCOUNTER — Ambulatory Visit: Payer: Medicare Other

## 2022-12-12 ENCOUNTER — Ambulatory Visit: Payer: Medicare Other

## 2022-12-17 ENCOUNTER — Ambulatory Visit: Payer: Medicare Other

## 2022-12-19 ENCOUNTER — Ambulatory Visit: Payer: Medicare Other

## 2022-12-23 ENCOUNTER — Other Ambulatory Visit: Payer: Self-pay | Admitting: Cardiovascular Disease

## 2022-12-23 NOTE — Telephone Encounter (Signed)
Please contact pt for future appointment. Pt overdue for follow up. Pt needing refills.

## 2022-12-24 ENCOUNTER — Telehealth: Payer: Self-pay | Admitting: Cardiovascular Disease

## 2022-12-24 ENCOUNTER — Ambulatory Visit: Payer: Medicare Other

## 2022-12-24 NOTE — Telephone Encounter (Signed)
Left voice mail

## 2022-12-24 NOTE — Telephone Encounter (Signed)
Left voicemail, pt needs to be scheduled from recall.

## 2022-12-26 ENCOUNTER — Ambulatory Visit: Payer: Medicare Other

## 2022-12-26 ENCOUNTER — Telehealth: Payer: Self-pay | Admitting: Cardiovascular Disease

## 2022-12-26 NOTE — Telephone Encounter (Signed)
Pt scheduled on 12/10

## 2022-12-26 NOTE — Telephone Encounter (Signed)
Medication refilled:  Sent to pharmacy as: diltiazem (TIAZAC) 360 MG 24 hr capsule  E-Prescribing Status: Receipt confirmed by pharmacy (12/26/2022  8:51 AM EDT)

## 2022-12-26 NOTE — Telephone Encounter (Signed)
*  STAT* If patient is at the pharmacy, call can be transferred to refill team.   1. Which medications need to be refilled? (please list name of each medication and dose if known)   diltiazem (TIAZAC) 360 MG 24 hr capsule    2. Which pharmacy/location (including street and city if local pharmacy) is medication to be sent to? [id number] - [place],  - [address] AT [facility] OF [address]    3. Do they need a 30 day or 90 day supply? 90 days

## 2022-12-31 ENCOUNTER — Ambulatory Visit: Payer: Medicare Other

## 2023-01-02 ENCOUNTER — Ambulatory Visit: Payer: Medicare Other

## 2023-01-24 ENCOUNTER — Other Ambulatory Visit: Payer: Self-pay | Admitting: Cardiovascular Disease

## 2023-02-04 ENCOUNTER — Encounter
Admission: RE | Disposition: A | Discharge status: Home / Self Care | Payer: Self-pay | Source: Home / Self Care | Attending: Internal Medicine

## 2023-02-04 ENCOUNTER — Ambulatory Visit
Admission: RE | Admit: 2023-02-04 | Discharge: 2023-02-04 | Disposition: A | Discharge status: Home / Self Care | Payer: Medicare Other | Attending: Internal Medicine | Admitting: Internal Medicine

## 2023-02-04 ENCOUNTER — Encounter: Payer: Self-pay | Admitting: Anesthesiology

## 2023-02-04 DIAGNOSIS — K219 Gastro-esophageal reflux disease without esophagitis: Secondary | ICD-10-CM | POA: Insufficient documentation

## 2023-02-04 DIAGNOSIS — Z5309 Procedure and treatment not carried out because of other contraindication: Secondary | ICD-10-CM | POA: Insufficient documentation

## 2023-02-04 DIAGNOSIS — K5909 Other constipation: Secondary | ICD-10-CM | POA: Insufficient documentation

## 2023-02-04 DIAGNOSIS — Z79899 Other long term (current) drug therapy: Secondary | ICD-10-CM | POA: Insufficient documentation

## 2023-02-04 DIAGNOSIS — K921 Melena: Secondary | ICD-10-CM | POA: Diagnosis not present

## 2023-02-04 HISTORY — PX: ESOPHAGOGASTRODUODENOSCOPY (EGD) WITH PROPOFOL: SHX5813

## 2023-02-04 HISTORY — PX: COLONOSCOPY WITH PROPOFOL: SHX5780

## 2023-02-04 SURGERY — COLONOSCOPY WITH PROPOFOL
Anesthesia: General

## 2023-02-04 MED ORDER — PROPOFOL 1000 MG/100ML IV EMUL
INTRAVENOUS | Status: AC
  Filled 2023-02-04: qty 100

## 2023-02-04 MED ORDER — GLYCOPYRROLATE 0.2 MG/ML IJ SOLN
INTRAMUSCULAR | Status: AC
  Filled 2023-02-04: qty 1

## 2023-02-04 NOTE — Progress Notes (Signed)
Patient not clean will need to reschedule

## 2023-02-04 NOTE — H&P (Signed)
Outpatient short stay form Pre-procedure 02/04/2023 8:22 AM Calem Cocozza K. Norma Fredrickson, M.D.  Primary Physician: Barbette Reichmann, M.D.  Reason for visit:  Constipation, Positive stool cologuard, epigastric pain, GERD  History of present illness:  1. Epigastric pain-he has improved with taking Carafate for 1 week, he is not currently having any epigastric pain and off carafate. He previously had an EGD arranged but had issues prepping with colonoscopy as below and EGD was canceled as well. Will reschedule EGD to exclude any gastritis, ulcers, etc. If normal would recommend decreasing his PPI back to once a day. He does not use nsaids, non smoker. No alcohol.   2. Positive Cologuard-patient reports seeing obvious blood in his stool he feels is due to hemorrhoids when Cologuard was collected. He had issues with the last prep, he initially called and stated the prep did not work but on further discussion he took the prep incorrectly and did not take any prep the night before procedure. He only started a couple hours before procedure then had diarrhea after time procedure was scheduled for. We reviewed prep in detail.  3. Chronic constipation-has been on Linzess 72 mcg with improvement but not resolution of symptoms. Will try increasing to 145 mcg. He tried Trulance with no improvement. If continues to have constipation would recommend increasing to 290 mcg. He will let me know if we need to increase dose. Did review needs to be moving bowels well prior to colonoscopy prep.    No current facility-administered medications for this encounter.  Medications Prior to Admission  Medication Sig Dispense Refill Last Dose   allopurinol (ZYLOPRIM) 100 MG tablet Take 100 mg by mouth daily.      aspirin EC 81 MG tablet Take 1 tablet (81 mg total) by mouth daily. 90 tablet 3    diltiazem (TIAZAC) 360 MG 24 hr capsule TAKE 1 CAPSULE(360 MG) BY MOUTH DAILY IN THE MORNING 30 capsule 0    doxazosin (CARDURA) 1 MG tablet TAKE  1 TABLET BY MOUTH DAILY AS NEEDED FOR PRESSURE GREATER THAN 150 90 tablet 0    fluticasone (FLONASE) 50 MCG/ACT nasal spray Place into both nostrils daily as needed for allergies or rhinitis.      LINZESS 72 MCG capsule Take 72 mcg by mouth every morning.      loratadine (CLARITIN) 10 MG tablet Take 10 mg by mouth daily as needed for allergies.      losartan (COZAAR) 100 MG tablet TAKE 1 TABLET(100 MG) BY MOUTH DAILY IN THE MORNING 90 tablet 2    meclizine (ANTIVERT) 25 MG tablet Take 1 tablet (25 mg total) by mouth 3 (three) times daily as needed for dizziness. 30 tablet 1    meloxicam (MOBIC) 15 MG tablet TK ONE T PO QD as needed 90 tablet 0    methocarbamol (ROBAXIN) 500 MG tablet Take 1 tablet (500 mg total) by mouth at bedtime as needed for muscle spasms. 30 tablet 1    metoprolol succinate (TOPROL-XL) 25 MG 24 hr tablet TAKE 1 TABLET(25 MG) BY MOUTH EVERY EVENING WITH FOOD 90 tablet 2    omeprazole (PRILOSEC) 40 MG capsule Take 40 mg by mouth daily as needed.      oxyCODONE-acetaminophen (PERCOCET) 7.5-325 MG tablet Take 1 tablet by mouth every 6 (six) hours as needed.      pantoprazole (PROTONIX) 40 MG tablet Take 1 tablet (40 mg total) by mouth daily. 30 tablet 0    Plecanatide (TRULANCE) 3 MG TABS Take 3 mg by  mouth as needed.      rosuvastatin (CRESTOR) 5 MG tablet Take 5 mg by mouth daily.      sucralfate (CARAFATE) 1 g tablet Take 1 tablet (1 g total) by mouth 4 (four) times daily. 120 tablet 1      Allergies  Allergen Reactions   Other    Hydralazine Palpitations     Past Medical History:  Diagnosis Date   Heart murmur    a. 12/2011 Echo: EF 60%, no rwma.   History of stress test    a. 04/2004 Ex MV: Ex time 10:30. Hypertensive response. No ischemia/infarct.   Hyperlipidemia    Hypertension    Palpitations    Parkinson disease    Possible Lacunar infarction (HCC)    a. 12/2011 Head CT: CSF density lateral to R cerebral peduncle - ? prior lacunar infarct.   Pre-syncope     a. 06/2014 Monitor: Sinus rhythm. No significant arrhythmia.   Resting tremor    Vertigo     Review of systems:  Otherwise negative.    Physical Exam  Gen: Alert, oriented. Appears stated age.  HEENT: [place]/AT. PERRLA. Lungs: CTA, no wheezes. CV: RR nl S1, S2. Abd: soft, benign, no masses. BS+ Ext: No edema. Pulses 2+    Planned procedures: Proceed with EGD and  colonoscopy. The patient understands the nature of the planned procedure, indications, risks, alternatives and potential complications including but not limited to bleeding, infection, perforation, damage to internal organs and possible oversedation/side effects from anesthesia. The patient agrees and gives consent to proceed.  Please refer to procedure notes for findings, recommendations and patient disposition/instructions.     Jeovany Huitron K. Norma Fredrickson, M.D. Gastroenterology 02/04/2023  8:22 AM

## 2023-02-04 NOTE — Interval H&P Note (Signed)
History and Physical Interval Note:  02/04/2023 8:23 AM  Trevor Flores  has presented today for surgery, with the diagnosis of 787.7 (ICD-9-CM) - R19.5 (ICD-10-CM) - Positive colorectal cancer screening using Cologuard test564.00 (ICD-9-CM) - K59.09 (ICD-10-CM) - Chronic constipation530.81 (ICD-9-CM) - K21.9 (ICD-10-CM) - Gastroesophageal reflux disease, unspecified whether esophagitis present789.06 (ICD-9-CM) - R10.13 (ICD-10-CM) - Epigastric pain.  The various methods of treatment have been discussed with the patient and family. After consideration of risks, benefits and other options for treatment, the patient has consented to  Procedure(s): COLONOSCOPY WITH PROPOFOL (N/A) ESOPHAGOGASTRODUODENOSCOPY (EGD) WITH PROPOFOL (N/A) as a surgical intervention.  The patient's history has been reviewed, patient examined, no change in status, stable for surgery.  I have reviewed the patient's chart and labs.  Questions were answered to the patient's satisfaction.     [place], [place]

## 2023-02-04 NOTE — Anesthesia Preprocedure Evaluation (Deleted)
Anesthesia Evaluation    Airway        Dental   Pulmonary           Cardiovascular hypertension,   ECG 10/13/22: Sinus rhythm Short PR interval LAE, consider biatrial enlargement Low voltage, precordial leads Consider anterior infarct   Neuro/Psych  PSYCHIATRIC DISORDERS Anxiety     Vertigo   Neuromuscular disease (Parkinson disease)    GI/Hepatic   Endo/Other    Renal/GU      Musculoskeletal   Abdominal   Peds  Hematology   Anesthesia Other Findings   Reproductive/Obstetrics                             Anesthesia Physical Anesthesia Plan  ASA: 3  Anesthesia Plan: General   Post-op Pain Management:    Induction: Intravenous  PONV Risk Score and Plan: 2 and Propofol infusion, TIVA and Treatment may vary due to age or medical condition  Airway Management Planned: Natural Airway  Additional Equipment:   Intra-op Plan:   Post-operative Plan:   Informed Consent:   Plan Discussed with:   Anesthesia Plan Comments: (Case canceled on DOS for incomplete prep.)        Anesthesia Quick Evaluation

## 2023-02-05 ENCOUNTER — Encounter: Payer: Self-pay | Admitting: Internal Medicine

## 2023-02-11 ENCOUNTER — Ambulatory Visit: Payer: Medicare Other | Admitting: Cardiovascular Disease

## 2023-02-28 ENCOUNTER — Emergency Department
Admission: EM | Admit: 2023-02-28 | Discharge: 2023-02-28 | Disposition: A | Discharge status: Home / Self Care | Payer: Medicare Other | Attending: Emergency Medicine | Admitting: Emergency Medicine

## 2023-02-28 ENCOUNTER — Other Ambulatory Visit: Payer: Self-pay

## 2023-02-28 ENCOUNTER — Emergency Department: Payer: Medicare Other

## 2023-02-28 ENCOUNTER — Telehealth: Payer: Self-pay | Admitting: Cardiovascular Disease

## 2023-02-28 DIAGNOSIS — G20C Parkinsonism, unspecified: Secondary | ICD-10-CM | POA: Insufficient documentation

## 2023-02-28 DIAGNOSIS — M79622 Pain in left upper arm: Secondary | ICD-10-CM | POA: Insufficient documentation

## 2023-02-28 DIAGNOSIS — I10 Essential (primary) hypertension: Secondary | ICD-10-CM | POA: Diagnosis not present

## 2023-02-28 DIAGNOSIS — M79602 Pain in left arm: Secondary | ICD-10-CM | POA: Diagnosis present

## 2023-02-28 LAB — COMPREHENSIVE METABOLIC PANEL
ALT: 19 U/L (ref 0–44)
AST: 37 U/L (ref 15–41)
Albumin: 4 g/dL (ref 3.5–5.0)
Alkaline Phosphatase: 105 U/L (ref 38–126)
Anion gap: 10 (ref 5–15)
BUN: 18 mg/dL (ref 8–23)
CO2: 24 mmol/L (ref 22–32)
Calcium: 8.9 mg/dL (ref 8.9–10.3)
Chloride: 99 mmol/L (ref 98–111)
Creatinine, Ser: 1.37 mg/dL — ABNORMAL HIGH (ref 0.61–1.24)
GFR, Estimated: 59 mL/min — ABNORMAL LOW (ref >60)
Glucose, Bld: 115 mg/dL — ABNORMAL HIGH (ref 70–99)
Potassium: 3.4 mmol/L — ABNORMAL LOW (ref 3.5–5.1)
Sodium: 133 mmol/L — ABNORMAL LOW (ref 135–145)
Total Bilirubin: 0.5 mg/dL (ref <1.2)
Total Protein: 8.2 g/dL — ABNORMAL HIGH (ref 6.5–8.1)

## 2023-02-28 LAB — CBC
HCT: 35.8 % — ABNORMAL LOW (ref 39.0–52.0)
Hemoglobin: 11.6 g/dL — ABNORMAL LOW (ref 13.0–17.0)
MCH: 27.2 pg (ref 26.0–34.0)
MCHC: 32.4 g/dL (ref 30.0–36.0)
MCV: 84 fL (ref 80.0–100.0)
Platelets: 224 10*3/uL (ref 150–400)
RBC: 4.26 MIL/uL (ref 4.22–5.81)
RDW: 14.2 % (ref 11.5–15.5)
WBC: 5.3 10*3/uL (ref 4.0–10.5)
nRBC: 0 % (ref 0.0–0.2)

## 2023-02-28 LAB — TROPONIN I (HIGH SENSITIVITY)
Troponin I (High Sensitivity): 5 ng/L (ref <18)
Troponin I (High Sensitivity): 7 ng/L (ref <18)

## 2023-02-28 NOTE — ED Triage Notes (Signed)
Pt in with co left arm pain states worse when he moves. Denies any chest pain, sent here from [facility] due to cardiac history.

## 2023-02-28 NOTE — ED Notes (Signed)
Pt refused a temp for discharge. Pt requested to be wheeled to front door. This RN ensured his safe departure.

## 2023-02-28 NOTE — Telephone Encounter (Signed)
   Pt c/o of Chest Pain: STAT if active CP, including tightness, pressure, jaw pain, radiating pain to shoulder/upper arm/back, CP unrelieved by Nitro. Symptoms reported of SOB, nausea, vomiting, sweating.  1. Are you having CP right now? No     2. Are you experiencing any other symptoms (ex. SOB, nausea, vomiting, sweating)? Palpitations   3. Is your CP continuous or coming and going? Coming and going   4. Have you taken Nitroglycerin? No    5. How long have you been experiencing CP? A couple days    6. If NO CP at time of call then end call with telling Pt to call back or call 911 if Chest pain returns prior to return call from triage team.

## 2023-02-28 NOTE — Telephone Encounter (Signed)
Spoke to patient and he stated that he was having some "off and on again chest pain for a couple of days" and it only happens "When I move my arm a certain way." Patient denies persistent, intense pain in the center or left side of the chest that may spread to the arms, neck, or jaw. Patient denies sudden onset of difficulty breathing, even with mild exertion, nausea and vomiting,sweating, dizziness or lightheadedness, and fatigue. Appointment made 03/19/23 at 8:50 AM.

## 2023-02-28 NOTE — Discharge Instructions (Signed)
Please take the meloxicam as previously prescribed.  Return to the emergency department immediately if you begin to have chest pain, shortness of breath, jaw pain, or arm pain that does not resolve as it usually does.

## 2023-02-28 NOTE — ED Provider Notes (Cosign Needed)
[facility] Provider Note    Event Date/Time   First MD Initiated Contact with Patient 02/28/23 1517     (approximate)   History   No chief complaint on file.   HPI  Trevor Flores is a 62 y.o. male  with history of vertigo, hypertension, HLD, Parkinson's and as listed in EMR presents to the emergency department for evaluation of left arm pain that started about 2 days ago.  Pain increases with movement at the elbow.  Pain radiates from elbow into forearm.  No known injury.  He has had this pain in the past and was treated with a muscle relaxer which relieved his pain.  Patient denies having had chest pain, shortness of breath, or other concerns since onset of pain.      Physical Exam   Triage Vital Signs: ED Triage Vitals  Encounter Vitals Group     BP 02/28/23 1116 (!) 235/209     Systolic BP Percentile --      Diastolic BP Percentile --      Pulse Rate 02/28/23 1115 (!) 104     Resp 02/28/23 1115 18     Temp 02/28/23 1115 98.4 F (36.9 C)     Temp Source 02/28/23 1115 Oral     SpO2 02/28/23 1115 98 %     Weight 02/28/23 1115 237 lb (107.5 kg)     Height 02/28/23 1115 6' (1.829 m)     Head Circumference --      Peak Flow --      Pain Score 02/28/23 1115 0     Pain Loc --      Pain Education --      Exclude from Growth Chart --     Most recent vital signs: Vitals:   02/28/23 1527 02/28/23 1529  BP: (!) 150/93   Pulse:  69  Resp:  18  Temp:    SpO2:  100%    General: Awake, no distress.  CV:  Good peripheral perfusion.  Resp:  Normal effort.  Abd:  No distention.  Other:  Full range of motion of the left elbow.  No lesions present over the area of pain.  Persistent Parkinson's tremor noted bilateral hands.   ED Results / Procedures / Treatments   Labs (all labs ordered are listed, but only abnormal results are displayed) Labs Reviewed  CBC - Abnormal; Notable for the following components:      Result Value   Hemoglobin  11.6 (*)    HCT 35.8 (*)    All other components within normal limits  COMPREHENSIVE METABOLIC PANEL - Abnormal; Notable for the following components:   Sodium 133 (*)    Potassium 3.4 (*)    Glucose, Bld 115 (*)    Creatinine, Ser 1.37 (*)    Total Protein 8.2 (*)    GFR, Estimated 59 (*)    All other components within normal limits  TROPONIN I (HIGH SENSITIVITY)  TROPONIN I (HIGH SENSITIVITY)     EKG  Normal sinus rhythm with a rate of 65.   RADIOLOGY  Image and radiology report reviewed and interpreted by me. Radiology report consistent with the same.  Chest x-ray negative for acute concerns.  PROCEDURES:  Critical Care performed: No  Procedures   MEDICATIONS ORDERED IN ED:  Medications - No data to display   IMPRESSION / MDM / ASSESSMENT AND PLAN / ED COURSE   I have reviewed the triage note.  Differential diagnosis  includes, but is not limited to, musculoskeletal pain, cardiac event  Patient's presentation is most consistent with acute complicated illness / injury requiring diagnostic workup.  62 year old male presenting to the emergency department for treatment and evaluation of left arm pain.  See HPI for further details.  Exam is overall reassuring.  Serial troponins and EKGs are both reassuring.  He states that the pain in the left arm is no longer present.  He would like to be discharged home and states that he will take meloxicam for a few days if the pain returns.  Strict ER return precautions were discussed and he agrees to return if he develops any concerning symptoms.      FINAL CLINICAL IMPRESSION(S) / ED DIAGNOSES   Final diagnoses:  Pain of left upper extremity     Rx / DC Orders   ED Discharge Orders     None        Note:  This document was prepared using Dragon voice recognition software and may include unintentional dictation errors.   Chinita Pester, FNP 02/28/23 2345

## 2023-02-28 NOTE — ED Provider Triage Note (Addendum)
Emergency Medicine Provider Triage Evaluation Note  INDERPREET POMRENKE , a 62 y.o. male  was evaluated in triage.  Pt complains of left arm pain that began 2 days ago. It has been off and on. Denies swelling. Denies any CP, headache, SOB, vision changes at this time.  Review of Systems  Positive: Left arm pain with movement Negative: CP  Physical Exam  BP (!) 235/209 (BP Location: Left Arm)   Pulse (!) 104   Temp 98.4 F (36.9 C) (Oral)   Resp 18   Ht 6' (1.829 m)   Wt 107.5 kg   SpO2 98%   BMI 32.14 kg/m  Gen:   Awake, no distress   Resp:  Normal effort  MSK:   Moves extremities without difficulty  Other:    Medical Decision Making  Medically screening exam initiated at 11:22 AM.  Appropriate orders placed.  [name] was informed that the remainder of the evaluation will be completed by another provider, this initial triage assessment does not replace that evaluation, and the importance of remaining in the ED until their evaluation is complete.     Cameron Ali, PA-C 02/28/23 1124    Cameron Ali, PA-C 02/28/23 1143

## 2023-02-28 NOTE — ED Notes (Signed)
Pt sent over from [facility] states feels like a spasm, also co chest pain. Pt denies any cardiac symptoms.

## 2023-03-05 DIAGNOSIS — C189 Malignant neoplasm of colon, unspecified: Secondary | ICD-10-CM

## 2023-03-05 DIAGNOSIS — C787 Secondary malignant neoplasm of liver and intrahepatic bile duct: Secondary | ICD-10-CM

## 2023-03-05 HISTORY — DX: Malignant neoplasm of colon, unspecified: C78.7

## 2023-03-05 HISTORY — DX: Malignant neoplasm of colon, unspecified: C18.9

## 2023-03-19 ENCOUNTER — Ambulatory Visit: Payer: Medicare Other | Attending: Medical | Admitting: Medical

## 2023-03-19 ENCOUNTER — Encounter: Payer: Self-pay | Admitting: Medical

## 2023-03-19 VITALS — BP 140/80 | HR 77 | Ht 72.0 in | Wt 231.5 lb

## 2023-03-19 DIAGNOSIS — R079 Chest pain, unspecified: Secondary | ICD-10-CM | POA: Diagnosis not present

## 2023-03-19 DIAGNOSIS — I10 Essential (primary) hypertension: Secondary | ICD-10-CM

## 2023-03-19 DIAGNOSIS — E782 Mixed hyperlipidemia: Secondary | ICD-10-CM | POA: Diagnosis not present

## 2023-03-19 DIAGNOSIS — R55 Syncope and collapse: Secondary | ICD-10-CM | POA: Diagnosis not present

## 2023-03-19 DIAGNOSIS — Z79899 Other long term (current) drug therapy: Secondary | ICD-10-CM

## 2023-03-19 MED ORDER — DILTIAZEM HCL ER BEADS 360 MG PO CP24: 360.0000 mg | ORAL_CAPSULE | Freq: Every day | ORAL | 3 refills | Status: DC

## 2023-03-19 MED ORDER — AMLODIPINE BESYLATE 5 MG PO TABS: 5.0000 mg | ORAL_TABLET | Freq: Every day | ORAL | 3 refills | Status: DC

## 2023-03-19 NOTE — Progress Notes (Signed)
 Cardiology Office Note:    Date:  03/19/2023   ID:  Trevor Flores, [name] [birth year], MRN [id number]  PCP:  Antonio Baumgarten, MD  [facility] Cardiologist:  Belva Boyden, MD  [facility] Electrophysiologist:  None   Referring MD: Antonio Baumgarten, MD   Chief Complaint: 1 year follow-up  History of Present Illness:    Trevor Flores is a 63 y.o. male with a hx of hypertension, lightheadedness, obesity, near syncope, tremor, hyperlipidemia who presents for follow-up.  Heart monitor 2019 showed normal sinus rhythm, average heart rate of 55, first-degree AV block, rare ectopy.  Patient was last seen in July 2023 and was overall doing well from a cardiac perspective.  Today, the patient reports he is been overall doing well. No changes. He denies chest pain. He has some spasm in his left arm. It lasts about 2 seconds and it disappears. No shortness of breath. He chews tobacco sometimes. No lower leg edema, lightheadedness or dizziness. BP at home is 130/80s.   Past Medical History:  Diagnosis Date   Heart murmur    a. 12/2011 Echo: EF 60%, no rwma.   History of stress test    a. 04/2004 Ex MV: Ex time 10:30. Hypertensive response. No ischemia/infarct.   Hyperlipidemia    Hypertension    Palpitations    Parkinson disease (HCC)    Possible Lacunar infarction (HCC)    a. 12/2011 Head CT: CSF density lateral to R cerebral peduncle - ? prior lacunar infarct.   Pre-syncope    a. 06/2014 Monitor: Sinus rhythm. No significant arrhythmia.   Resting tremor    Vertigo     Past Surgical History:  Procedure Laterality Date   APPENDECTOMY     COLONOSCOPY WITH PROPOFOL  N/A 02/04/2023   Procedure: COLONOSCOPY WITH PROPOFOL ;  Surgeon: Toledo, Alphonsus Jeans, MD;  Location: [facility] ENDOSCOPY;  Service: Gastroenterology;  Laterality: N/A;   ESOPHAGOGASTRODUODENOSCOPY (EGD) WITH PROPOFOL  N/A 02/04/2023   Procedure: ESOPHAGOGASTRODUODENOSCOPY (EGD) WITH PROPOFOL ;  Surgeon: Toledo, Alphonsus Jeans, MD;   Location: [facility] ENDOSCOPY;  Service: Gastroenterology;  Laterality: N/A;   HEMORRHOID BANDING  ?   Dr Bradford Cadet    Current Medications: Current Meds  Medication Sig   albuterol  (VENTOLIN  HFA) 108 (90 Base) MCG/ACT inhaler Inhale 1-2 puffs into the lungs every 4 (four) hours as needed.   allopurinol (ZYLOPRIM) 100 MG tablet Take 100 mg by mouth daily.   amLODipine  (NORVASC ) 5 MG tablet Take 1 tablet (5 mg total) by mouth daily.   aspirin  EC 81 MG tablet Take 1 tablet (81 mg total) by mouth daily.   carbidopa -levodopa  (SINEMET  IR) 25-100 MG tablet Take 1 tablet by mouth 3 (three) times daily.   diltiazem  (TIAZAC ) 360 MG 24 hr capsule TAKE 1 CAPSULE(360 MG) BY MOUTH DAILY IN THE MORNING   doxazosin  (CARDURA ) 1 MG tablet TAKE 1 TABLET BY MOUTH DAILY AS NEEDED FOR PRESSURE GREATER THAN 150   fluticasone (FLONASE) 50 MCG/ACT nasal spray Place into both nostrils daily as needed for allergies or rhinitis.   losartan  (COZAAR ) 100 MG tablet TAKE 1 TABLET(100 MG) BY MOUTH DAILY IN THE MORNING   meclizine  (ANTIVERT ) 25 MG tablet Take 1 tablet (25 mg total) by mouth 3 (three) times daily as needed for dizziness.   meloxicam  (MOBIC ) 15 MG tablet TK ONE T PO QD as needed   metoprolol  succinate (TOPROL -XL) 25 MG 24 hr tablet TAKE 1 TABLET(25 MG) BY MOUTH EVERY EVENING WITH FOOD   oxyCODONE -acetaminophen  (PERCOCET) 7.5-325 MG tablet  Take 1 tablet by mouth every 6 (six) hours as needed.   pantoprazole  (PROTONIX ) 40 MG tablet Take 1 tablet (40 mg total) by mouth daily.   Plecanatide (TRULANCE) 3 MG TABS Take 3 mg by mouth as needed.   rosuvastatin  (CRESTOR ) 5 MG tablet Take 5 mg by mouth daily.   sildenafil (VIAGRA) 25 MG tablet Take 25 mg by mouth as needed.   Vitamin D, Ergocalciferol, (DRISDOL) 1.25 MG (50000 UNIT) CAPS capsule Take 50,000 Units by mouth once a week.     Allergies:   Other and Hydralazine    Social History   Socioeconomic History   Marital status: Married    Spouse name: Not on file    Number of children: Not on file   Years of education: Not on file   Highest education level: Not on file  Occupational History   Not on file  Tobacco Use   Smoking status: Never   Smokeless tobacco: Current    Types: Chew  Vaping Use   Vaping status: Never Used  Substance and Sexual Activity   Alcohol use: No   Drug use: No   Sexual activity: Not on file  Other Topics Concern   Not on file  Social History Narrative   Not on file   Social Drivers of Health   Financial Resource Strain: Low Risk  (11/05/2022)   Received from [facility]   Overall Financial Resource Strain (CARDIA)    Difficulty of Paying Living Expenses: Not hard at all  Food Insecurity: No Food Insecurity (11/05/2022)   Received from [facility]   Hunger Vital Sign    Worried About Running Out of Food in the Last Year: Never true    Ran Out of Food in the Last Year: Never true  Transportation Needs: No Transportation Needs (11/05/2022)   [facility]    In the past 12 months, has lack of transportation kept you from medical appointments or from getting medications?: No    Lack of Transportation (Non-Medical): No  Physical Activity: Not on file  Stress: Not on file  Social Connections: Unknown (08/09/2022)   Received from [facility]   Social Network    Social Network: Not on file     Family History: The patient's family history includes Diabetes in his brother. There is no history of Colon cancer.  ROS:   Please see the history of present illness.     All other systems reviewed and are negative.  EKGs/Labs/Other Studies Reviewed:    The following studies were reviewed today:  Heart monitor 01/2018 Normal sinus rhythm avg HR of 55 bpm.  Predominant underlying rhythm was Sinus Rhythm. First Degree AV Block was present. Isolated SVEs were rare (<1.0%), SVE Couplets were rare (<1.0%), and no SVE Triplets were  present. Isolated VEs were rare (<1.0%), and no VE Couplets or VE Triplets were present.   Signed, Juanda Noon, MD, Ph.D [facility]  EKG:  EKG is  ordered today.  The ekg ordered today demonstrates NSR 77bpm, LAD, tremor ar baseline, no significant ischemic changes  Recent Labs: 02/28/2023: ALT 19; BUN 18; Creatinine, Ser 1.37; Hemoglobin 11.6; Platelets 224; Potassium 3.4; Sodium 133  Recent Lipid Panel No results found for: "CHOL", "TRIG", "HDL", "CHOLHDL", "VLDL", "LDLCALC", "LDLDIRECT"  Physical Exam:    VS:  BP (!) 140/80 (BP Location: Left Arm, Patient Position: Sitting, Cuff Size: Large)   Pulse 77  Ht 6' (1.829 m)   Wt 231 lb 8 oz (105 kg)   SpO2 94%   BMI 31.40 kg/m     Wt Readings from Last 3 Encounters:  03/19/23 231 lb 8 oz (105 kg)  02/28/23 237 lb (107.5 kg)  10/13/22 238 lb (108 kg)     GEN:  Well nourished, well developed in no acute distress HEENT: Normal NECK: No JVD; No carotid bruits LYMPHATICS: No lymphadenopathy CARDIAC: RRR, no murmurs, rubs, gallops RESPIRATORY:  Clear to auscultation without rales, wheezing or rhonchi  ABDOMEN: Soft, non-tender, non-distended MUSCULOSKELETAL:  No edema; No deformity  SKIN: Warm and dry NEUROLOGIC:  Alert and oriented x 3 PSYCHIATRIC:  Normal affect   ASSESSMENT:    1. Essential hypertension   2. Chest pain, unspecified type   3. Near syncope   4. Hyperlipidemia, mixed   5. Medication management    PLAN:    In order of problems listed above:  HTN Arm pain/atypical chest pain In the ER BP 239/209 and he reported left arm pain, no chest pain. HS trop was negative. He is taking Losartan  100 mg daily, diltiazem  360 mg daily and Toprol  25 mg daily, and cardura  1mg  as needed for pressure>150 .  Blood pressure today is 140/80.  At home patient reports blood pressures of 130s over 80s.  I will start amlodipine  5 mg daily.  I will also check an echocardiogram.  Abnormal labs I will check a BMET to re-assess  potassium and kidney function.  Near syncope Patient reports no further orthostasis, presyncope or syncope.  HLD LDL 78, TG 45, total chol 161, HDL 58. Continue Crestor  5mg  daily.   Disposition: Follow up in 2 month(s) with MD/APP     Signed, Alexus Galka Rebekah Canada, PA-C  03/19/2023 9:34 AM    [facility]

## 2023-03-19 NOTE — Patient Instructions (Signed)
 Medication Instructions:   Please start Amlodipine  5 MG daily.  *If you need a refill on your cardiac medications before your next appointment, please call your pharmacy*   Lab Work: Your provider would like for you to have following labs drawn today BMP.    If you have labs (blood work) drawn today and your tests are completely normal, you will receive your results only by: MyChart Message (if you have MyChart) OR A paper copy in the mail If you have any lab test that is abnormal or we need to change your treatment, we will call you to review the results.   Testing/Procedures: Your physician has requested that you have an echocardiogram. Echocardiography is a painless test that uses sound waves to create images of your heart. It provides your doctor with information about the size and shape of your heart and how well your heart's chambers and valves are working.   You may receive an ultrasound enhancing agent through an IV if needed to better visualize your heart during the echo. This procedure takes approximately one hour.  There are no restrictions for this procedure.  This will take place at [address] [facility], [place] [address]  Please note: We ask at that you not bring children with you during ultrasound (echo/ vascular) testing. Due to room size and safety concerns, children are not allowed in the ultrasound rooms during exams. Our front office staff cannot provide observation of children in our lobby area while testing is being conducted. An adult accompanying a patient to their appointment will only be allowed in the ultrasound room at the discretion of the ultrasound technician under special circumstances. We apologize for any inconvenience.    Follow-Up: At [facility], you and your health needs are our priority.  As part of our continuing mission to provide you with exceptional heart care, we have created designated Provider Care Teams.   These Care Teams include your primary Cardiologist (physician) and Advanced Practice Providers (APPs -  Physician Assistants and Nurse Practitioners) who all work together to provide you with the care you need, when you need it.  We recommend signing up for the patient portal called "MyChart".  Sign up information is provided on this After Visit Summary.  MyChart is used to connect with patients for Virtual Visits (Telemedicine).  Patients are able to view lab/test results, encounter notes, upcoming appointments, etc.  Non-urgent messages can be sent to your provider as well.   To learn more about what you can do with MyChart, go to [email or web address].    Your next appointment:   2 month(s)  Provider:   You may see Timothy Gollan, MD or one of the following Advanced Practice Providers on your designated Care Team:   Laneta Pintos, NP Varney Gentleman, PA-C Cadence Gennaro Khat, PA-C Ronald Cockayne, NP Morey Ar, NP

## 2023-03-20 LAB — BASIC METABOLIC PANEL
BUN/Creatinine Ratio: 12 (ref 10–24)
BUN: 16 mg/dL (ref 8–27)
CO2: 22 mmol/L (ref 20–29)
Calcium: 9.2 mg/dL (ref 8.6–10.2)
Chloride: 99 mmol/L (ref 96–106)
Creatinine, Ser: 1.39 mg/dL — ABNORMAL HIGH (ref 0.76–1.27)
Glucose: 98 mg/dL (ref 70–99)
Potassium: 3.5 mmol/L (ref 3.5–5.2)
Sodium: 139 mmol/L (ref 134–144)
eGFR: 58 mL/min/{1.73_m2} — ABNORMAL LOW (ref >59)

## 2023-04-03 ENCOUNTER — Ambulatory Visit: Payer: Medicare Other | Attending: Medical

## 2023-04-03 DIAGNOSIS — R079 Chest pain, unspecified: Secondary | ICD-10-CM | POA: Diagnosis not present

## 2023-04-03 DIAGNOSIS — I10 Essential (primary) hypertension: Secondary | ICD-10-CM

## 2023-04-04 LAB — ECHOCARDIOGRAM COMPLETE
AV Mean grad: 5 mm[Hg]
AV Peak grad: 9.1 mm[Hg]
Ao pk vel: 1.51 m/s
Area-P 1/2: 2.83 cm2
S' Lateral: 2.9 cm

## 2023-04-13 ENCOUNTER — Other Ambulatory Visit: Payer: Self-pay | Admitting: Cardiovascular Disease

## 2023-04-19 ENCOUNTER — Other Ambulatory Visit: Payer: Self-pay | Admitting: Cardiovascular Disease

## 2023-04-19 DIAGNOSIS — I10 Essential (primary) hypertension: Secondary | ICD-10-CM

## 2023-05-19 NOTE — Progress Notes (Unsigned)
 Cardiology Office Note  Date:  05/20/2023   ID:  Trevor Flores, [name] [birth year], MRN [id number]  PCP:  Marcello Fennel,   Chief Complaint  Patient presents with   2 month follow up     Patient c/o shortness of breath with little to no exertion extreme weight loss & loss of appetite. Patient has an upcoming colonoscopy.     HPI:  Trevor Flores is a very pleasant 63 year old gentleman with a history of hypertension,  lightheadedness,  obesity,  near syncope Tremor hyperlipidemia He presents for follow-up of his hypertension, near syncope  LOV with myself 7/23 Seen by one of our providers 1/25 Presents with his wife on today's visit Echocardiogram January 2025 EF 60 to 65% normal RV size and function No significant valvular heart disease  Weight in July 2023 was 267 Weight today 212 pounds Losing weight through dietary changes  Reports having palpitations Jan 2025, now gone away Scheduled for colonoscopy, has not had one to date  Chronic tremor Has not been working out as he used to, losing muscle mass Conditioning is poor  Used to walk daily at [employer], no longer  Reports he has not been taking amlodipine  Labs reviewed Total chol 125, LDL 72 CR 1.4 A1C 5.4  EKG personally reviewed by myself on todays visit EKG Interpretation Date/Time:  Tuesday May 20 2023 14:26:54 EDT Ventricular Rate:  83 PR Interval:  192 QRS Duration:  78 QT Interval:  452 QTC Calculation: 531 R Axis:   -9  Text Interpretation: Normal sinus rhythm When compared with ECG of 19-Mar-2023 09:05, Nonspecific T wave abnormality, improved in Inferior leads Confirmed by Julien Nordmann [phone]) on 05/20/2023 2:31:49 PM     PMH:   has a past medical history of Heart murmur, History of stress test, Hyperlipidemia, Hypertension, Palpitations, Parkinson disease (HCC), Possible Lacunar infarction (HCC), Pre-syncope, Resting tremor, and Vertigo.  PSH:    Past Surgical History:  Procedure  Laterality Date   APPENDECTOMY     COLONOSCOPY WITH PROPOFOL N/A 02/04/2023   Procedure: COLONOSCOPY WITH PROPOFOL;  Surgeon: Toledo, Boykin Nearing, MD;  Location: [facility] ENDOSCOPY;  Service: Gastroenterology;  Laterality: N/A;   ESOPHAGOGASTRODUODENOSCOPY (EGD) WITH PROPOFOL N/A 02/04/2023   Procedure: ESOPHAGOGASTRODUODENOSCOPY (EGD) WITH PROPOFOL;  Surgeon: Toledo, Boykin Nearing, MD;  Location: [facility] ENDOSCOPY;  Service: Gastroenterology;  Laterality: N/A;   HEMORRHOID BANDING  ?   Dr Maryruth Bun    Current Outpatient Medications  Medication Sig Dispense Refill   albuterol (VENTOLIN HFA) 108 (90 Base) MCG/ACT inhaler Inhale 1-2 puffs into the lungs every 4 (four) hours as needed.     allopurinol (ZYLOPRIM) 100 MG tablet Take 100 mg by mouth daily.     amLODipine (NORVASC) 5 MG tablet Take 1 tablet (5 mg total) by mouth daily. 180 tablet 3   aspirin EC 81 MG tablet Take 1 tablet (81 mg total) by mouth daily. 90 tablet 3   carbidopa-levodopa (SINEMET IR) 25-100 MG tablet Take 1 tablet by mouth 3 (three) times daily.     diltiazem (TIAZAC) 360 MG 24 hr capsule Take 1 capsule (360 mg total) by mouth daily. 90 capsule 3   Docusate Sodium (DSS) 100 MG CAPS Take 1 capsule by mouth 2 (two) times daily.     doxazosin (CARDURA) 1 MG tablet TAKE 1 TABLET BY MOUTH DAILY AS NEEDED FOR PRESSURE GREATER THAN 150 90 tablet 0   fluticasone (FLONASE) 50 MCG/ACT nasal spray Place into both nostrils daily as needed for  allergies or rhinitis.     losartan (COZAAR) 100 MG tablet TAKE 1 TABLET(100 MG) BY MOUTH DAILY IN THE MORNING 90 tablet 2   meclizine (ANTIVERT) 25 MG tablet Take 1 tablet (25 mg total) by mouth 3 (three) times daily as needed for dizziness. 30 tablet 1   meloxicam (MOBIC) 15 MG tablet TK ONE T PO QD as needed 90 tablet 0   metoprolol succinate (TOPROL-XL) 25 MG 24 hr tablet TAKE 1 TABLET(25 MG) BY MOUTH EVERY EVENING WITH FOOD 90 tablet 2   oxyCODONE-acetaminophen (PERCOCET) 7.5-325 MG tablet Take 1 tablet by  mouth every 6 (six) hours as needed.     pantoprazole (PROTONIX) 40 MG tablet Take 1 tablet (40 mg total) by mouth daily. 30 tablet 0   Plecanatide (TRULANCE) 3 MG TABS Take 3 mg by mouth as needed.     rosuvastatin (CRESTOR) 5 MG tablet Take 5 mg by mouth daily.     sildenafil (VIAGRA) 25 MG tablet Take 25 mg by mouth as needed.     Vitamin D, Ergocalciferol, (DRISDOL) 1.25 MG (50000 UNIT) CAPS capsule Take 50,000 Units by mouth once a week.     LINZESS 72 MCG capsule Take 72 mcg by mouth every morning. (Patient not taking: Reported on 05/20/2023)     loratadine (CLARITIN) 10 MG tablet Take 10 mg by mouth daily as needed for allergies. (Patient not taking: Reported on 05/20/2023)     methocarbamol (ROBAXIN) 500 MG tablet Take 1 tablet (500 mg total) by mouth at bedtime as needed for muscle spasms. (Patient not taking: Reported on 05/20/2023) 30 tablet 1   sucralfate (CARAFATE) 1 g tablet Take 1 tablet (1 g total) by mouth 4 (four) times daily. (Patient not taking: Reported on 05/20/2023) 120 tablet 1   No current facility-administered medications for this visit.    Allergies:   Other and Hydralazine   Social History:  The patient  reports that he has never smoked. His smokeless tobacco use includes chew. He reports that he does not drink alcohol and does not use drugs.   Family History:   family history includes Diabetes in his brother.   Review of Systems: Review of Systems  Constitutional: Negative.   HENT: Negative.    Respiratory: Negative.    Cardiovascular: Negative.   Gastrointestinal: Negative.   Musculoskeletal:  Positive for joint pain.  Neurological:  Positive for tremors.  Psychiatric/Behavioral: Negative.    All other systems reviewed and are negative.   PHYSICAL EXAM: VS:  BP 120/80 (BP Location: Left Arm, Patient Position: Sitting, Cuff Size: Normal)   Pulse 83   Ht 6' (1.829 m)   Wt 212 lb (96.2 kg)   SpO2 98%   BMI 28.75 kg/m  , BMI Body mass index is 28.75  kg/m. Constitutional:  oriented to person, place, and time. No distress.  HENT:  Head: Grossly normal Eyes:  no discharge. No scleral icterus.  Neck: No JVD, no carotid bruits  Cardiovascular: Regular rate and rhythm, no murmurs appreciated Pulmonary/Chest: Clear to auscultation bilaterally, no wheezes or rails Abdominal: Soft.  no distension.  no tenderness.  Musculoskeletal: Normal range of motion Neurological:  normal muscle tone. Coordination normal. No atrophy Skin: Skin warm and dry Psychiatric: normal affect, pleasant  Recent Labs: 02/28/2023: ALT 19; Hemoglobin 11.6; Platelets 224 03/19/2023: BUN 16; Creatinine, Ser 1.39; Potassium 3.5; Sodium 139    Lipid Panel No results found for: "CHOL", "HDL", "LDLCALC", "TRIG"   I pain Wt Readings from Last  3 Encounters:  05/20/23 212 lb (96.2 kg)  03/19/23 231 lb 8 oz (105 kg)  02/28/23 237 lb (107.5 kg)     ASSESSMENT AND PLAN:  Preop cardiovascular evaluation for colonoscopy Acceptable risk for colonoscopy Normal echocardiogram, no further cardiac testing needed  Essential hypertension He is not taking amlodipine, recommend stop amlodipine Continue metoprolol, diltiazem ER  on cardura PRN 1 mg pressure >150  Morbid obesity (HCC) Deconditioned, walks with a cane/walker, no recent falls Weight down 267 down to 212 through dietary changes  Palpitations, ectopy Rare symptoms Takes only half dose metoprolol, recommend he take extra half dose of metoprolol as needed for breakthrough palpitations  Near syncope Denies any recent near-syncope spells  Tremor/Parkinson's Followed by neurology, recommended regular exercise program Losing muscle mass     Signed, Dossie Arbour, M.D., Ph.D. 05/20/2023  [facility] [place], [place] [phone]

## 2023-05-20 ENCOUNTER — Ambulatory Visit: Payer: Medicare Other | Attending: Cardiovascular Disease | Admitting: Cardiovascular Disease

## 2023-05-20 VITALS — BP 120/80 | HR 83 | Ht 72.0 in | Wt 212.0 lb

## 2023-05-20 DIAGNOSIS — I10 Essential (primary) hypertension: Secondary | ICD-10-CM | POA: Diagnosis not present

## 2023-05-20 DIAGNOSIS — R55 Syncope and collapse: Secondary | ICD-10-CM

## 2023-05-20 DIAGNOSIS — R079 Chest pain, unspecified: Secondary | ICD-10-CM

## 2023-05-20 DIAGNOSIS — E782 Mixed hyperlipidemia: Secondary | ICD-10-CM

## 2023-05-20 DIAGNOSIS — R002 Palpitations: Secondary | ICD-10-CM

## 2023-05-20 NOTE — Patient Instructions (Addendum)
 Research [employer] on [employer] daily  Medication Instructions:  Stop amlodipine  If you need a refill on your cardiac medications before your next appointment, please call your pharmacy.   Lab work: No new labs needed  Testing/Procedures: No new testing needed  Follow-Up: At [facility], you and your health needs are our priority.  As part of our continuing mission to provide you with exceptional heart care, we have created designated Provider Care Teams.  These Care Teams include your primary Cardiologist (physician) and Advanced Practice Providers (APPs -  Physician Assistants and Nurse Practitioners) who all work together to provide you with the care you need, when you need it.  You will need a follow up appointment in 12 months  Providers on your designated Care Team:   Nicolasa Ducking, NP Eula Listen, PA-C Cadence Fransico Michael, [place]  COVID-19 Vaccine Information can be found at: [email or web address] For questions related to vaccine distribution or appointments, please email [email or web address] or call [phone].

## 2023-05-29 ENCOUNTER — Encounter: Payer: Self-pay | Admitting: Internal Medicine

## 2023-06-03 ENCOUNTER — Encounter: Payer: Self-pay | Admitting: Internal Medicine

## 2023-06-10 ENCOUNTER — Other Ambulatory Visit: Payer: Self-pay

## 2023-06-10 ENCOUNTER — Encounter
Admission: RE | Disposition: A | Discharge status: Home / Self Care | Payer: Self-pay | Source: Home / Self Care | Attending: Internal Medicine

## 2023-06-10 ENCOUNTER — Encounter: Payer: Self-pay | Admitting: Internal Medicine

## 2023-06-10 ENCOUNTER — Ambulatory Visit
Admission: RE | Admit: 2023-06-10 | Discharge: 2023-06-10 | Disposition: A | Discharge status: Home / Self Care | Payer: Medicare Other | Attending: Internal Medicine | Admitting: Internal Medicine

## 2023-06-10 ENCOUNTER — Ambulatory Visit: Admitting: Anesthesiology

## 2023-06-10 DIAGNOSIS — K640 First degree hemorrhoids: Secondary | ICD-10-CM | POA: Insufficient documentation

## 2023-06-10 DIAGNOSIS — G20A1 Parkinson's disease without dyskinesia, without mention of fluctuations: Secondary | ICD-10-CM | POA: Insufficient documentation

## 2023-06-10 DIAGNOSIS — C19 Malignant neoplasm of rectosigmoid junction: Secondary | ICD-10-CM | POA: Diagnosis not present

## 2023-06-10 DIAGNOSIS — I69351 Hemiplegia and hemiparesis following cerebral infarction affecting right dominant side: Secondary | ICD-10-CM | POA: Insufficient documentation

## 2023-06-10 DIAGNOSIS — K5900 Constipation, unspecified: Secondary | ICD-10-CM | POA: Insufficient documentation

## 2023-06-10 DIAGNOSIS — K5669 Other partial intestinal obstruction: Secondary | ICD-10-CM | POA: Insufficient documentation

## 2023-06-10 DIAGNOSIS — R197 Diarrhea, unspecified: Secondary | ICD-10-CM | POA: Insufficient documentation

## 2023-06-10 DIAGNOSIS — K219 Gastro-esophageal reflux disease without esophagitis: Secondary | ICD-10-CM | POA: Diagnosis not present

## 2023-06-10 DIAGNOSIS — D500 Iron deficiency anemia secondary to blood loss (chronic): Secondary | ICD-10-CM | POA: Insufficient documentation

## 2023-06-10 DIAGNOSIS — I10 Essential (primary) hypertension: Secondary | ICD-10-CM | POA: Insufficient documentation

## 2023-06-10 DIAGNOSIS — D122 Benign neoplasm of ascending colon: Secondary | ICD-10-CM | POA: Insufficient documentation

## 2023-06-10 HISTORY — PX: ESOPHAGOGASTRODUODENOSCOPY (EGD) WITH PROPOFOL: SHX5813

## 2023-06-10 HISTORY — DX: Vascular parkinsonism: G21.4

## 2023-06-10 HISTORY — DX: Personal history of other diseases of the musculoskeletal system and connective tissue: Z87.39

## 2023-06-10 HISTORY — DX: Gastro-esophageal reflux disease without esophagitis: K21.9

## 2023-06-10 HISTORY — DX: Polyosteoarthritis, unspecified: M15.9

## 2023-06-10 HISTORY — PX: POLYPECTOMY: SHX149

## 2023-06-10 HISTORY — PX: COLONOSCOPY WITH PROPOFOL: SHX5780

## 2023-06-10 HISTORY — DX: Cerebral infarction, unspecified: I63.9

## 2023-06-10 HISTORY — DX: Hemiplegia and hemiparesis following cerebral infarction affecting right dominant side: I69.351

## 2023-06-10 SURGERY — COLONOSCOPY WITH PROPOFOL
Anesthesia: General

## 2023-06-10 MED ORDER — GLYCOPYRROLATE 0.2 MG/ML IJ SOLN
INTRAMUSCULAR | Status: DC | PRN
Start: 2023-06-10 — End: 2023-06-10
  Administered 2023-06-10: .2 mg via INTRAVENOUS

## 2023-06-10 MED ORDER — DEXMEDETOMIDINE HCL IN NACL 80 MCG/20ML IV SOLN
INTRAVENOUS | Status: DC | PRN
  Administered 2023-06-10: 20 ug via INTRAVENOUS

## 2023-06-10 MED ORDER — LIDOCAINE HCL (CARDIAC) PF 100 MG/5ML IV SOSY
PREFILLED_SYRINGE | INTRAVENOUS | Status: DC | PRN
  Administered 2023-06-10: 80 mg via INTRAVENOUS

## 2023-06-10 MED ORDER — LIDOCAINE HCL (PF) 2 % IJ SOLN
INTRAMUSCULAR | Status: AC
  Filled 2023-06-10: qty 10

## 2023-06-10 MED ORDER — SODIUM CHLORIDE 0.9 % IV SOLN: INTRAVENOUS | Status: DC

## 2023-06-10 MED ORDER — PROPOFOL 10 MG/ML IV BOLUS
INTRAVENOUS | Status: DC | PRN
  Administered 2023-06-10: 10 mg via INTRAVENOUS
  Administered 2023-06-10: 40 mg via INTRAVENOUS
  Administered 2023-06-10: 50 mg via INTRAVENOUS

## 2023-06-10 MED ORDER — SPOT INK MARKER SYRINGE KIT
PACK | SUBMUCOSAL | Status: DC | PRN
  Administered 2023-06-10: 8 mL via SUBMUCOSAL

## 2023-06-10 MED ORDER — PROPOFOL 500 MG/50ML IV EMUL
INTRAVENOUS | Status: DC | PRN
  Administered 2023-06-10: 75 ug/kg/min via INTRAVENOUS

## 2023-06-10 NOTE — Op Note (Signed)
 [facility]enterology Patient Name: Trevor Flores Procedure Date: 06/10/2023 10:57 AM MRN: [id number] Account #: [id number] Date of Birth: 03-24-1960 Admit Type: Outpatient Age: 63 Room: [facility] ENDO ROOM 1 Gender: Male Note Status: Finalized Instrument Name: Nelda Marseille [id number] Procedure:             Colonoscopy Indications:           Positive Cologuard test, Iron deficiency anemia                         secondary to chronic blood loss, Change in bowel habits Providers:             Boykin Nearing. Momin Misko MD, MD Medicines:             Propofol per Anesthesia Complications:         No immediate complications. Estimated blood loss:                         Minimal. Procedure:             Pre-Anesthesia Assessment:                        - The risks and benefits of the procedure and the                         sedation options and risks were discussed with the                         patient. All questions were answered and informed                         consent was obtained.                        - Patient identification and proposed procedure were                         verified prior to the procedure by the nurse. The                         procedure was verified in the procedure room.                        - ASA Grade Assessment: III - A patient with severe                         systemic disease.                        - After reviewing the risks and benefits, the patient                         was deemed in satisfactory condition to undergo the                         procedure.                        After obtaining informed consent, the colonoscope was  passed under direct vision. Throughout the procedure,                         the patient's blood pressure, pulse, and oxygen                         saturations were monitored continuously. The                         Colonoscope was introduced through the anus and                          advanced to the the cecum, identified by appendiceal                         orifice and ileocecal valve. The colonoscopy was                         technically difficult and complex due to restricted                         mobility of the colon and significant looping.                         Successful completion of the procedure was aided by                         applying abdominal pressure. The patient tolerated the                         procedure well. The quality of the bowel preparation                         was adequate. The ileocecal valve, appendiceal                         orifice, and rectum were photographed. Findings:      The perianal and digital rectal examinations were normal. Pertinent       negatives include normal sphincter tone and no palpable rectal lesions.      Non-bleeding internal hemorrhoids were found during retroflexion. The       hemorrhoids were Grade I (internal hemorrhoids that do not prolapse).      A 15 mm polyp was found in the ascending colon. The polyp was       semi-pedunculated. The polyp was removed with a piecemeal technique       using a hot snare. Resection and retrieval were complete. To prevent       bleeding after the polypectomy, one hemostatic clip was successfully       placed (MR conditional). Clip manufacturer: [employer]. There was       no bleeding during, or at the end, of the procedure.      A fungating, infiltrative and ulcerated partially obstructing large mass       was found in the mid descending colon and in the distal descending       colon. The mass was circumferential. The mass measured five cm in       length. No bleeding was present. Biopsies were taken with a cold  forceps       for histology. Area was tattooed with an injection of 8 cc (4cc proximal       and 4 cc distal to the lesion). Pattern was 1cc in each quad. Estimated       blood loss was minimal.      The exam was otherwise without  abnormality. Impression:            - Non-bleeding internal hemorrhoids.                        - One 15 mm polyp in the ascending colon, removed                         piecemeal using a hot snare. Resected and retrieved.                         Clip (MR conditional) was placed. Clip manufacturer:                         [employer].                        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 Biopsied. Tattooed.                        - The examination was otherwise normal. Recommendation:        - Patient has a contact number available for                         emergencies. The signs and symptoms of potential                         delayed complications were discussed with the patient.                         Return to normal activities tomorrow. Written                         discharge instructions were provided to the patient.                        - Resume previous diet.                        - Continue present medications.                        - If the pathology report is malignant, then refer to                         an oncologist at appointment to be scheduled.                        -  If the pathology report is malignant, then refer to                         a surgeon at appointment to be scheduled.                        - Return to my office at appointment to be scheduled.                        - Follow up with Tawni Pummel, PA-C at [facility]. [phone].                        - The findings and recommendations were discussed with                         the patient and their spouse. Procedure Code(s):     --- Professional ---                        [phone], Colonoscopy, flexible; with removal of                         tumor(s), polyp(s), or other lesion(s) by snare                          technique                        45380, 59, Colonoscopy, flexible; with biopsy, single                         or multiple                        45381, Colonoscopy, flexible; with directed submucosal                         injection(s), any substance Diagnosis Code(s):     --- Professional ---                        R19.4, Change in bowel habit                        D50.0, Iron deficiency anemia secondary to blood loss                         (chronic)                        R19.5, Other fecal abnormalities                        K64.0, First degree hemorrhoids                        K56.690, Other partial intestinal obstruction                        D49.0, Neoplasm  of unspecified behavior of digestive                         system                        D12.2, Benign neoplasm of ascending colon CPT copyright 2022 American Medical Association. All rights reserved. The codes documented in this report are preliminary and upon coder review may  be revised to meet current compliance requirements. Stanton Kidney MD, MD 06/10/2023 11:48:23 AM This report has been signed electronically. Number of Addenda: 0 Note Initiated On: 06/10/2023 10:57 AM Scope Withdrawal Time: 0 hours 11 minutes 51 seconds  Total Procedure Duration: 0 hours 23 minutes 25 seconds  Estimated Blood Loss:  Estimated blood loss was minimal.      [facility]

## 2023-06-10 NOTE — Interval H&P Note (Signed)
 History and Physical Interval Note:  06/10/2023 10:53 AM  Trevor Flores  has presented today for surgery, with the diagnosis of D64.9 (ICD-10-CM) - Chronic anemia, unspecified R19.5 (ICD-10-CM) - Positive colorectal cancer screening using Cologuard test K59.09 (ICD-10-CM) - Chronic constipation K21.9 (ICD-10-CM) - Gastroesophageal reflux disease, unspecified whether esophagitis present.  The various methods of treatment have been discussed with the patient and family. After consideration of risks, benefits and other options for treatment, the patient has consented to  Procedure(s): COLONOSCOPY WITH PROPOFOL (N/A) ESOPHAGOGASTRODUODENOSCOPY (EGD) WITH PROPOFOL (N/A) as a surgical intervention.  The patient's history has been reviewed, patient examined, no change in status, stable for surgery.  I have reviewed the patient's chart and labs.  Questions were answered to the patient's satisfaction.     [place], [place]

## 2023-06-10 NOTE — Op Note (Signed)
 [facility] Gastroenterology Patient Name: Trevor Flores Procedure Date: 06/10/2023 10:58 AM MRN: [id number] Account #: [id number] Date of Birth: 05-09-1960 Admit Type: Outpatient Age: 63 Room: [facility] ENDO ROOM 1 Gender: Male Note Status: Finalized Instrument Name: Upper Endoscope [id number] Procedure:             Upper GI endoscopy Indications:           Iron deficiency anemia secondary to chronic blood                         loss, Gastro-esophageal reflux disease Providers:             Boykin Nearing. Briar Sword MD, MD Medicines:             Propofol per Anesthesia Complications:         No immediate complications. Procedure:             Pre-Anesthesia Assessment:                        - The risks and benefits of the procedure and the                         sedation options and risks were discussed with the                         patient. All questions were answered and informed                         consent was obtained.                        - Patient identification and proposed procedure were                         verified prior to the procedure by the nurse. The                         procedure was verified in the procedure room.                        - ASA Grade Assessment: III - A patient with severe                         systemic disease.                        - After reviewing the risks and benefits, the patient                         was deemed in satisfactory condition to undergo the                         procedure.                        After obtaining informed consent, the endoscope was                         passed under direct vision. Throughout the procedure,  the patient's blood pressure, pulse, and oxygen                         saturations were monitored continuously. The Endoscope                         was introduced through the mouth, and advanced to the                         third part of duodenum. The upper  GI endoscopy was                         accomplished without difficulty. The patient tolerated                         the procedure well. Findings:      The esophagus was normal.      The stomach was normal.      The examined duodenum was normal. Impression:            - Normal esophagus.                        - Normal stomach.                        - Normal examined duodenum.                        - No specimens collected. Recommendation:        - Proceed with colonoscopy Procedure Code(s):     --- Professional ---                        [phone], Esophagogastroduodenoscopy, flexible,                         transoral; diagnostic, including collection of                         specimen(s) by brushing or washing, when performed                         (separate procedure) Diagnosis Code(s):     --- Professional ---                        K21.9, Gastro-esophageal reflux disease without                         esophagitis                        D50.0, Iron deficiency anemia secondary to blood loss                         (chronic) CPT copyright 2022 American Medical Association. All rights reserved. The codes documented in this report are preliminary and upon coder review may  be revised to meet current compliance requirements. Stanton Kidney MD, MD 06/10/2023 11:16:04 AM This report has been signed electronically. Number of Addenda: 0 Note Initiated On: 06/10/2023 10:58 AM Estimated Blood Loss:  Estimated blood loss: none.  [facility]

## 2023-06-10 NOTE — H&P (Signed)
 Outpatient short stay form Pre-procedure 06/10/2023 10:51 AM Trevor Flores, M.D.  Primary Physician: Trevor Flores, M.D.  Reason for visit:  GERD, hx chronic anemia, chronic constipation, positive Cologuard stool test.  History of present illness:  63 y/o male with chronic anemia presents for EGD and colonoscopy for the above diagnoses. Trevor Flores reports continuing to have issues with constipation. He reports that the Linzess seem to help initially but has stopped effectiveness. He currently feels Colace helps him the most. He takes Colace every 3 to 4 days and usually takes 3-4 Colace pills at a time. He feels with the Colace he will have a bowel movement about 12 hours after taking it. He denies any issues with alternating diarrhea. He does get some occasional rectal bleeding with straining at times. He can get lower abdominal pain when missing several days without a bowel movement that improves after defecation.  He previously was having some issues with intermittent epigastric pain and GERD. He feels that this is improved since his last visit and is no longer needing the Carafate as needed. He denies any nausea vomiting or dysphagia.  Wife accompanies him to the history.  He denies alcohol tobacco or NSAIDs.  + Weight loss -discussed with patient and wife. Reports he has been losing weight for few years. He does walk daily but wife feels he is eating less than he previously did.    Current Facility-Administered Medications:    0.9 %  sodium chloride infusion, , Intravenous, Continuous, [place], Boykin Nearing, MD, Last Rate: 20 mL/hr at 06/10/23 1037, New Bag at 06/10/23 1037  Medications Prior to Admission  Medication Sig Dispense Refill Last Dose/Taking   allopurinol (ZYLOPRIM) 100 MG tablet Take 100 mg by mouth daily.   06/08/2023   aspirin EC 81 MG tablet Take 1 tablet (81 mg total) by mouth daily. 90 tablet 3 06/10/2023 at  5:30 AM   carbidopa-levodopa (SINEMET IR) 25-100 MG tablet  Take 1 tablet by mouth 3 (three) times daily.   06/09/2023   diltiazem (TIAZAC) 360 MG 24 hr capsule Take 1 capsule (360 mg total) by mouth daily. 90 capsule 3 06/10/2023 at  5:30 AM   doxazosin (CARDURA) 1 MG tablet TAKE 1 TABLET BY MOUTH DAILY AS NEEDED FOR PRESSURE GREATER THAN 150 90 tablet 0 Past Week   losartan (COZAAR) 100 MG tablet TAKE 1 TABLET(100 MG) BY MOUTH DAILY IN THE MORNING 90 tablet 2 06/10/2023 at  5:30 AM   meloxicam (MOBIC) 15 MG tablet TK ONE T PO QD as needed 90 tablet 0 Past Week   metoprolol succinate (TOPROL-XL) 25 MG 24 hr tablet TAKE 1 TABLET(25 MG) BY MOUTH EVERY EVENING WITH FOOD 90 tablet 2 06/10/2023 at  5:30 AM   rosuvastatin (CRESTOR) 5 MG tablet Take 5 mg by mouth daily.   06/08/2023   Vitamin D, Ergocalciferol, (DRISDOL) 1.25 MG (50000 UNIT) CAPS capsule Take 50,000 Units by mouth once a week.   Past Week   albuterol (VENTOLIN HFA) 108 (90 Base) MCG/ACT inhaler Inhale 1-2 puffs into the lungs every 4 (four) hours as needed.      Docusate Sodium (DSS) 100 MG CAPS Take 1 capsule by mouth 2 (two) times daily.      fluticasone (FLONASE) 50 MCG/ACT nasal spray Place into both nostrils daily as needed for allergies or rhinitis.      LINZESS 72 MCG capsule Take 72 mcg by mouth every morning. (Patient not taking: Reported on 05/20/2023)  loratadine (CLARITIN) 10 MG tablet Take 10 mg by mouth daily as needed for allergies. (Patient not taking: Reported on 05/20/2023)      meclizine (ANTIVERT) 25 MG tablet Take 1 tablet (25 mg total) by mouth 3 (three) times daily as needed for dizziness. 30 tablet 1    methocarbamol (ROBAXIN) 500 MG tablet Take 1 tablet (500 mg total) by mouth at bedtime as needed for muscle spasms. (Patient not taking: Reported on 05/20/2023) 30 tablet 1    oxyCODONE-acetaminophen (PERCOCET) 7.5-325 MG tablet Take 1 tablet by mouth every 6 (six) hours as needed.      pantoprazole (PROTONIX) 40 MG tablet Take 1 tablet (40 mg total) by mouth daily. 30 tablet 0     Plecanatide (TRULANCE) 3 MG TABS Take 3 mg by mouth as needed.      sildenafil (VIAGRA) 25 MG tablet Take 25 mg by mouth as needed.      sucralfate (CARAFATE) 1 g tablet Take 1 tablet (1 g total) by mouth 4 (four) times daily. (Patient not taking: Reported on 05/20/2023) 120 tablet 1      Allergies  Allergen Reactions   Other    Hydralazine Palpitations     Past Medical History:  Diagnosis Date   Generalized osteoarthritis of multiple sites    GERD (gastroesophageal reflux disease)    Heart murmur    a. 12/2011 Echo: EF 60%, no rwma.   Hemiparesis affecting right side as late effect of cerebrovascular accident (CVA) (HCC)    History of stress test    a. 04/2004 Ex MV: Ex time 10:30. Hypertensive response. No ischemia/infarct.   Hyperlipidemia    Hypertension    Palpitations    Parkinson disease (HCC)    Personal history of gout    Possible Lacunar infarction (HCC)    a. 12/2011 Head CT: CSF density lateral to R cerebral peduncle - ? prior lacunar infarct.   Pre-syncope    a. 06/2014 Monitor: Sinus rhythm. No significant arrhythmia.   Resting tremor    Stroke [facility])    Vascular parkinsonism (HCC)    Vertigo     Review of systems:  Otherwise negative.    Physical Exam  Gen: Alert, oriented. Appears stated age.  HEENT: [place]/AT. PERRLA. Lungs: CTA, no wheezes. CV: RR nl S1, S2. Abd: soft, benign, no masses. BS+ Ext: No edema. Pulses 2+    Planned procedures: Proceed with EGD and colonoscopy. The patient understands the nature of the planned procedure, indications, risks, alternatives and potential complications including but not limited to bleeding, infection, perforation, damage to internal organs and possible oversedation/side effects from anesthesia. The patient agrees and gives consent to proceed.  Please refer to procedure notes for findings, recommendations and patient disposition/instructions.     Diahann Guajardo K. Norma Flores, M.D. Gastroenterology 06/10/2023  10:51  AM

## 2023-06-10 NOTE — Anesthesia Postprocedure Evaluation (Signed)
 Anesthesia Post Note  Patient: Trevor Flores  Procedure(s) Performed: COLONOSCOPY WITH PROPOFOL ESOPHAGOGASTRODUODENOSCOPY (EGD) WITH PROPOFOL POLYPECTOMY, INTESTINE  Patient location during evaluation: Endoscopy Anesthesia Type: General Level of consciousness: awake and alert Pain management: pain level controlled Vital Signs Assessment: post-procedure vital signs reviewed and stable Respiratory status: spontaneous breathing, nonlabored ventilation, respiratory function stable and patient connected to nasal cannula oxygen Cardiovascular status: blood pressure returned to baseline and stable Postop Assessment: no apparent nausea or vomiting Anesthetic complications: no   No notable events documented.   Last Vitals:  Vitals:   06/10/23 1202 06/10/23 1206  BP: 112/71 108/70  Pulse: 80 77  Resp: (!) 23 19  Temp:    SpO2: 100% 100%    Last Pain:  Vitals:   06/10/23 1206  TempSrc:   PainSc: 0-No pain                 Cleda Mccreedy Alexea Blase

## 2023-06-10 NOTE — Anesthesia Preprocedure Evaluation (Signed)
 Anesthesia Evaluation  Patient identified by MRN, date of birth, ID band Patient awake    Reviewed: Allergy & Precautions, NPO status , Patient's Chart, lab work & pertinent test results  History of Anesthesia Complications Negative for: history of anesthetic complications  Airway Mallampati: III  TM Distance: >3 FB Neck ROM: full    Dental  (+) Chipped   Pulmonary neg pulmonary ROS, neg shortness of breath   Pulmonary exam normal        Cardiovascular Exercise Tolerance: Good hypertension, (-) angina Normal cardiovascular exam     Neuro/Psych CVA, Residual Symptoms  negative psych ROS   GI/Hepatic Neg liver ROS,GERD  Controlled,,  Endo/Other  negative endocrine ROS    Renal/GU negative Renal ROS  negative genitourinary   Musculoskeletal   Abdominal   Peds  Hematology negative hematology ROS (+)   Anesthesia Other Findings Past Medical History: No date: Generalized osteoarthritis of multiple sites No date: GERD (gastroesophageal reflux disease) No date: Heart murmur     Comment:  a. 12/2011 Echo: EF 60%, no rwma. No date: Hemiparesis affecting right side as late effect of  cerebrovascular accident (CVA) (HCC) No date: History of stress test     Comment:  a. 04/2004 Ex MV: Ex time 10:30. Hypertensive response.               No ischemia/infarct. No date: Hyperlipidemia No date: Hypertension No date: Palpitations No date: Parkinson disease (HCC) No date: Personal history of gout No date: Possible Lacunar infarction [facility])     Comment:  a. 12/2011 Head CT: CSF density lateral to R cerebral               peduncle - ? prior lacunar infarct. No date: Pre-syncope     Comment:  a. 06/2014 Monitor: Sinus rhythm. No significant               arrhythmia. No date: Resting tremor No date: Stroke [facility]) No date: Vascular parkinsonism (HCC) No date: Vertigo  Past Surgical History: No date: APPENDECTOMY 02/04/2023:  COLONOSCOPY WITH PROPOFOL; N/A     Comment:  Procedure: COLONOSCOPY WITH PROPOFOL;  Surgeon: [place],               Boykin Nearing, MD;  Location: [facility] ENDOSCOPY;  Service:               Gastroenterology;  Laterality: N/A; 02/04/2023: ESOPHAGOGASTRODUODENOSCOPY (EGD) WITH PROPOFOL; N/A     Comment:  Procedure: ESOPHAGOGASTRODUODENOSCOPY (EGD) WITH               PROPOFOL;  Surgeon: Toledo, Boykin Nearing, MD;  Location:               [facility] ENDOSCOPY;  Service: Gastroenterology;  Laterality:               N/A; ?: HEMORRHOID BANDING     Comment:  Dr Maryruth Bun  BMI    Body Mass Index: 28.48 kg/m      Reproductive/Obstetrics negative OB ROS                             Anesthesia Physical Anesthesia Plan  ASA: 3  Anesthesia Plan: General   Post-op Pain Management:    Induction: Intravenous  PONV Risk Score and Plan: Propofol infusion and TIVA  Airway Management Planned: Natural Airway and Nasal Cannula  Additional Equipment:   Intra-op Plan:   Post-operative Plan:   Informed Consent: I  have reviewed the patients History and Physical, chart, labs and discussed the procedure including the risks, benefits and alternatives for the proposed anesthesia with the patient or authorized representative who has indicated his/her understanding and acceptance.     Dental Advisory Given  Plan Discussed with: Anesthesiologist, CRNA and Surgeon  Anesthesia Plan Comments: (Patient consented for risks of anesthesia including but not limited to:  - adverse reactions to medications - risk of airway placement if required - damage to eyes, teeth, lips or other oral mucosa - nerve damage due to positioning  - sore throat or hoarseness - Damage to heart, brain, nerves, lungs, other parts of body or loss of life  Patient voiced understanding and assent.)       Anesthesia Quick Evaluation

## 2023-06-10 NOTE — Transfer of Care (Signed)
 Immediate Anesthesia Transfer of Care Note  Patient: Trevor Flores  Procedure(s) Performed: COLONOSCOPY WITH PROPOFOL ESOPHAGOGASTRODUODENOSCOPY (EGD) WITH PROPOFOL POLYPECTOMY, INTESTINE  Patient Location: PACU  Anesthesia Type:General  Level of Consciousness: sedated  Airway & Oxygen Therapy: Patient Spontanous Breathing  Post-op Assessment: Report given to RN and Post -op Vital signs reviewed and stable  Post vital signs: Reviewed and stable  Last Vitals:  Vitals Value Taken Time  BP    Temp    Pulse 74 06/10/23 1146  Resp 17 06/10/23 1146  SpO2 100 % 06/10/23 1146  Vitals shown include unfiled device data.  Last Pain:  Vitals:   06/10/23 1010  TempSrc: Temporal  PainSc: 0-No pain         Complications: No notable events documented.

## 2023-06-11 ENCOUNTER — Other Ambulatory Visit: Payer: Self-pay | Admitting: Gastroenterology

## 2023-06-11 ENCOUNTER — Encounter: Payer: Self-pay | Admitting: Internal Medicine

## 2023-06-11 DIAGNOSIS — D649 Anemia, unspecified: Secondary | ICD-10-CM

## 2023-06-11 DIAGNOSIS — R195 Other fecal abnormalities: Secondary | ICD-10-CM

## 2023-06-11 DIAGNOSIS — R933 Abnormal findings on diagnostic imaging of other parts of digestive tract: Secondary | ICD-10-CM

## 2023-06-11 LAB — SURGICAL PATHOLOGY

## 2023-06-12 ENCOUNTER — Ambulatory Visit
Admission: RE | Admit: 2023-06-12 | Discharge: 2023-06-12 | Disposition: A | Discharge status: Home / Self Care | Source: Ambulatory Visit | Attending: Gastroenterology

## 2023-06-12 DIAGNOSIS — R195 Other fecal abnormalities: Secondary | ICD-10-CM

## 2023-06-12 DIAGNOSIS — R933 Abnormal findings on diagnostic imaging of other parts of digestive tract: Secondary | ICD-10-CM

## 2023-06-12 DIAGNOSIS — D649 Anemia, unspecified: Secondary | ICD-10-CM

## 2023-06-12 MED ORDER — IOPAMIDOL (ISOVUE-300) INJECTION 61%
100.0000 mL | Freq: Once | INTRAVENOUS | Status: AC | PRN
  Administered 2023-06-12: 100 mL via INTRAVENOUS

## 2023-06-17 ENCOUNTER — Inpatient Hospital Stay

## 2023-06-17 ENCOUNTER — Telehealth: Payer: Self-pay

## 2023-06-17 ENCOUNTER — Inpatient Hospital Stay: Attending: Oncology | Admitting: Oncology

## 2023-06-17 ENCOUNTER — Encounter: Payer: Self-pay | Admitting: Oncology

## 2023-06-17 VITALS — BP 125/83 | HR 90 | Temp 97.4°F | Resp 19 | Wt 209.9 lb

## 2023-06-17 DIAGNOSIS — D509 Iron deficiency anemia, unspecified: Secondary | ICD-10-CM | POA: Insufficient documentation

## 2023-06-17 DIAGNOSIS — Z79899 Other long term (current) drug therapy: Secondary | ICD-10-CM | POA: Diagnosis not present

## 2023-06-17 DIAGNOSIS — Z7982 Long term (current) use of aspirin: Secondary | ICD-10-CM | POA: Insufficient documentation

## 2023-06-17 DIAGNOSIS — D649 Anemia, unspecified: Secondary | ICD-10-CM | POA: Diagnosis not present

## 2023-06-17 DIAGNOSIS — C787 Secondary malignant neoplasm of liver and intrahepatic bile duct: Secondary | ICD-10-CM | POA: Insufficient documentation

## 2023-06-17 DIAGNOSIS — R634 Abnormal weight loss: Secondary | ICD-10-CM | POA: Diagnosis not present

## 2023-06-17 DIAGNOSIS — C189 Malignant neoplasm of colon, unspecified: Secondary | ICD-10-CM | POA: Diagnosis not present

## 2023-06-17 DIAGNOSIS — N189 Chronic kidney disease, unspecified: Secondary | ICD-10-CM

## 2023-06-17 DIAGNOSIS — D500 Iron deficiency anemia secondary to blood loss (chronic): Secondary | ICD-10-CM

## 2023-06-17 DIAGNOSIS — C186 Malignant neoplasm of descending colon: Secondary | ICD-10-CM | POA: Diagnosis present

## 2023-06-17 DIAGNOSIS — K769 Liver disease, unspecified: Secondary | ICD-10-CM

## 2023-06-17 DIAGNOSIS — Z7189 Other specified counseling: Secondary | ICD-10-CM

## 2023-06-17 DIAGNOSIS — K5900 Constipation, unspecified: Secondary | ICD-10-CM

## 2023-06-17 LAB — CBC WITH DIFFERENTIAL/PLATELET
Abs Immature Granulocytes: 0.05 10*3/uL (ref 0.00–0.07)
Basophils Absolute: 0.1 10*3/uL (ref 0.0–0.1)
Basophils Relative: 1 %
Eosinophils Absolute: 0 10*3/uL (ref 0.0–0.5)
Eosinophils Relative: 0 %
HCT: 28.9 % — ABNORMAL LOW (ref 39.0–52.0)
Hemoglobin: 8.8 g/dL — ABNORMAL LOW (ref 13.0–17.0)
Immature Granulocytes: 1 %
Lymphocytes Relative: 14 %
Lymphs Abs: 0.9 10*3/uL (ref 0.7–4.0)
MCH: 25.4 pg — ABNORMAL LOW (ref 26.0–34.0)
MCHC: 30.4 g/dL (ref 30.0–36.0)
MCV: 83.3 fL (ref 80.0–100.0)
Monocytes Absolute: 0.6 10*3/uL (ref 0.1–1.0)
Monocytes Relative: 9 %
Neutro Abs: 4.9 10*3/uL (ref 1.7–7.7)
Neutrophils Relative %: 75 %
Platelets: 257 10*3/uL (ref 150–400)
RBC: 3.47 MIL/uL — ABNORMAL LOW (ref 4.22–5.81)
RDW: 15.7 % — ABNORMAL HIGH (ref 11.5–15.5)
WBC: 6.6 10*3/uL (ref 4.0–10.5)
nRBC: 0.3 % — ABNORMAL HIGH (ref 0.0–0.2)

## 2023-06-17 LAB — IRON AND TIBC
Iron: 29 ug/dL — ABNORMAL LOW (ref 45–182)
Saturation Ratios: 9 % — ABNORMAL LOW (ref 17.9–39.5)
TIBC: 335 ug/dL (ref 250–450)
UIBC: 306 ug/dL

## 2023-06-17 LAB — COMPREHENSIVE METABOLIC PANEL WITH GFR
ALT: 19 U/L (ref 0–44)
AST: 61 U/L — ABNORMAL HIGH (ref 15–41)
Albumin: 3.8 g/dL (ref 3.5–5.0)
Alkaline Phosphatase: 175 U/L — ABNORMAL HIGH (ref 38–126)
Anion gap: 15 (ref 5–15)
BUN: 20 mg/dL (ref 8–23)
CO2: 20 mmol/L — ABNORMAL LOW (ref 22–32)
Calcium: 8.9 mg/dL (ref 8.9–10.3)
Chloride: 101 mmol/L (ref 98–111)
Creatinine, Ser: 1.26 mg/dL — ABNORMAL HIGH (ref 0.61–1.24)
GFR, Estimated: >60 mL/min (ref >60)
Glucose, Bld: 97 mg/dL (ref 70–99)
Potassium: 3.7 mmol/L (ref 3.5–5.1)
Sodium: 136 mmol/L (ref 135–145)
Total Bilirubin: 0.9 mg/dL (ref 0.0–1.2)
Total Protein: 8.2 g/dL — ABNORMAL HIGH (ref 6.5–8.1)

## 2023-06-17 LAB — RETIC PANEL
Immature Retic Fract: 30 % — ABNORMAL HIGH (ref 2.3–15.9)
RBC.: 3.47 MIL/uL — ABNORMAL LOW (ref 4.22–5.81)
Retic Count, Absolute: 83.3 10*3/uL (ref 19.0–186.0)
Retic Ct Pct: 2.4 % (ref 0.4–3.1)
Reticulocyte Hemoglobin: 24.7 pg — ABNORMAL LOW (ref >27.9)

## 2023-06-17 LAB — APTT: aPTT: 30 s (ref 24–36)

## 2023-06-17 LAB — FERRITIN: Ferritin: 93 ng/mL (ref 24–336)

## 2023-06-17 LAB — PROTIME-INR
INR: 1.4 — ABNORMAL HIGH (ref 0.8–1.2)
Prothrombin Time: 17 s — ABNORMAL HIGH (ref 11.4–15.2)

## 2023-06-17 MED ORDER — SENNOSIDES-DOCUSATE SODIUM 8.6-50 MG PO TABS: 2.0000 | ORAL_TABLET | Freq: Every day | ORAL | 1 refills | Status: DC

## 2023-06-17 NOTE — Assessment & Plan Note (Signed)
 Due to partially obstructed colon mass. Recommend patient to use Senokot-S 2 tablet daily and MiraLAX daily as needed. Surgical evaluation for the need of elective direct colostomy.

## 2023-06-17 NOTE — Addendum Note (Signed)
 Addended by: [name] on: 06/17/2023 09:55 PM   Modules accepted: Orders

## 2023-06-17 NOTE — Assessment & Plan Note (Addendum)
 Labs are reviewed and discussed with patient. Lab Results  Component Value Date   HGB 8.8 (L) 06/17/2023   TIBC 335 06/17/2023   IRONPCTSAT 9 (L) 06/17/2023   FERRITIN 93 06/17/2023    Plan IV Venofer treatments. Rationale and potential side effects were reviewed and discussed with patient.  He agrees with the plan.

## 2023-06-17 NOTE — Telephone Encounter (Signed)
 Request for liver biosy sent to IR. Date pending.

## 2023-06-17 NOTE — Progress Notes (Addendum)
 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 Endoscopy findings, biopsy results, CT imaging findings were reviewed and discussed with patient and his wife. Unfortunately, colon cancer appears to have metastasis to liver. I recommend ultrasound-guided liver biopsy to confirm distant metastasis.  Patient is on aspirin 81 mg and asked him to hold temporarily prior to biopsy. Check NGS, MMR status and HER2 status. I recommend systemic chemotherapy with FOLFOX with bevacizumab or anti-EGFR antibodies depending on NGS results. Will arrange patient to get chemotherapy education. Rationale and potential side effects of chemotherapy were reviewed in details with patient and his wife.  He agrees with the plan.  Partially obstructed colon mass, however for patient to establish care with surgery for evaluation of need of elective diverting colostomy versus watchful waiting.  He will also need to have a Mediport placement. Check CBC, CMP CEA. Referred to palliative care service.  Goals of care, counseling/discussion Discussed with patient and wife.  CKD (chronic kidney disease) Encourage oral hydration and avoid nephrotoxins.    Weight loss Plan to refer patient to nutritionist  Normocytic anemia Labs are reviewed and discussed with patient. Lab Results  Component Value Date   HGB 8.8 (L) 06/17/2023   TIBC 335 06/17/2023   IRONPCTSAT 9 (L) 06/17/2023   FERRITIN 93 06/17/2023    Plan IV Venofer treatments. Rationale and potential side effects were reviewed and discussed with patient.  He agrees with the plan.  Constipation Due to partially obstructed colon mass. Recommend patient to use Senokot-S 2 tablet daily and MiraLAX daily as  needed. Surgical evaluation for the need of elective direct colostomy.   Orders Placed This Encounter  Procedures   US BIOPSY (LIVER)    Standing Status:   Future    Expiration Date:   06/16/2024    Lab orders requested (DO NOT place separate lab orders, these will be automatically ordered during procedure specimen collection)::   Surgical Pathology    Reason for Exam (SYMPTOM  OR DIAGNOSIS REQUIRED):   liver lesion    Preferred location?:   [facility]   Ferritin    Standing Status:   Future    Number of Occurrences:   1    Expected Date:   06/17/2023    Expiration Date:   12/17/2023   Iron and TIBC    Standing Status:   Future    Number of Occurrences:   1    Expected Date:   06/17/2023    Expiration Date:   06/16/2024   CBC with Differential/Platelet    Standing Status:   Future    Number of Occurrences:   1    Expected Date:   06/17/2023    Expiration Date:   06/16/2024   Comprehensive metabolic panel with GFR    Standing Status:   Future    Number of Occurrences:   1    Expected Date:   06/17/2023    Expiration Date:   06/16/2024   CEA    Standing Status:   Future    Number of Occurrences:   1    Expected Date:   06/17/2023    Expiration Date:   06/16/2024   Retic Panel    Standing Status:   Future    Number of Occurrences:   1  Expected Date:   06/17/2023    Expiration Date:   06/16/2024   Protime-INR    Standing Status:   Future    Number of Occurrences:   1    Expected Date:   06/17/2023    Expiration Date:   06/16/2024   APTT    Standing Status:   Future    Number of Occurrences:   1    Expected Date:   06/17/2023    Expiration Date:   06/16/2024   Ambulatory referral to General Surgery    Referral Priority:   Routine    Referral Type:   Surgical    Referral Reason:   Specialty Services Required    Requested Specialty:   General Surgery    Number of Visits Requested:   1   Follow-up to be determined. All questions were answered. The patient knows to call  the clinic with any problems, questions or concerns.  [name], MD, PhD [facility] Hematology Oncology 06/17/2023    HISTORY OF PRESENTING ILLNESS:  Trevor Flores 63 y.o. male presents to establish care for colon cancer I have reviewed his chart and materials related to his cancer extensively and collaborated history with the patient. Summary of oncologic history is as follows: Oncology History  Adenocarcinoma of colon (HCC)  06/12/2023 Imaging   CT chest abdomen pelvis with contrast showed  1. Large, circumferential mass of the very redundant mid sigmoid colon, measuring 6.5 cm in length and 4.2 x 4.0 cm in diameter, consistent with primary colonic malignancy. 2. Enlarged lymph nodes or metastatic soft tissue mass in the sigmoid mesocolon as well as an enlarged left iliac lymph node, consistent with nodal metastatic disease. 3. Numerous bulky hypodense liver lesions of varying sizes seen throughout the liver, consistent with hepatic metastatic disease. 4. No evidence of lymphadenopathy or metastatic disease in the chest. 5. Coronary artery disease.     06/17/2023 Initial Diagnosis   Adenocarcinoma of colon (HCC)  He has been experiencing increased constipation, requiring the use of Miralax and Colace every two days to manage bowel movements. Despite these measures, constipation persists.  He has also experienced unintentional weight loss 06/10/2023 patient's status post upper endoscopy and colonoscopy. Colonoscopy showed ascending colon 15 mm polyp which was resected and retrieved. A fungating infiltrative ulcerated partially obstructing large mass found in the mid descending colon.  The mass was circumferential.  5 cm in length.  Mass was biopsied.  Pathology showed 1. Ascending  Colon Polyp, hot snare :       - TUBULAR ADENOMA.       - NEGATIVE FOR HIGH-GRADE DYSPLASIA AND MALIGNANCY.        2. Sigmoid Colon Biopsy, mass, mid, cbx :       - INVASIVE MODERATELY  DIFFERENTIATED COLORECTAL ADENOCARCINOMA WITH MUCINOUS       FEATURES.        06/17/2023 Cancer Staging   Staging form: Colon and Rectum, AJCC 8th Edition - Clinical stage from 06/17/2023: Stage Unknown (cTX, cN1, cM1) - Signed by [name], MD on 06/17/2023 Stage prefix: Initial diagnosis    Patient is companied by his wife. Patient has history of Parkinson's disease, and follows up with neurology Dr. Mason Sole.  Patient is on carbidopa/levodopa History of remote stroke, currently on aspirin 81 mg and statin. He denies any pain currently.  Occasionally he has noticed blood in the stool. He has significant claustrophobia.    MEDICAL HISTORY:  Past Medical History:  Diagnosis Date  Generalized osteoarthritis of multiple sites    GERD (gastroesophageal reflux disease)    Heart murmur    a. 12/2011 Echo: EF 60%, no rwma.   Hemiparesis affecting right side as late effect of cerebrovascular accident (CVA) (HCC)    History of stress test    a. 04/2004 Ex MV: Ex time 10:30. Hypertensive response. No ischemia/infarct.   Hyperlipidemia    Hypertension    Palpitations    Parkinson disease (HCC)    Personal history of gout    Possible Lacunar infarction (HCC)    a. 12/2011 Head CT: CSF density lateral to R cerebral peduncle - ? prior lacunar infarct.   Pre-syncope    a. 06/2014 Monitor: Sinus rhythm. No significant arrhythmia.   Resting tremor    Stroke [facility])    Vascular parkinsonism (HCC)    Vertigo     SURGICAL HISTORY: Past Surgical History:  Procedure Laterality Date   APPENDECTOMY     COLONOSCOPY WITH PROPOFOL N/A 02/04/2023   Procedure: COLONOSCOPY WITH PROPOFOL;  Surgeon: Toledo, Boykin Nearing, MD;  Location: [facility] ENDOSCOPY;  Service: Gastroenterology;  Laterality: N/A;   COLONOSCOPY WITH PROPOFOL N/A 06/10/2023   Procedure: COLONOSCOPY WITH PROPOFOL;  Surgeon: Toledo, Boykin Nearing, MD;  Location: [facility] ENDOSCOPY;  Service: Gastroenterology;  Laterality: N/A;    ESOPHAGOGASTRODUODENOSCOPY (EGD) WITH PROPOFOL N/A 02/04/2023   Procedure: ESOPHAGOGASTRODUODENOSCOPY (EGD) WITH PROPOFOL;  Surgeon: Toledo, Boykin Nearing, MD;  Location: [facility] ENDOSCOPY;  Service: Gastroenterology;  Laterality: N/A;   ESOPHAGOGASTRODUODENOSCOPY (EGD) WITH PROPOFOL N/A 06/10/2023   Procedure: ESOPHAGOGASTRODUODENOSCOPY (EGD) WITH PROPOFOL;  Surgeon: Toledo, Boykin Nearing, MD;  Location: [facility] ENDOSCOPY;  Service: Gastroenterology;  Laterality: N/A;   HEMORRHOID BANDING  ?   Dr Maryruth Bun   POLYPECTOMY  06/10/2023   Procedure: POLYPECTOMY, INTESTINE;  Surgeon: Toledo, Boykin Nearing, MD;  Location: [facility] ENDOSCOPY;  Service: Gastroenterology;;    SOCIAL HISTORY: Social History   Socioeconomic History   Marital status: Married    Spouse name: Not on file   Number of children: Not on file   Years of education: Not on file   Highest education level: Not on file  Occupational History   Not on file  Tobacco Use   Smoking status: Never   Smokeless tobacco: Former    Types: Designer, multimedia Use   Vaping status: Never Used  Substance and Sexual Activity   Alcohol use: No   Drug use: Never   Sexual activity: Yes    Birth control/protection: None  Other Topics Concern   Not on file  Social History Narrative   Not on file   Social Drivers of Health   Financial Resource Strain: Low Risk  (11/05/2022)   Received from [facility]   Overall Financial Resource Strain (CARDIA)    Difficulty of Paying Living Expenses: Not hard at all  Food Insecurity: No Food Insecurity (06/17/2023)   Hunger Vital Sign    Worried About Running Out of Food in the Last Year: Never true    Ran Out of Food in the Last Year: Never true  Transportation Needs: No Transportation Needs (06/17/2023)   PRAPARE - Administrator, Civil Service (Medical): No    Lack of Transportation (Non-Medical): No  Physical Activity: Not on file  Stress: Not on file  Social Connections: Unknown (08/09/2022)    Received from [facility]   Social Network    Social Network: Not on file  Intimate Partner Violence: Not At Risk (06/17/2023)   Humiliation,  Afraid, Rape, and Kick questionnaire    Fear of Current or Ex-Partner: No    Emotionally Abused: No    Physically Abused: No    Sexually Abused: No    FAMILY HISTORY: Family History  Problem Relation Age of Onset   Diabetes Brother    Colon cancer Neg Hx     ALLERGIES:  is allergic to other and hydralazine.  MEDICATIONS:  Current Outpatient Medications  Medication Sig Dispense Refill   albuterol (VENTOLIN HFA) 108 (90 Base) MCG/ACT inhaler Inhale 1-2 puffs into the lungs every 4 (four) hours as needed.     allopurinol (ZYLOPRIM) 100 MG tablet Take 100 mg by mouth daily.     aspirin EC 81 MG tablet Take 1 tablet (81 mg total) by mouth daily. 90 tablet 3   carbidopa-levodopa (SINEMET IR) 25-100 MG tablet Take 1 tablet by mouth 3 (three) times daily.     diltiazem (TIAZAC) 360 MG 24 hr capsule Take 1 capsule (360 mg total) by mouth daily. 90 capsule 3   Docusate Sodium (DSS) 100 MG CAPS Take 1 capsule by mouth 2 (two) times daily.     fluticasone (FLONASE) 50 MCG/ACT nasal spray Place into both nostrils daily as needed for allergies or rhinitis.     losartan (COZAAR) 100 MG tablet TAKE 1 TABLET(100 MG) BY MOUTH DAILY IN THE MORNING 90 tablet 2   meclizine (ANTIVERT) 25 MG tablet Take 1 tablet (25 mg total) by mouth 3 (three) times daily as needed for dizziness. 30 tablet 1   meloxicam (MOBIC) 15 MG tablet TK ONE T PO QD as needed 90 tablet 0   metoprolol succinate (TOPROL-XL) 25 MG 24 hr tablet TAKE 1 TABLET(25 MG) BY MOUTH EVERY EVENING WITH FOOD 90 tablet 2   oxyCODONE-acetaminophen (PERCOCET) 7.5-325 MG tablet Take 1 tablet by mouth every 6 (six) hours as needed.     pantoprazole (PROTONIX) 40 MG tablet Take 1 tablet (40 mg total) by mouth daily. 30 tablet 0   Plecanatide (TRULANCE) 3 MG TABS Take 3 mg by mouth as needed.     rosuvastatin  (CRESTOR) 5 MG tablet Take 5 mg by mouth daily.     senna-docusate (SENOKOT-S) 8.6-50 MG tablet Take 2 tablets by mouth daily. 60 tablet 1   sildenafil (VIAGRA) 25 MG tablet Take 25 mg by mouth as needed.     Vitamin D, Ergocalciferol, (DRISDOL) 1.25 MG (50000 UNIT) CAPS capsule Take 50,000 Units by mouth once a week.     doxazosin (CARDURA) 1 MG tablet TAKE 1 TABLET BY MOUTH DAILY AS NEEDED FOR PRESSURE GREATER THAN 150 90 tablet 0   LINZESS 72 MCG capsule Take 72 mcg by mouth every morning. (Patient not taking: Reported on 03/19/2023)     loratadine (CLARITIN) 10 MG tablet Take 10 mg by mouth daily as needed for allergies. (Patient not taking: Reported on 03/19/2023)     methocarbamol (ROBAXIN) 500 MG tablet Take 1 tablet (500 mg total) by mouth at bedtime as needed for muscle spasms. (Patient not taking: Reported on 03/19/2023) 30 tablet 1   sucralfate (CARAFATE) 1 g tablet Take 1 tablet (1 g total) by mouth 4 (four) times daily. (Patient not taking: Reported on 03/19/2023) 120 tablet 1   No current facility-administered medications for this visit.    Review of Systems  Constitutional:  Positive for appetite change, fatigue and unexpected weight change. Negative for chills and fever.  HENT:   Negative for hearing loss and voice change.  Eyes:  Negative for eye problems and icterus.  Respiratory:  Negative for chest tightness, cough and shortness of breath.   Cardiovascular:  Negative for chest pain and leg swelling.  Gastrointestinal:  Positive for blood in stool. Negative for abdominal distention and abdominal pain.  Endocrine: Negative for hot flashes.  Genitourinary:  Negative for difficulty urinating, dysuria and frequency.   Musculoskeletal:  Negative for arthralgias.  Skin:  Negative for itching and rash.  Neurological:  Negative for light-headedness and numbness.  Hematological:  Negative for adenopathy. Does not bruise/bleed easily.  Psychiatric/Behavioral:  Negative for confusion.       PHYSICAL EXAMINATION: ECOG PERFORMANCE STATUS: 1 - Symptomatic but completely ambulatory  Vitals:   06/17/23 1531  BP: 125/83  Pulse: 90  Resp: 19  Temp: (!) 97.4 F (36.3 C)  SpO2: 98%   Filed Weights   06/17/23 1531  Weight: 209 lb 14.4 oz (95.2 kg)    Physical Exam Constitutional:      General: He is not in acute distress.    Appearance: He is not diaphoretic.  HENT:     Head: Normocephalic and atraumatic.  Eyes:     General: No scleral icterus. Cardiovascular:     Rate and Rhythm: Normal rate and regular rhythm.  Pulmonary:     Effort: Pulmonary effort is normal. No respiratory distress.     Breath sounds: Normal breath sounds.  Abdominal:     General: Bowel sounds are normal. There is no distension.     Palpations: Abdomen is soft.     Tenderness: There is no abdominal tenderness.  Musculoskeletal:        General: Normal range of motion.     Cervical back: Normal range of motion and neck supple.  Skin:    General: Skin is warm and dry.     Findings: No erythema.  Neurological:     Mental Status: He is alert and oriented to person, place, and time. Mental status is at baseline.     Motor: No abnormal muscle tone.     Comments: Patient has resting tremor bilaterally  Psychiatric:        Mood and Affect: Affect normal.      LABORATORY DATA:  I have reviewed the data as listed    Latest Ref Rng & Units 06/17/2023    4:19 PM 02/28/2023   11:23 AM 10/13/2022   12:47 PM  CBC  WBC 4.0 - 10.5 K/uL 6.6  5.3  4.9   Hemoglobin 13.0 - 17.0 g/dL 8.8  47.8  29.5   Hematocrit 39.0 - 52.0 % 28.9  35.8  38.5   Platelets 150 - 400 K/uL 257  224  215       Latest Ref Rng & Units 06/17/2023    4:19 PM 03/19/2023    9:34 AM 02/28/2023   11:23 AM  CMP  Glucose 70 - 99 mg/dL 97  98  621   BUN 8 - 23 mg/dL 20  16  18    Creatinine 0.61 - 1.24 mg/dL 3.08  6.57  8.46   Sodium 135 - 145 mmol/L 136  139  133   Potassium 3.5 - 5.1 mmol/L 3.7  3.5  3.4   Chloride 98  - 111 mmol/L 101  99  99   CO2 22 - 32 mmol/L 20  22  24    Calcium 8.9 - 10.3 mg/dL 8.9  9.2  8.9   Total Protein 6.5 - 8.1 g/dL 8.2  8.2   Total Bilirubin 0.0 - 1.2 mg/dL 0.9   0.5   Alkaline Phos 38 - 126 U/L 175   105   AST 15 - 41 U/L 61   37   ALT 0 - 44 U/L 19   19      RADIOGRAPHIC STUDIES: I have personally reviewed the radiological images as listed and agreed with the findings in the report. CT CHEST ABDOMEN PELVIS W CONTRAST Result Date: 06/12/2023 CLINICAL DATA:  Descending colonic mass, metastatic disease evaluation * Tracking Code: BO * EXAM: CT CHEST, ABDOMEN, AND PELVIS WITH CONTRAST TECHNIQUE: Multidetector CT imaging of the chest, abdomen and pelvis was performed following the standard protocol during bolus administration of intravenous contrast. RADIATION DOSE REDUCTION: This exam was performed according to the departmental dose-optimization program which includes automated exposure control, adjustment of the mA and/or kV according to patient size and/or use of iterative reconstruction technique. CONTRAST:  100mL ISOVUE-300 IOPAMIDOL (ISOVUE-300) INJECTION 61% COMPARISON:  None Available. FINDINGS: Examination is generally limited by dense streak artifact related to patient arm positioning. CT CHEST FINDINGS Cardiovascular: Aortic atherosclerosis. Normal heart size. Left and right coronary artery calcifications no pericardial effusion. Mediastinum/Nodes: No enlarged mediastinal, hilar, or axillary lymph nodes. Thyroid gland, trachea, and esophagus demonstrate no significant findings. Lungs/Pleura: Lungs are clear. No pleural effusion or pneumothorax. Musculoskeletal: No chest wall abnormality. No acute osseous findings. CT ABDOMEN PELVIS FINDINGS Hepatobiliary: Numerous bulky hypodense liver lesions of varying sizes seen throughout the liver, large index lesion of the inferior right lobe measuring 5.6 x 5.6 cm (series 4, image 56). No gallstones, gallbladder wall thickening, or  biliary dilatation. Pancreas: Unremarkable. No pancreatic ductal dilatation or surrounding inflammatory changes. Spleen: Normal in size without significant abnormality. Adrenals/Urinary Tract: Adrenal glands are unremarkable. Multiple benign bilateral renal cortical cysts, requiring no specific further follow-up or characterization. No calculi or hydronephrosis. Bladder is unremarkable. Stomach/Bowel: Stomach is within normal limits. Large, circumferential mass of the very redundant mid sigmoid colon, measuring 6.5 cm in length and 4.2 x 4.0 cm in diameter (series 8, image 116, series 4, image 91) Vascular/Lymphatic: Aortic atherosclerosis. Enlarged lymph nodes or metastatic soft tissue mass in the sigmoid mesocolon measuring 2.2 x 2.2 cm (series 4, image 88). Enlarged left iliac lymph node measuring 2.0 x 1.7 cm (series 4, image 62). Reproductive: No mass or other abnormality. Other: No abdominal wall hernia or abnormality. No ascites. Musculoskeletal: No acute osseous findings. IMPRESSION: 1. Large, circumferential mass of the very redundant mid sigmoid colon, measuring 6.5 cm in length and 4.2 x 4.0 cm in diameter, consistent with primary colonic malignancy. 2. Enlarged lymph nodes or metastatic soft tissue mass in the sigmoid mesocolon as well as an enlarged left iliac lymph node, consistent with nodal metastatic disease. 3. Numerous bulky hypodense liver lesions of varying sizes seen throughout the liver, consistent with hepatic metastatic disease. 4. No evidence of lymphadenopathy or metastatic disease in the chest. 5. Coronary artery disease. These results will be called to the ordering clinician or representative by the Radiologist Assistant, and communication documented in the PACS or [employer]. Aortic Atherosclerosis (ICD10-I70.0). Electronically Signed   By: Fredricka Jenny M.D.   On: 06/12/2023 14:53

## 2023-06-17 NOTE — Assessment & Plan Note (Addendum)
 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 Endoscopy findings, biopsy results, CT imaging findings were reviewed and discussed with patient and his wife. Unfortunately, colon cancer appears to have metastasis to liver. I recommend ultrasound-guided liver biopsy to confirm distant metastasis.  Patient is on aspirin 81 mg and asked him to hold temporarily prior to biopsy. Check NGS, MMR status and HER2 status. I recommend systemic chemotherapy with FOLFOX with bevacizumab or anti-EGFR antibodies depending on NGS results. Will arrange patient to get chemotherapy education. Rationale and potential side effects of chemotherapy were reviewed in details with patient and his wife.  He agrees with the plan.  Partially obstructed colon mass, however for patient to establish care with surgery for evaluation of need of elective diverting colostomy versus watchful waiting.  He will also need to have a Mediport placement. Check CBC, CMP CEA. Referred to palliative care service.

## 2023-06-17 NOTE — Assessment & Plan Note (Signed)
 Plan to refer patient to nutritionist

## 2023-06-17 NOTE — Assessment & Plan Note (Signed)
Discussed with patient and wife. 

## 2023-06-17 NOTE — Assessment & Plan Note (Addendum)
 Encourage oral hydration and avoid nephrotoxins.

## 2023-06-18 ENCOUNTER — Telehealth: Payer: Self-pay

## 2023-06-18 ENCOUNTER — Encounter: Payer: Self-pay | Admitting: Oncology

## 2023-06-18 DIAGNOSIS — R634 Abnormal weight loss: Secondary | ICD-10-CM

## 2023-06-18 DIAGNOSIS — C189 Malignant neoplasm of colon, unspecified: Secondary | ICD-10-CM

## 2023-06-18 NOTE — Telephone Encounter (Signed)
 [facility] NGS testing requested on specimen [id number] collected on 4/[id number]

## 2023-06-18 NOTE — Telephone Encounter (Signed)
 Financial assistance application has been completed and approved. Out-of-pocket cost will be $0

## 2023-06-18 NOTE — Progress Notes (Signed)
 Roxie Cord, MD sent to [facility] Approved for US  guided biopsy of liver lesion.  HKM

## 2023-06-18 NOTE — Telephone Encounter (Signed)
-----   Message from [name] sent at 06/17/2023  9:53 PM EDT ----- Please let patient know he has iron deficiency anemia and I recommend IV Venofer treatments.  If he agrees please schedule patient for get Venofer weekly x 2, first dose this week if possible. Weight loss, refer to nutritionist.  Also referred to establish care with palliative care service.

## 2023-06-18 NOTE — Telephone Encounter (Signed)
 Pt has been scheduled for liver bx on Tues 4/29@ 9am, arrive 10am.

## 2023-06-18 NOTE — Telephone Encounter (Signed)
 Called and spoke to pt about results and the need for IV venofer.   Pt would like a call with appt:   Venfoer weekly x2 (first dose this week and fist dose is NEW) Appt with Joli Appt with Palliative

## 2023-06-18 NOTE — Telephone Encounter (Signed)
 Referral to surgery faxed to Dr. Monda Angry office. Fax confirmation received.   Re: adenocarcinoma of colon and port placement.   Ph: [phone] Fax: [phone]

## 2023-06-19 ENCOUNTER — Inpatient Hospital Stay

## 2023-06-19 LAB — CEA: CEA: 30210 ng/mL — ABNORMAL HIGH (ref 0.0–4.7)

## 2023-06-19 NOTE — Progress Notes (Signed)
 [facility] CSW Progress Note  Clinical Social Work introduced self to patient during Patient Education with Octavio Ben, Charity fundraiser.  Provided information regarding CSW role, including counseling, advanced care planning and support group.  Answered questions as needed.  Kennth Peal, LCSW Clinical Social Worker [facility]

## 2023-06-20 ENCOUNTER — Telehealth: Payer: Self-pay | Admitting: *Deleted

## 2023-06-20 ENCOUNTER — Inpatient Hospital Stay

## 2023-06-20 VITALS — BP 132/83 | HR 94 | Temp 97.8°F | Resp 14

## 2023-06-20 DIAGNOSIS — C186 Malignant neoplasm of descending colon: Secondary | ICD-10-CM | POA: Diagnosis not present

## 2023-06-20 DIAGNOSIS — D500 Iron deficiency anemia secondary to blood loss (chronic): Secondary | ICD-10-CM

## 2023-06-20 DIAGNOSIS — C189 Malignant neoplasm of colon, unspecified: Secondary | ICD-10-CM

## 2023-06-20 MED ORDER — IRON SUCROSE 20 MG/ML IV SOLN
200.0000 mg | Freq: Once | INTRAVENOUS | Status: AC
  Administered 2023-06-20: 200 mg via INTRAVENOUS
  Filled 2023-06-20: qty 10

## 2023-06-20 MED ORDER — SODIUM CHLORIDE 0.9 % IV SOLN
INTRAVENOUS | Status: DC
  Filled 2023-06-20: qty 250

## 2023-06-20 MED ORDER — SODIUM CHLORIDE 0.9% FLUSH
10.0000 mL | Freq: Once | INTRAVENOUS | Status: AC | PRN
  Administered 2023-06-20: 10 mL
  Filled 2023-06-20: qty 10

## 2023-06-20 NOTE — Telephone Encounter (Signed)
 He wanted to know if the urine color can change in color. I called him back and said that it can for 1-2 days but does not do for everyone. He is ok know

## 2023-06-20 NOTE — Patient Instructions (Signed)

## 2023-06-20 NOTE — Progress Notes (Signed)
Patient tolerated Venofer infusion well, no questions/concerns voiced. Monitored 30 min post transfusion. Patient stable at discharge. VSS. AVS given.

## 2023-06-24 ENCOUNTER — Ambulatory Visit: Payer: Self-pay | Admitting: General Surgery

## 2023-06-24 ENCOUNTER — Telehealth: Payer: Self-pay | Admitting: *Deleted

## 2023-06-24 ENCOUNTER — Encounter
Admission: RE | Admit: 2023-06-24 | Discharge: 2023-06-24 | Disposition: A | Discharge status: Home / Self Care | Source: Ambulatory Visit | Attending: General Surgery | Admitting: General Surgery

## 2023-06-24 ENCOUNTER — Encounter: Payer: Self-pay | Admitting: General Surgery

## 2023-06-24 ENCOUNTER — Other Ambulatory Visit: Payer: Self-pay

## 2023-06-24 HISTORY — DX: Chronic kidney disease, unspecified: N18.9

## 2023-06-24 HISTORY — DX: Angina pectoris, unspecified: I20.9

## 2023-06-24 HISTORY — DX: Dyspnea, unspecified: R06.00

## 2023-06-24 HISTORY — DX: Atherosclerotic heart disease of native coronary artery without angina pectoris: I25.10

## 2023-06-24 NOTE — H&P (Signed)
 History of Present Illness Trevor Flores is a 64 year old male with stage four cancer who presents for evaluation for insertion of a port-a-cath.  He requires a port-a-cath for his upcoming chemotherapy treatment, as recommended by medical oncology, to ensure reliable access for chemotherapy administration and blood draws, minimizing pain and preserving smaller veins.  He has been feeling 'a lot better' recently, although he experiences shortness of breath. A recent scan did not reveal any major pulmonary issues. He also has Parkinson's disease, which may contribute to his fatigue and tiredness.  He is currently on dialysis and uses a numbing cream for his dialysis access, which he inquired about using for the port-a-cath as well.  There was a discussion about dietary restrictions, specifically regarding seafood. He mentioned that he does not eat sushi and has no desire for seafood at this time.      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        PAST SURGICAL HISTORY:   Past Surgical History:  Procedure Laterality Date   Colon @ [facility]  06/10/2023   INVASIVE MODERATELY DIFFERENTIATED COLORECTAL ADENOCARCINOMA/Repeat 6 months/TKT   EGD @ [facility]  06/10/2023   Normal examined EGD/No repeat/TKT   APPENDECTOMY           MEDICATIONS:  Outpatient Encounter Medications as of 06/24/2023  Medication Sig Dispense Refill   albuterol MDI, PROVENTIL, VENTOLIN, PROAIR, HFA 90 mcg/actuation inhaler INHALE 2 INHALATIONS INTO THE LUNGS EVERY 6 HOURS AS NEEDED FOR WHEEZING 18 g 0   allopurinoL (ZYLOPRIM) 100 MG tablet TAKE 1 TABLET(100  MG) BY MOUTH DAILY 90 tablet 1   aspirin  81 MG EC tablet Take 81 mg by mouth once daily     carbidopa-levodopa (SINEMET) 25-100 mg tablet Take 1 tablet by mouth 3 (three) times daily 90 tablet 11   dilTIAZem  (TIAZAC ) 360 MG ER capsule Take 1 capsule (360 mg total) by mouth once daily 90 capsule 1   docusate (COLACE) 100 MG capsule Take 1 capsule (100 mg total) by mouth 2 (two) times daily 60 capsule 3   doxazosin  (CARDURA ) 2 MG tablet Take 2 mg by mouth nightly     ergocalciferol, vitamin D2, 1,250 mcg (50,000 unit) capsule TAKE 1 CAPSULE BY MOUTH 1 TIME A WEEK FOR 8 DOSES 8 capsule 3   ergocalciferol, vitamin D2, 1,250 mcg (50,000 unit) capsule TAKE 1 CAPSULE BY MOUTH 1 TIME A WEEK FOR 8 DOSES 8 capsule 3   loratadine (CLARITIN) 10 mg tablet Take 10 mg by mouth once daily     LORazepam (ATIVAN) 1 MG tablet Take 1 pill 60 min before MRI and 2nd pill 15 min before MRI if needed 2 tablet 0   pantoprazole  (PROTONIX ) 40 MG DR tablet Take 1 tablet (40 mg total) by mouth 2 (two) times daily before meals 60 tablet 3   polyethylene glycol (MIRALAX ) powder Take by mouth once daily     rosuvastatin (CRESTOR) 5 MG tablet TAKE 1 TABLET(5 MG) BY MOUTH DAILY 90 tablet 1   sennosides-docusate (SENOKOT-S) 8.6-50 mg tablet Take 2 tablets by mouth once daily     tiotropium (SPIRIVA) 18 mcg  inhalation capsule Place 1 capsule (18 mcg total) into inhaler and inhale once daily 30 capsule 6   losartan  (COZAAR ) 100 MG tablet Take 1 tablet (100 mg total) by mouth once daily for 180 days 90 tablet 1   metoprolol  succinate (TOPROL -XL) 50 MG XL tablet Take 1 tablet (50 mg total) by mouth once daily (Patient taking differently: Take 25 mg by mouth once daily) 90 tablet 1   sildenafiL (VIAGRA) 25 MG tablet Take 1 tablet (25 mg total) by mouth once daily as needed for Erectile Dysfunction for up to 30 days 30 tablet 0   sodium, potassium, and magnesium (SUPREP) oral solution Take 1 Bottle by mouth as directed One kit contains 2  bottles.  Take both bottles at the times instructed by your provider. (Patient not taking: Reported on 06/24/2023) 354 mL 0   No facility-administered encounter medications on file as of 06/24/2023.     ALLERGIES:   Hydralazine    SOCIAL HISTORY:  Social History   Socioeconomic History   Marital status: Married  Tobacco Use   Smoking status: Former    Types: Cigarettes   Smokeless tobacco: Former    Types: Associate Professor status: Never Used  Substance and Sexual Activity   Alcohol use: Never   Drug use: Defer   Sexual activity: Defer   Social Drivers of Corporate investment banker Strain: Low Risk  (11/05/2022)   Overall Financial Resource Strain (CARDIA)    Difficulty of Paying Living Expenses: Not hard at all  Food Insecurity: No Food Insecurity (06/17/2023)   Received from [facility]   Hunger Vital Sign    Worried About Running Out of Food in the Last Year: Never true    Ran Out of Food in the Last Year: Never true  Transportation Needs: No Transportation Needs (06/17/2023)   Received from [facility] - Transportation    Lack of Transportation (Medical): No    Lack of Transportation (Non-Medical): No    FAMILY HISTORY:  Family History  Problem Relation Name Age of Onset   High blood pressure (Hypertension) Mother     Hyperlipidemia (Elevated cholesterol) Mother     High blood pressure (Hypertension) Father     Tremor Paternal Grandfather       GENERAL REVIEW OF SYSTEMS:   General ROS: negative for - chills, fatigue, fever, weight gain or weight loss Allergy and Immunology ROS: negative for - hives  Hematological and Lymphatic ROS: negative for - bleeding problems or bruising, negative for palpable nodes Endocrine ROS: negative for - heat or cold intolerance, hair changes Respiratory ROS: negative for - cough, positive shortness of breath  Cardiovascular ROS: no chest pain or palpitations GI ROS: negative for nausea, vomiting, abdominal pain,  diarrhea, constipation Musculoskeletal ROS: negative for - joint swelling or muscle pain Neurological ROS: negative for - confusion, syncope Dermatological ROS: negative for pruritus and rash  PHYSICAL EXAM:  Vitals:   06/24/23 1105  BP: 124/79  Pulse: (!) 116  .  Ht:182.9 cm (6') Wt:96.6 kg (213 lb) WJX:BJYN surface area is 2.22 meters squared. Body mass index is 28.89 kg/m.Trevor Flores   GENERAL: Alert, active, oriented x3  HEENT: Pupils equal reactive to light. Extraocular movements are intact. Sclera clear. Palpebral conjunctiva normal red color.Pharynx clear.  NECK: Supple with no palpable mass and no adenopathy.  LUNGS: Sound clear with no rales rhonchi or wheezes.  HEART: Regular rhythm S1 and S2 without murmur.  ABDOMEN:  Soft and depressible, nontender with no palpable mass, no hepatomegaly.   EXTREMITIES: Well-developed well-nourished symmetrical with no dependent edema.  NEUROLOGICAL: Awake alert oriented, facial expression symmetrical, moving all extremities.   Results RADIOLOGY CT scan of the chest abdomen and pelvis shows mass of the sigmoid colon.  Metastatic disease to the liver.  No significant pulmonary pathology.  I personally evaluated the images  I personally evaluated the colonoscopy on June 10, 2023.  Large mass of the sigmoid colon.  Other smaller polyps in the ascending colon.    Assessment & Plan Stage 4 cancer   Stage 4 cancer requires chemotherapy. He was evaluated by medical oncology and advised to proceed with chemotherapy. A port-a-cath insertion is scheduled to facilitate administration. The procedure involves creating a pocket under the skin and tunneling the catheter to a vein near the heart. Benefits include reliable access and reduced pain compared to peripheral veins. Risks such as bleeding, infection, and potential lung collapse were discussed, with ultrasound guidance to mitigate these risks. He understands and is ready to proceed. Proceed with  port-a-cath insertion on Thursday. Administer anesthesia and use ultrasound guidance to minimize lung collapse risk. Provide post-operative care instructions, including normal showering with soap and water, with no need for dressing changes.   Parkinson's disease   Parkinson's disease contributes to fatigue and shortness of breath. No major findings on lung scan to explain shortness of breath, likely related to cancer or Parkinson's disease progression.   Malignant neoplasm of sigmoid colon (CMS/HHS-HCC) [C18.7]          Patient verbalized understanding, all questions were answered, and were agreeable with the plan outlined above.   [name], MD  Electronically signed by [name], MD

## 2023-06-24 NOTE — Telephone Encounter (Signed)
-----   Message from Shirline Dover sent at 06/24/2023  1:11 PM EDT ----- Regarding: Request for pre-operative cardiac clearance Request for pre-operative cardiac clearance:  1. What type of surgery is being performed?  INSERTION, TUNNELED CENTRAL VENOUS DEVICE, WITH PORT  2. When is this surgery scheduled?  06/26/2023  3. Type of clearance being requested (medical, pharmacy, both)? MEDICAL   4. Are there any medications that need to be held prior to surgery? N/A - patient to continue daily LOW DOSE ASPIRIN  throughout the perioperative course  5. Practice name and name of physician performing surgery?  Performing surgeon: Dr. [name], MD Requesting clearance: Renate Caroline, FNP-C    6. Anesthesia type (none, local, MAC, general)? GENERAL  7. What is the office phone and fax number?   Phone: [phone] Fax: [phone]  ATTENTION: Unable to create telephone message as per your standard workflow. Directed by [facility] providers to send requests for cardiac clearance to this pool for appropriate distribution to provider covering pre-operative clearances.   Renate Caroline, MSN, APRN, FNP-C, CEN [facility]  Peri-operative Services Nurse Practitioner Phone: [phone] 06/24/23 1:11 PM

## 2023-06-24 NOTE — Patient Instructions (Addendum)
 Your procedure is scheduled on: 06/26/23 - Thursday Report to the Registration Desk on the 1st floor of the [facility]. To find out your arrival time, please call [phone] between 1PM - 3PM on: 06/25/23 - Wednesday If your arrival time is 6:00 am, do not arrive before that time as the [facility] entrance doors do not open until 6:00 am.  REMEMBER: Instructions that are not followed completely may result in serious medical risk, up to and including death; or upon the discretion of your surgeon and anesthesiologist your surgery may need to be rescheduled.  Do not eat food or drink any liquids after midnight the night before surgery.  No gum chewing or hard candies.  One week prior to surgery: Stop Anti-inflammatories (NSAIDS) such as Advil , Aleve, Ibuprofen , Motrin , Naproxen, Naprosyn and Aspirin  based products such as Excedrin, Goody's Powder, BC Powder. You may take Tylenol  if needed for pain up until the day of surgery.  Stop ANY OVER THE COUNTER supplements until after surgery.  HOLD sildenafil (VIAGRA) beginning 06/24/23, may resume after surgery.  HOLD losartan  (COZAAR ) on the day of surgery  ON THE DAY OF SURGERY ONLY TAKE THESE MEDICATIONS WITH SIPS OF WATER:  carbidopa-levodopa diltiazem  (TIAZAC )   pantoprazole  (PROTONIX )  oxyCODONE -acetaminophen  if needed.  Use inhaler albuterol (VENTOLIN HFA)  on the day of surgery and bring to the hospital.  No Alcohol for 24 hours before or after surgery.  No Smoking including e-cigarettes for 24 hours before surgery.  No chewable tobacco products for at least 6 hours before surgery.  No nicotine patches on the day of surgery.  Do not use any "recreational" drugs for at least a week (preferably 2 weeks) before your surgery.  Please be advised that the combination of cocaine and anesthesia may have negative outcomes, up to and including death. If you test positive for cocaine, your surgery will be cancelled.  On the morning  of surgery brush your teeth with toothpaste and water, you may rinse your mouth with mouthwash if you wish. Do not swallow any toothpaste or mouthwash.  Do not wear jewelry, make-up, hairpins, clips or nail polish.  For welded (permanent) jewelry: bracelets, anklets, waist bands, etc.  Please have this removed prior to surgery.  If it is not removed, there is a chance that hospital personnel will need to cut it off on the day of surgery.  Do not wear lotions, powders, or perfumes.   Do not shave body hair from the neck down 48 hours before surgery.  Contact lenses, hearing aids and dentures may not be worn into surgery.  Do not bring valuables to the hospital. [facility] is not responsible for any missing/lost belongings or valuables.   Notify your doctor if there is any change in your medical condition (cold, fever, infection).  Wear comfortable clothing (specific to your surgery type) to the hospital.  After surgery, you can help prevent lung complications by doing breathing exercises.  Take deep breaths and cough every 1-2 hours. Your doctor may order a device called an Incentive Spirometer to help you take deep breaths.  When coughing or sneezing, hold a pillow firmly against your incision with both hands. This is called "splinting." Doing this helps protect your incision. It also decreases belly discomfort.  If you are being admitted to the hospital overnight, leave your suitcase in the car. After surgery it may be brought to your room.  In case of increased patient census, it may be necessary for you, the  patient, to continue your postoperative care in the Same Day Surgery department.  If you are being discharged the day of surgery, you will not be allowed to drive home. You will need a responsible individual to drive you home and stay with you for 24 hours after surgery.   If you are taking public transportation, you will need to have a responsible individual with  you.  Please call the Pre-admissions Testing Dept. at [phone] if you have any questions about these instructions.  Surgery Visitation Policy:  Patients having surgery or a procedure may have two visitors.  Children under the age of 30 must have an adult with them who is not the patient.  Inpatient Visitation:    Visiting hours are 7 a.m. to 8 p.m. Up to four visitors are allowed at one time in a patient room. The visitors may rotate out with other people during the day.  One visitor age 55 or older may stay with the patient overnight and must be in the room by 8 p.m.

## 2023-06-24 NOTE — Telephone Encounter (Signed)
   Pre-operative Risk Assessment    Patient Name: Trevor Flores  [name]: 1960-06-17 MRN: [id number]  Date of last office visit:  Gollan  05-2023  Date of next office visit: Gollan 05-2024   1. What type of surgery is being performed?  INSERTION, TUNNELED CENTRAL VENOUS DEVICE, WITH PORT   2. When is this surgery scheduled?  06/26/2023    3. Type of clearance being requested (medical, pharmacy, both)?  MEDICAL    4. Are there any medications that need to be held prior to surgery?  N/A - patient to continue daily LOW DOSE ASPIRIN  throughout the perioperative course   5. Practice name and name of physician performing surgery?  Performing surgeon: Dr. [name], MD     6. Anesthesia type (none, local, MAC, general)?  GENERAL   7. What is the office phone and fax number?   Phone: [phone]  Fax: [phone]   Signed, Ripley Chen   06/24/2023, 2:36 PM

## 2023-06-24 NOTE — Telephone Encounter (Signed)
   Patient Name: Trevor Flores  [name]: June 22, 1960 MRN: [id number]  Primary Cardiologist: Belva Boyden, MD  Chart reviewed as part of pre-operative protocol coverage.  Patient was last seen in the office on 05/20/2023 by Dr. Gollan.  He was cleared for colonoscopy procedure at that time.  Therefore, given past medical history and time since last visit, based on ACC/AHA guidelines, Trevor Flores is at acceptable risk for the planned procedure without further cardiovascular testing.   I will route this recommendation to the requesting party via Epic fax function and remove from pre-op pool.  Please call with questions.  Jude Norton, NP 06/24/2023, 3:16 PM

## 2023-06-24 NOTE — Progress Notes (Incomplete)
 Perioperative / Anesthesia Services  Pre-Admission Testing Clinical Review / Pre-Operative Anesthesia Consult  Date: 06/24/23  Patient Demographics:  Name: Trevor Flores [name]: [birth year] MRN:   [id number]  Planned Surgical Procedure(s):    Case: [id number] Date/Time: 06/26/23 1145   Procedure: INSERTION, TUNNELED CENTRAL VENOUS DEVICE, WITH PORT (Chest)   Anesthesia type: General   Diagnosis: Adenocarcinoma of colon (HCC) [C18.9]   Pre-op diagnosis: C18.9 Adenocarcinoma of colon   Location: [facility] OR ROOM 06 / [facility]   Surgeons: [name], MD      NOTE: Available PAT nursing documentation and vital signs have been reviewed. Clinical nursing staff has updated patient's PMH/PSHx, current medication list, and drug allergies/intolerances to ensure comprehensive history available to assist in medical decision making as it pertains to the aforementioned surgical procedure and anticipated anesthetic course. Extensive review of available clinical information personally performed. [place] [facility] and PSHx updated with any diagnoses/procedures that  may have been inadvertently omitted during his intake with the pre-admission testing department's nursing staff.  Clinical Discussion:  Trevor Flores is a 63 y.o. male who is submitted for pre-surgical anesthesia review and clearance prior to him undergoing the above procedure. Patient has never been a smoker in the past. 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   Patient is followed by cardiology (Gollan, MD). He was last seen in the cardiology clinic on 05/20/2023; notes reviewed. At the time of his clinic visit, patient doing well overall from a cardiovascular perspective.  Patient  was experiencing mild episodes of intermittent palpitations.  Episodes self-limiting and patient not symptomatic.  Patient denied any chest pain, shortness of breath, PND, orthopnea, significant peripheral edema, weakness, fatigue, vertiginous symptoms, or presyncope/syncope. Patient with a past medical history significant for cardiovascular diagnoses. Documented physical exam was grossly benign, providing no evidence of acute exacerbation and/or decompensation of the patient's known cardiovascular conditions.  Myocardial perfusion imaging study was performed on 04/24/2004 revealing a normal left ventricular systolic function with hyperdynamic LVEF of 75%.  There was no evidence of stress-induced myocardial ischemia or arrhythmia; no scintigraphic evidence of scar.  Patient stressed for a total of 10 minutes and 30 seconds on Bruce protocol achieving a maximum heart rate of 160 bpm.  This was 94% of patient's MPHR.  Patient demonstrated a hypertensive response to exercise; maximum blood pressure 206/103 mmHg.  CT imaging of the head performed on 12/05/2011 revealed a CSF density structure lateral to the RIGHT cerebral peduncle likely reflecting the sequela of the 4 previous lacunar infarction.  Date of neurological insults unknown.  Long-term cardiac event monitor study performed on 12/02/2017 revealing a predominant underlying sinus rhythm with first-degree AV block.  Average heart rate was 55 bpm.  There was rare atrial and ventricular ectopy.  No sustained pauses or arrhythmias noted.  No patient triggered events.  Most recent TTE performed on 04/03/2023 revealed a normal left ventricular systolic function with an EF of 60-65%. There was mild LVH.  There were no regional wall motion abnormalities. Left ventricular diastolic Doppler parameters consistent with abnormal relaxation (G1DD). Right ventricular size and function normal with a TAPSE measuring 2.3 cm  (normal range >/= 1.6 cm). There was mild  tricuspid and aortic valve regurgitation. All transvalvular gradients were noted to be normal providing no evidence suggestive of valvular  stenosis. Aorta normal in size with no evidence of ectasia or aneurysmal dilatation.  Blood pressure well controlled at 120/80 mmHg on currently prescribed CCB (diltiazem ), alpha-blocker (doxazosin ), ARB (losartan ), and beta-blocker (metoprolol  succinate) therapies.  Of note, patient also had been prescribed amlodipine , however he had not been taking this medication.  Cardiology made the decision to discontinue intervention. Patient is on rosuvastatin for his HLD diagnosis and ASCVD prevention. In the setting of known cardiovascular diagnoses, it is important note that patient is on a PDE5i medication (sildenafil) for and erectile dysfunction diagnosis.  Patient not diabetic.  He does not have an OSAH diagnosis. Patient is able to complete all of his  ADL/IADLs without cardiovascular limitation.  Per the [facility]AS[facility], patient is able to achieve at least 4 METS of physical activity without experiencing any significant degree of angina/anginal equivalent symptoms.  No other changes were made to his medication regimen.  Patient to follow-up with outpatient cardiology in 1 year or sooner if needed  Trevor Flores is scheduled for an elective INSERTION, TUNNELED CENTRAL VENOUS DEVICE, WITH PORT (Chest) on 06/26/2023 with Dr. [name], MD.  Given patient's past medical history significant for cardiovascular diagnoses, presurgical cardiac clearance was sought by the PAT team. Per cardiology, "based ACC/AHA guidelines, the patient's past medical history, and the amount of time since his last clinic visit, this patient would be at an overall ACCEPTABLE risk for the planned procedure without further cardiovascular testing or intervention at this time".   In review of the patient's chart, it is noted that he is on daily oral antithrombotic therapy. Given that patient's past  medical history is significant for cardiovascular diagnoses, including but not limited to CAD, general surgery has cleared patient to continue his daily low dose ASA throughout his perioperative course.  Patient has been updated on these directives from his specialty care providers by the PAT team.  Patient denies previous perioperative complications with anesthesia in the past. In review his EMR, it is noted that patient underwent a general anesthetic course here at [facility] ([facility]) in 06/2023 without documented complications.      06/20/2023   11:33 AM 06/20/2023   10:41 AM 06/20/2023   10:39 AM  Vitals with BMI  Systolic 132  144  Diastolic 83  82  Pulse 94 103 147   Providers/Specialists:  NOTE: Primary physician provider listed below. Patient may have been seen by APP or partner within same practice.   PROVIDER ROLE / SPECIALTY LAST Abundio Hoit, MD General Surgery (Surgeon) 06/24/2023  Antonio Baumgarten, MD Primary Care Provider 04/22/2023  Belva Boyden, MD Cardiology 05/20/2023; preop APP call 06/24/2023  [name], MD Medical Oncology 06/17/2023  Alain Aliment, MD Pulmonary Medicine 06/03/2023   Allergies:   Allergies  Allergen Reactions   Other    Hydralazine Palpitations   Current Home Medications:   No current facility-administered medications for this encounter.    albuterol (VENTOLIN HFA) 108 (90 Base) MCG/ACT inhaler   allopurinol (ZYLOPRIM) 100 MG tablet   aspirin  EC 81 MG tablet   carbidopa-levodopa (SINEMET IR) 25-100 MG tablet   diltiazem  (TIAZAC ) 360 MG 24 hr capsule   Docusate Sodium  (DSS) 100 MG CAPS   doxazosin  (CARDURA ) 1 MG tablet   fluticasone (FLONASE) 50 MCG/ACT nasal spray   LINZESS 72 MCG capsule   loratadine (CLARITIN) 10 MG tablet   losartan  (COZAAR ) 100 MG tablet   meclizine  (ANTIVERT ) 25 MG tablet   meloxicam  (MOBIC )  15 MG tablet   methocarbamol  (ROBAXIN ) 500 MG tablet   metoprolol   succinate (TOPROL -XL) 25 MG 24 hr tablet   oxyCODONE -acetaminophen  (PERCOCET) 7.5-325 MG tablet   pantoprazole  (PROTONIX ) 40 MG tablet   Plecanatide (TRULANCE) 3 MG TABS   rosuvastatin (CRESTOR) 5 MG tablet   senna-docusate (SENOKOT-S) 8.6-50 MG tablet   sildenafil (VIAGRA) 25 MG tablet   sucralfate  (CARAFATE ) 1 g tablet   Vitamin D, Ergocalciferol, (DRISDOL) 1.25 MG (50000 UNIT) CAPS capsule   History:   Past Medical History:  Diagnosis Date   Adenocarcinoma of colon metastatic to liver (HCC) 2025   a.) stage IV   Anginal pain (HCC)    Aortic atherosclerosis (HCC)    Chronic kidney disease    Coronary artery disease    DDD (degenerative disc disease), cervical    Diastolic dysfunction    Dyspnea    ED (erectile dysfunction)    a.) on PDE5i (sildenafil) PRN   Generalized osteoarthritis of multiple sites    GERD (gastroesophageal reflux disease)    Heart murmur    a. 12/2011 Echo: EF 60%, no rwma.   Hemiparesis affecting right side as late effect of cerebrovascular accident (CVA) (HCC)    History of stress test    a. 04/2004 Ex MV: Ex time 10:30. Hypertensive response. No ischemia/infarct.   Hyperlipidemia    Hypertension    Long-term use of aspirin  therapy    Palpitations    Parkinson disease (HCC)    Personal history of gout    Possible Lacunar infarction (HCC)    a. 12/2011 Head CT: CSF density lateral to R cerebral peduncle - ? prior lacunar infarct.   Pre-syncope    a. 06/2014 Monitor: Sinus rhythm. No significant arrhythmia.   Resting tremor    Stroke [facility])    Vascular parkinsonism (HCC)    Vertigo    Past Surgical History:  Procedure Laterality Date   APPENDECTOMY     COLONOSCOPY WITH PROPOFOL  N/A 02/04/2023   Procedure: COLONOSCOPY WITH PROPOFOL ;  Surgeon: Toledo, Alphonsus Jeans, MD;  Location: [facility] ENDOSCOPY;  Service: Gastroenterology;  Laterality: N/A;   COLONOSCOPY WITH PROPOFOL  N/A 06/10/2023   Procedure: COLONOSCOPY WITH PROPOFOL ;  Surgeon: Toledo, Alphonsus Jeans, MD;  Location: [facility] ENDOSCOPY;  Service: Gastroenterology;  Laterality: N/A;   ESOPHAGOGASTRODUODENOSCOPY (EGD) WITH PROPOFOL  N/A 02/04/2023   Procedure: ESOPHAGOGASTRODUODENOSCOPY (EGD) WITH PROPOFOL ;  Surgeon: Toledo, Alphonsus Jeans, MD;  Location: [facility] ENDOSCOPY;  Service: Gastroenterology;  Laterality: N/A;   ESOPHAGOGASTRODUODENOSCOPY (EGD) WITH PROPOFOL  N/A 06/10/2023   Procedure: ESOPHAGOGASTRODUODENOSCOPY (EGD) WITH PROPOFOL ;  Surgeon: Toledo, Alphonsus Jeans, MD;  Location: [facility] ENDOSCOPY;  Service: Gastroenterology;  Laterality: N/A;   HEMORRHOID BANDING  ?   Dr Bradford Cadet   POLYPECTOMY  06/10/2023   Procedure: POLYPECTOMY, INTESTINE;  Surgeon: Toledo, Alphonsus Jeans, MD;  Location: [facility] ENDOSCOPY;  Service: Gastroenterology;;   Family History  Problem Relation Age of Onset   Diabetes Brother    Colon cancer Neg Hx    Social History   Tobacco Use   Smoking status: Never   Smokeless tobacco: Former    Types: Chew  Substance Use Topics   Alcohol use: No   Pertinent Clinical Results:  LABS:  Lab Results  Component Value Date   WBC 6.6 06/17/2023   HGB 8.8 (L) 06/17/2023   HCT 28.9 (L) 06/17/2023   MCV 83.3 06/17/2023   PLT 257 06/17/2023   Lab Results  Component Value Date   NA 136 06/17/2023   CL 101 06/17/2023  K 3.7 06/17/2023   CO2 20 (L) 06/17/2023   BUN 20 06/17/2023   CREATININE 1.26 (H) 06/17/2023   GFRNONAA >60 06/17/2023   CALCIUM 8.9 06/17/2023   ALBUMIN 3.8 06/17/2023   GLUCOSE 97 06/17/2023    ECG: Date: 05/20/2023  Time ECG obtained: 1426 PM Rate: 83 bpm Rhythm: normal sinus Axis (leads I and aVF): normal Intervals: PR 192 ms. QRS 78 ms. QTc 531 ms. ST segment and T wave changes: Nonspecific inferior T wave abnormality Evidence of a possible, age undetermined, prior infarct:  No Comparison: Similar to previous tracing obtained on 03/19/2023   IMAGING / PROCEDURES: CT CHEST ABDOMEN PELVIS W CONTRAST performed on 06/12/2023 Large, circumferential mass  of the very redundant mid sigmoid colon, measuring 6.5 cm in length and 4.2 x 4.0 cm in diameter, consistent with primary colonic malignancy. Enlarged lymph nodes or metastatic soft tissue mass in the sigmoid mesocolon as well as an enlarged left iliac lymph node, consistent with nodal metastatic disease. Numerous bulky hypodense liver lesions of varying sizes seen throughout the liver, consistent with hepatic metastatic disease. No evidence of lymphadenopathy or metastatic disease in the chest. Coronary artery disease. Aortic atherosclerosis    TRANSTHORACIC ECHOCARDIOGRAM performed on 04/03/2023 Left ventricular ejection fraction, by estimation, is 60 to 65%. The left ventricle has normal function. The left ventricle has no regional wall motion abnormalities. There is mild left ventricular hypertrophy. Left ventricular diastolic parameters are consistent with Grade I diastolic dysfunction (impaired relaxation).  Right ventricular systolic function is normal. The right ventricular size is normal. Tricuspid regurgitation signal is inadequate for assessing PA pressure.  The mitral valve is normal in structure. No evidence of mitral valve regurgitation. No evidence of mitral stenosis.  The aortic valve is tricuspid. Aortic valve regurgitation is mild. Aortic valve sclerosis is present, with no evidence of aortic valve stenosis.  The inferior vena cava is normal in size with greater than 50% respiratory variability, suggesting right atrial pressure of 3 mmHg.   LONG TERM CARDIAC EVENT MONITOR STUDY performed on  12/02/2017 Average HR of 55 bpm.  Predominant underlying rhythm was Sinus Rhythm.  First degree AV block was present. Isolated SVEs were rare (<1.0%), SVE Couplets were rare (<1.0%), and no SVE Triplets were present.  Isolated VEs were rare (<1.0%), and no VE Couplets or VE Triplets were present.   Impression and Plan:  Trevor Flores has been referred for pre-anesthesia review and  clearance prior to him undergoing the planned anesthetic and procedural courses. Available labs, pertinent testing, and imaging results were personally reviewed by me in preparation for upcoming operative/procedural course. [facility] medical record has been updated following extensive record review and patient interview with PAT staff.   This patient has been appropriately cleared by cardiology with an overall ACCEPTABLE risk of experiencing significant perioperative cardiovascular complications. Based on clinical review performed today (06/24/23), barring any significant acute changes in the patient's overall condition, it is anticipated that he will be able to proceed with the planned surgical intervention. Any acute changes in clinical condition may necessitate his procedure being postponed and/or cancelled. Patient will meet with anesthesia team (MD and/or CRNA) on the day of his procedure for preoperative evaluation/assessment. Questions regarding anesthetic course will be fielded at that time.   Pre-surgical instructions were reviewed with the patient during his PAT appointment, and questions were fielded to satisfaction by PAT clinical staff. He has been instructed on which medications that he will need to hold prior to surgery,  as well as the ones that have been deemed safe/appropriate to take on the day of his procedure. As part of the general education provided by PAT, patient made aware both verbally and in writing, that he would need to abstain from the use of any illegal substances during his perioperative course. He was advised that failure to follow the provided instructions could necessitate case cancellation or result in serious perioperative complications up to and including death. Patient encouraged to contact PAT and/or his surgeon's office to discuss any questions or concerns that may arise prior to surgery; verbalized understanding.   Renate Caroline, MSN, APRN, FNP-C, CEN [facility]  Perioperative Services Nurse Practitioner Phone: [phone] Fax: [phone] 06/24/23 16:50 PM  NOTE: This note has been prepared using Dragon dictation software. Despite my best ability to proofread, there is always the potential that unintentional transcriptional errors may still occur from this process.

## 2023-06-24 NOTE — H&P (View-Only) (Signed)
 History of Present Illness Trevor Flores is a 63 year old male with stage four cancer who presents for evaluation for insertion of a port-a-cath.  He requires a port-a-cath for his upcoming chemotherapy treatment, as recommended by medical oncology, to ensure reliable access for chemotherapy administration and blood draws, minimizing pain and preserving smaller veins.  He has been feeling 'a lot better' recently, although he experiences shortness of breath. A recent scan did not reveal any major pulmonary issues. He also has Parkinson's disease, which may contribute to his fatigue and tiredness.  He is currently on dialysis and uses a numbing cream for his dialysis access, which he inquired about using for the port-a-cath as well.  There was a discussion about dietary restrictions, specifically regarding seafood. He mentioned that he does not eat sushi and has no desire for seafood at this time.      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        PAST SURGICAL HISTORY:   Past Surgical History:  Procedure Laterality Date   Colon @ [facility]  06/10/2023   INVASIVE MODERATELY DIFFERENTIATED COLORECTAL ADENOCARCINOMA/Repeat 6 months/TKT   EGD @ [facility]  06/10/2023   Normal examined EGD/No repeat/TKT   APPENDECTOMY           MEDICATIONS:  Outpatient Encounter Medications as of 06/24/2023  Medication Sig Dispense Refill   albuterol MDI, PROVENTIL, VENTOLIN, PROAIR, HFA 90 mcg/actuation inhaler INHALE 2 INHALATIONS INTO THE LUNGS EVERY 6 HOURS AS NEEDED FOR WHEEZING 18 g 0   allopurinoL (ZYLOPRIM) 100 MG tablet TAKE 1 TABLET(100  MG) BY MOUTH DAILY 90 tablet 1   aspirin  81 MG EC tablet Take 81 mg by mouth once daily     carbidopa-levodopa (SINEMET) 25-100 mg tablet Take 1 tablet by mouth 3 (three) times daily 90 tablet 11   dilTIAZem  (TIAZAC ) 360 MG ER capsule Take 1 capsule (360 mg total) by mouth once daily 90 capsule 1   docusate (COLACE) 100 MG capsule Take 1 capsule (100 mg total) by mouth 2 (two) times daily 60 capsule 3   doxazosin  (CARDURA ) 2 MG tablet Take 2 mg by mouth nightly     ergocalciferol, vitamin D2, 1,250 mcg (50,000 unit) capsule TAKE 1 CAPSULE BY MOUTH 1 TIME A WEEK FOR 8 DOSES 8 capsule 3   ergocalciferol, vitamin D2, 1,250 mcg (50,000 unit) capsule TAKE 1 CAPSULE BY MOUTH 1 TIME A WEEK FOR 8 DOSES 8 capsule 3   loratadine (CLARITIN) 10 mg tablet Take 10 mg by mouth once daily     LORazepam (ATIVAN) 1 MG tablet Take 1 pill 60 min before MRI and 2nd pill 15 min before MRI if needed 2 tablet 0   pantoprazole  (PROTONIX ) 40 MG DR tablet Take 1 tablet (40 mg total) by mouth 2 (two) times daily before meals 60 tablet 3   polyethylene glycol (MIRALAX ) powder Take by mouth once daily     rosuvastatin (CRESTOR) 5 MG tablet TAKE 1 TABLET(5 MG) BY MOUTH DAILY 90 tablet 1   sennosides-docusate (SENOKOT-S) 8.6-50 mg tablet Take 2 tablets by mouth once daily     tiotropium (SPIRIVA) 18 mcg  inhalation capsule Place 1 capsule (18 mcg total) into inhaler and inhale once daily 30 capsule 6   losartan  (COZAAR ) 100 MG tablet Take 1 tablet (100 mg total) by mouth once daily for 180 days 90 tablet 1   metoprolol  succinate (TOPROL -XL) 50 MG XL tablet Take 1 tablet (50 mg total) by mouth once daily (Patient taking differently: Take 25 mg by mouth once daily) 90 tablet 1   sildenafiL (VIAGRA) 25 MG tablet Take 1 tablet (25 mg total) by mouth once daily as needed for Erectile Dysfunction for up to 30 days 30 tablet 0   sodium, potassium, and magnesium (SUPREP) oral solution Take 1 Bottle by mouth as directed One kit contains 2  bottles.  Take both bottles at the times instructed by your provider. (Patient not taking: Reported on 06/24/2023) 354 mL 0   No facility-administered encounter medications on file as of 06/24/2023.     ALLERGIES:   Hydralazine    SOCIAL HISTORY:  Social History   Socioeconomic History   Marital status: Married  Tobacco Use   Smoking status: Former    Types: Cigarettes   Smokeless tobacco: Former    Types: Associate Professor status: Never Used  Substance and Sexual Activity   Alcohol use: Never   Drug use: Defer   Sexual activity: Defer   Social Drivers of Corporate investment banker Strain: Low Risk  (11/05/2022)   Overall Financial Resource Strain (CARDIA)    Difficulty of Paying Living Expenses: Not hard at all  Food Insecurity: No Food Insecurity (06/17/2023)   Received from [facility]   Hunger Vital Sign    Worried About Running Out of Food in the Last Year: Never true    Ran Out of Food in the Last Year: Never true  Transportation Needs: No Transportation Needs (06/17/2023)   Received from [facility] - Transportation    Lack of Transportation (Medical): No    Lack of Transportation (Non-Medical): No    FAMILY HISTORY:  Family History  Problem Relation Name Age of Onset   High blood pressure (Hypertension) Mother     Hyperlipidemia (Elevated cholesterol) Mother     High blood pressure (Hypertension) Father     Tremor Paternal Grandfather       GENERAL REVIEW OF SYSTEMS:   General ROS: negative for - chills, fatigue, fever, weight gain or weight loss Allergy and Immunology ROS: negative for - hives  Hematological and Lymphatic ROS: negative for - bleeding problems or bruising, negative for palpable nodes Endocrine ROS: negative for - heat or cold intolerance, hair changes Respiratory ROS: negative for - cough, positive shortness of breath  Cardiovascular ROS: no chest pain or palpitations GI ROS: negative for nausea, vomiting, abdominal pain,  diarrhea, constipation Musculoskeletal ROS: negative for - joint swelling or muscle pain Neurological ROS: negative for - confusion, syncope Dermatological ROS: negative for pruritus and rash  PHYSICAL EXAM:  Vitals:   06/24/23 1105  BP: 124/79  Pulse: (!) 116  .  Ht:182.9 cm (6') Wt:96.6 kg (213 lb) WJX:BJYN surface area is 2.22 meters squared. Body mass index is 28.89 kg/m.Aaron Aas   GENERAL: Alert, active, oriented x3  HEENT: Pupils equal reactive to light. Extraocular movements are intact. Sclera clear. Palpebral conjunctiva normal red color.Pharynx clear.  NECK: Supple with no palpable mass and no adenopathy.  LUNGS: Sound clear with no rales rhonchi or wheezes.  HEART: Regular rhythm S1 and S2 without murmur.  ABDOMEN:  Soft and depressible, nontender with no palpable mass, no hepatomegaly.   EXTREMITIES: Well-developed well-nourished symmetrical with no dependent edema.  NEUROLOGICAL: Awake alert oriented, facial expression symmetrical, moving all extremities.   Results RADIOLOGY CT scan of the chest abdomen and pelvis shows mass of the sigmoid colon.  Metastatic disease to the liver.  No significant pulmonary pathology.  I personally evaluated the images  I personally evaluated the colonoscopy on June 10, 2023.  Large mass of the sigmoid colon.  Other smaller polyps in the ascending colon.    Assessment & Plan Stage 4 cancer   Stage 4 cancer requires chemotherapy. He was evaluated by medical oncology and advised to proceed with chemotherapy. A port-a-cath insertion is scheduled to facilitate administration. The procedure involves creating a pocket under the skin and tunneling the catheter to a vein near the heart. Benefits include reliable access and reduced pain compared to peripheral veins. Risks such as bleeding, infection, and potential lung collapse were discussed, with ultrasound guidance to mitigate these risks. He understands and is ready to proceed. Proceed with  port-a-cath insertion on Thursday. Administer anesthesia and use ultrasound guidance to minimize lung collapse risk. Provide post-operative care instructions, including normal showering with soap and water, with no need for dressing changes.   Parkinson's disease   Parkinson's disease contributes to fatigue and shortness of breath. No major findings on lung scan to explain shortness of breath, likely related to cancer or Parkinson's disease progression.   Malignant neoplasm of sigmoid colon (CMS/HHS-HCC) [C18.7]          Patient verbalized understanding, all questions were answered, and were agreeable with the plan outlined above.   [name], MD  Electronically signed by [name], MD

## 2023-06-25 ENCOUNTER — Other Ambulatory Visit: Payer: Self-pay | Admitting: Oncology

## 2023-06-25 DIAGNOSIS — C189 Malignant neoplasm of colon, unspecified: Secondary | ICD-10-CM

## 2023-06-25 MED ORDER — PROCHLORPERAZINE MALEATE 10 MG PO TABS: 10.0000 mg | ORAL_TABLET | Freq: Four times a day (QID) | ORAL | 1 refills | Status: DC | PRN

## 2023-06-25 MED ORDER — LIDOCAINE-PRILOCAINE 2.5-2.5 % EX CREA: TOPICAL_CREAM | CUTANEOUS | 3 refills | Status: DC

## 2023-06-25 MED ORDER — ONDANSETRON HCL 8 MG PO TABS: 8.0000 mg | ORAL_TABLET | Freq: Three times a day (TID) | ORAL | 1 refills | Status: DC | PRN

## 2023-06-25 NOTE — Progress Notes (Signed)
START ON PATHWAY REGIMEN - Colorectal     A cycle is every 14 days:     Bevacizumab-xxxx      Oxaliplatin      Leucovorin      Fluorouracil      Fluorouracil   **Always confirm dose/schedule in your pharmacy ordering system**  Patient Characteristics: Distant Metastases, Nonsurgical Candidate, Non-KRAS G12C, RAS Mutation Positive/Unknown (BRAF V600 Wild-Type/Unknown), Standard Cytotoxic Therapy, First Line Standard Cytotoxic Therapy, Bevacizumab Eligible, PS = 0,1 Tumor Location: Colon Therapeutic Status: Distant Metastases Microsatellite/Mismatch Repair Status: Unknown BRAF Mutation Status: Awaiting Test Results KRAS/NRAS Mutation Status: Awaiting Test Results Preferred Therapy Approach: Standard Cytotoxic Therapy Standard Cytotoxic Line of Therapy: First Line Standard Cytotoxic Therapy ECOG Performance Status: 1 Bevacizumab Eligibility: Eligible Intent of Therapy: Non-Curative / Palliative Intent, Discussed with Patient

## 2023-06-26 ENCOUNTER — Encounter
Admission: RE | Disposition: A | Discharge status: Home / Self Care | Payer: Self-pay | Source: Home / Self Care | Attending: General Surgery

## 2023-06-26 ENCOUNTER — Inpatient Hospital Stay

## 2023-06-26 ENCOUNTER — Other Ambulatory Visit: Payer: Self-pay

## 2023-06-26 ENCOUNTER — Ambulatory Visit: Admitting: Urgent Care

## 2023-06-26 ENCOUNTER — Ambulatory Visit

## 2023-06-26 ENCOUNTER — Encounter: Payer: Self-pay | Admitting: General Surgery

## 2023-06-26 ENCOUNTER — Ambulatory Visit
Admission: RE | Admit: 2023-06-26 | Discharge: 2023-06-26 | Disposition: A | Discharge status: Home / Self Care | Attending: General Surgery | Admitting: General Surgery

## 2023-06-26 DIAGNOSIS — I69351 Hemiplegia and hemiparesis following cerebral infarction affecting right dominant side: Secondary | ICD-10-CM | POA: Insufficient documentation

## 2023-06-26 DIAGNOSIS — Z7982 Long term (current) use of aspirin: Secondary | ICD-10-CM | POA: Insufficient documentation

## 2023-06-26 DIAGNOSIS — C187 Malignant neoplasm of sigmoid colon: Secondary | ICD-10-CM | POA: Diagnosis not present

## 2023-06-26 DIAGNOSIS — Z79899 Other long term (current) drug therapy: Secondary | ICD-10-CM | POA: Insufficient documentation

## 2023-06-26 DIAGNOSIS — R5383 Other fatigue: Secondary | ICD-10-CM | POA: Diagnosis not present

## 2023-06-26 DIAGNOSIS — C189 Malignant neoplasm of colon, unspecified: Secondary | ICD-10-CM | POA: Diagnosis present

## 2023-06-26 DIAGNOSIS — G20A1 Parkinson's disease without dyskinesia, without mention of fluctuations: Secondary | ICD-10-CM | POA: Diagnosis not present

## 2023-06-26 DIAGNOSIS — C787 Secondary malignant neoplasm of liver and intrahepatic bile duct: Secondary | ICD-10-CM | POA: Diagnosis not present

## 2023-06-26 DIAGNOSIS — R0602 Shortness of breath: Secondary | ICD-10-CM | POA: Insufficient documentation

## 2023-06-26 HISTORY — DX: Long term (current) use of aspirin: Z79.82

## 2023-06-26 HISTORY — DX: Male erectile dysfunction, unspecified: N52.9

## 2023-06-26 HISTORY — DX: Other cervical disc degeneration, unspecified cervical region: M50.30

## 2023-06-26 HISTORY — DX: Other ill-defined heart diseases: I51.89

## 2023-06-26 HISTORY — DX: Atherosclerosis of aorta: I70.0

## 2023-06-26 HISTORY — PX: PORTACATH PLACEMENT: SHX2246

## 2023-06-26 SURGERY — INSERTION, TUNNELED CENTRAL VENOUS DEVICE, WITH PORT
Anesthesia: General | Site: Chest

## 2023-06-26 MED ORDER — PROPOFOL 500 MG/50ML IV EMUL
INTRAVENOUS | Status: DC | PRN
  Administered 2023-06-26: 50 ug/kg/min via INTRAVENOUS

## 2023-06-26 MED ORDER — HYDROCODONE-ACETAMINOPHEN 5-325 MG PO TABS: 1.0000 | ORAL_TABLET | Freq: Four times a day (QID) | ORAL | 0 refills | Status: DC | PRN

## 2023-06-26 MED ORDER — SODIUM CHLORIDE (PF) 0.9 % IJ SOLN
INTRAMUSCULAR | Status: AC
  Filled 2023-06-26: qty 50

## 2023-06-26 MED ORDER — ACETAMINOPHEN 10 MG/ML IV SOLN
INTRAVENOUS | Status: DC | PRN
  Administered 2023-06-26: 1000 mg via INTRAVENOUS

## 2023-06-26 MED ORDER — LIDOCAINE HCL (PF) 2 % IJ SOLN
INTRAMUSCULAR | Status: DC | PRN
  Administered 2023-06-26: 2 mL via INTRADERMAL

## 2023-06-26 MED ORDER — ACETAMINOPHEN 10 MG/ML IV SOLN: 1000.0000 mg | Freq: Once | INTRAVENOUS | Status: DC | PRN

## 2023-06-26 MED ORDER — BUPIVACAINE-EPINEPHRINE (PF) 0.25% -1:200000 IJ SOLN
INTRAMUSCULAR | Status: DC | PRN
  Administered 2023-06-26: 12 mL

## 2023-06-26 MED ORDER — PROPOFOL 10 MG/ML IV BOLUS
INTRAVENOUS | Status: DC | PRN
  Administered 2023-06-26: 20 mg via INTRAVENOUS
  Administered 2023-06-26: 50 mg via INTRAVENOUS

## 2023-06-26 MED ORDER — MIDAZOLAM HCL 2 MG/2ML IJ SOLN
INTRAMUSCULAR | Status: DC | PRN
  Administered 2023-06-26: 2 mg via INTRAVENOUS

## 2023-06-26 MED ORDER — HEPARIN SODIUM (PORCINE) 5000 UNIT/ML IJ SOLN
INTRAMUSCULAR | Status: AC
  Filled 2023-06-26: qty 1

## 2023-06-26 MED ORDER — CHLORHEXIDINE GLUCONATE 0.12 % MT SOLN
15.0000 mL | Freq: Once | OROMUCOSAL | Status: AC
  Administered 2023-06-26: 15 mL via OROMUCOSAL

## 2023-06-26 MED ORDER — ONDANSETRON HCL 4 MG/2ML IJ SOLN: 4.0000 mg | Freq: Once | INTRAMUSCULAR | Status: DC | PRN

## 2023-06-26 MED ORDER — OXYCODONE HCL 5 MG/5ML PO SOLN: 5.0000 mg | Freq: Once | ORAL | Status: DC | PRN

## 2023-06-26 MED ORDER — MIDAZOLAM HCL 2 MG/2ML IJ SOLN
INTRAMUSCULAR | Status: AC
  Filled 2023-06-26: qty 2

## 2023-06-26 MED ORDER — LACTATED RINGERS IV SOLN: INTRAVENOUS | Status: DC

## 2023-06-26 MED ORDER — CEFAZOLIN SODIUM-DEXTROSE 2-4 GM/100ML-% IV SOLN
INTRAVENOUS | Status: AC
  Filled 2023-06-26: qty 100

## 2023-06-26 MED ORDER — LIDOCAINE HCL (PF) 2 % IJ SOLN
INTRAMUSCULAR | Status: AC
  Filled 2023-06-26: qty 5

## 2023-06-26 MED ORDER — PROPOFOL 10 MG/ML IV BOLUS
INTRAVENOUS | Status: AC
  Filled 2023-06-26: qty 40

## 2023-06-26 MED ORDER — ACETAMINOPHEN 10 MG/ML IV SOLN
INTRAVENOUS | Status: AC
  Filled 2023-06-26: qty 100

## 2023-06-26 MED ORDER — CEFAZOLIN SODIUM-DEXTROSE 2-4 GM/100ML-% IV SOLN
2.0000 g | INTRAVENOUS | Status: AC
  Administered 2023-06-26: 2 g via INTRAVENOUS

## 2023-06-26 MED ORDER — ORAL CARE MOUTH RINSE: 15.0000 mL | Freq: Once | OROMUCOSAL | Status: AC

## 2023-06-26 MED ORDER — SODIUM CHLORIDE 0.9 % IV SOLN
INTRAVENOUS | Status: DC | PRN
  Administered 2023-06-26: 9 mL via INTRAMUSCULAR

## 2023-06-26 MED ORDER — BUPIVACAINE-EPINEPHRINE (PF) 0.25% -1:200000 IJ SOLN
INTRAMUSCULAR | Status: AC
  Filled 2023-06-26: qty 30

## 2023-06-26 MED ORDER — FENTANYL CITRATE (PF) 100 MCG/2ML IJ SOLN
INTRAMUSCULAR | Status: AC
  Filled 2023-06-26: qty 2

## 2023-06-26 MED ORDER — OXYCODONE HCL 5 MG PO TABS: 5.0000 mg | ORAL_TABLET | Freq: Once | ORAL | Status: DC | PRN

## 2023-06-26 MED ORDER — CHLORHEXIDINE GLUCONATE 0.12 % MT SOLN
OROMUCOSAL | Status: AC
  Filled 2023-06-26: qty 15

## 2023-06-26 MED ORDER — SODIUM CHLORIDE (PF) 0.9 % IJ SOLN
INTRAMUSCULAR | Status: DC | PRN
  Administered 2023-06-26: 10 mL via INTRAVENOUS

## 2023-06-26 MED ORDER — FENTANYL CITRATE (PF) 100 MCG/2ML IJ SOLN
INTRAMUSCULAR | Status: DC | PRN
  Administered 2023-06-26 (×2): 50 ug via INTRAVENOUS

## 2023-06-26 MED ORDER — FENTANYL CITRATE (PF) 100 MCG/2ML IJ SOLN: 25.0000 ug | INTRAMUSCULAR | Status: DC | PRN

## 2023-06-26 SURGICAL SUPPLY — 26 items
BAG DECANTER FOR FLEXI CONT (MISCELLANEOUS) ×1 IMPLANT
BLADE SURG 15 STRL LF DISP TIS (BLADE) ×1 IMPLANT
BLADE SURG SZ11 CARB STEEL (BLADE) ×1 IMPLANT
CLAMP SUTURE YELLOW 5 PAIRS (MISCELLANEOUS) ×1 IMPLANT
DERMABOND ADVANCED .7 DNX12 (GAUZE/BANDAGES/DRESSINGS) ×1 IMPLANT
DRAPE C-ARM XRAY 36X54 (DRAPES) ×1 IMPLANT
ELECTRODE REM PT RTRN 9FT ADLT (ELECTROSURGICAL) ×1 IMPLANT
GLOVE BIO SURGEON STRL SZ 6.5 (GLOVE) ×1 IMPLANT
GLOVE BIOGEL PI IND STRL 6.5 (GLOVE) ×1 IMPLANT
GOWN STRL REUS W/ TWL LRG LVL3 (GOWN DISPOSABLE) ×2 IMPLANT
IV NS 500ML BAXH (IV SOLUTION) ×1 IMPLANT
KIT PORT INFUSION SMART 8FR (Port) ×1 IMPLANT
KIT TURNOVER KIT A (KITS) ×1 IMPLANT
LABEL OR SOLS (LABEL) ×1 IMPLANT
MANIFOLD NEPTUNE II (INSTRUMENTS) ×1 IMPLANT
NDL FILTER BLUNT 18X1 1/2 (NEEDLE) ×1 IMPLANT
NEEDLE FILTER BLUNT 18X1 1/2 (NEEDLE) ×1 IMPLANT
PACK PORT-A-CATH (MISCELLANEOUS) ×1 IMPLANT
SUT MNCRL AB 4-0 PS2 18 (SUTURE) ×1 IMPLANT
SUT PROLENE 2 0 FS (SUTURE) ×1 IMPLANT
SUT VIC AB 2-0 SH 27XBRD (SUTURE) ×1 IMPLANT
SUT VIC AB 3-0 SH 27X BRD (SUTURE) ×1 IMPLANT
SYR 10ML LL (SYRINGE) ×2 IMPLANT
SYR 3ML LL SCALE MARK (SYRINGE) ×1 IMPLANT
TRAP FLUID SMOKE EVACUATOR (MISCELLANEOUS) ×1 IMPLANT
WATER STERILE IRR 500ML POUR (IV SOLUTION) ×1 IMPLANT

## 2023-06-26 NOTE — Progress Notes (Addendum)
 Pharmacist Chemotherapy Monitoring - Initial Assessment    Anticipated start date: 07/02/23   The following has been reviewed per standard work regarding the patient's treatment regimen: The patient's diagnosis, treatment plan and drug doses, and organ/hematologic function Lab orders and baseline tests specific to treatment regimen  The treatment plan start date, drug sequencing, and pre-medications Prior authorization status  Patient's documented medication list, including drug-drug interaction screen and prescriptions for anti-emetics and supportive care specific to the treatment regimen The drug concentrations, fluid compatibility, administration routes, and timing of the medications to be used The patient's access for treatment and lifetime cumulative dose history, if applicable  The patient's medication allergies and previous infusion related reactions, if applicable   Changes made to treatment plan:    Follow up needed:     Trevor Flores, [facility], 06/26/2023  3:01 PM

## 2023-06-26 NOTE — Transfer of Care (Signed)
 Immediate Anesthesia Transfer of Care Note  Patient: Trevor Flores  Procedure(s) Performed: INSERTION, TUNNELED CENTRAL VENOUS DEVICE, WITH PORT (Chest)  Patient Location: PACU  Anesthesia Type:General  Level of Consciousness: drowsy and patient cooperative  Airway & Oxygen Therapy: Patient Spontanous Breathing and Patient connected to face mask oxygen  Post-op Assessment: Report given to RN and Post -op Vital signs reviewed and stable  Post vital signs: Reviewed and stable  Last Vitals:  Vitals Value Taken Time  BP 138/86 06/26/23 1255  Temp 37 C 06/26/23 1255  Pulse 90 06/26/23 1300  Resp 16 06/26/23 1300  SpO2 97 % 06/26/23 1300  Vitals shown include unfiled device data.  Last Pain:  Vitals:   06/26/23 1117  TempSrc: Temporal  PainSc: 7          Complications: No notable events documented.

## 2023-06-26 NOTE — Discharge Instructions (Signed)
  Diet: Resume home heart healthy regular diet.   Activity: Increase activity as tolerated.   Wound care: May shower with soapy water and pat dry (do not rub incisions), but no baths or submerging incision underwater until follow-up. (no swimming)   Medications: Resume all home medications. For mild to moderate pain: acetaminophen (Tylenol) or ibuprofen (if no kidney disease). Combining Tylenol with alcohol can substantially increase your risk of causing liver disease. Narcotic pain medications, if prescribed, can be used for severe pain, though may cause nausea, constipation, and drowsiness. Do not combine Tylenol and Norco within a 6 hour period as Norco contains Tylenol. If you do not need the narcotic pain medication, you do not need to fill the prescription.  Call office [phone]) at any time if any questions, worsening pain, fevers/chills, bleeding, drainage from incision site, or other concerns.

## 2023-06-26 NOTE — Op Note (Signed)
 SURGICAL PROCEDURE REPORT  DATE OF PROCEDURE: 06/26/2023   SURGEON: Dr. Dortha Gauss   ANESTHESIA: Local with light IV sedation   PRE-OPERATIVE DIAGNOSIS: Metastatic colon cancer requiring durable central venous access for chemotherapy   POST-OPERATIVE DIAGNOSIS: Same  PROCEDURE(S): (cpt: 36561) 1.) Percutaneous access of Right internal jugular vein under ultrasound guidance 2.) Insertion of tunneled Right internal jugular central venous catheter with subcutaneous port  INTRAOPERATIVE FINDINGS: Patent easily compressible Right internal jugular vein with appropriate respiratory variations and well-secured tunneled central venous catheter with subcutaneous port at completion of the procedure  ESTIMATED BLOOD LOSS: Minimal (<20 mL)   SPECIMENS: None   IMPLANTS: 23F tunneled Bard PowerPort central venous catheter with subcutaneous port  DRAINS: None   COMPLICATIONS: None apparent   CONDITION AT COMPLETION: Hemodynamically stable, awake   DISPOSITION: PACU   INDICATION(S) FOR PROCEDURE:  Patient is a 63 y.o. male who presented with metastatic colon cancer requiring durable central venous access for chemotherapy. All risks, benefits, and alternatives to above elective procedures were discussed with the patient, who elected to proceed, and informed consent was accordingly obtained at that time.  DETAILS OF PROCEDURE:  Patient was brought to the operative suite and appropriately identified. In Trendelenburg position, Right IJ venous access site was prepped and draped in the usual sterile fashion, and following a brief timeout, percutaneous Right IJ venous access was obtained under ultrasound guidance using Seldinger technique, by which local anesthetic was injected over the Right IJ vein, and access needle was inserted under direct ultrasound visualization into the Right IJ vein, through which soft guidewire was advanced, over which access needle was withdrawn. Guidewire was secured,  attention was directed to injection of local anesthetic along the planned tunnel site, 2-3 cm transverse Right chest incision was made and confirmed to accommodate the subcutaneous port, and flushed catheter was tunneled retrograde from the port site over the Right chest to the Right IJ access site with the attached port well-secured to the catheter and within the subcutaneous pocket. Insertion sheath was advanced over the guidewire, which was withdrawn along with the insertion sheath dilator. The catheter was introduced through the sheath and left on the [place] junction under fluoro guidance and catheter cut to desire lenght. Catheter connected to port and fixed to the pocket on two side to avoid twisting. Port was confirmed to withdraw blood and flush easily, after which concentrated heparin  was instilled into the port and catheter. Dermis at the subcutaneous pocket was re-approximated using buried interrupted 3-0 Vicryl suture, and 4-0 Monocryl suture was used to re-approximate skin at the insertion and subcutaneous port sites in running subcuticular fashion for the subcutaneous port and buried interrupted fashion for the insertion site. Skin was cleaned, dried, and sterile skin glue was applied. Patient was then safely transferred to PACU for a chest x-ray. Ultrasound images are available on paper chart and Fluoroscopy guidance images are available in Epic.

## 2023-06-26 NOTE — Interval H&P Note (Signed)
 History and Physical Interval Note:  06/26/2023 11:31 AM  Trevor Flores  has presented today for surgery, with the diagnosis of C18.9 Adenocarcinoma of colon.  The various methods of treatment have been discussed with the patient and family. After consideration of risks, benefits and other options for treatment, the patient has consented to  Procedure(s): INSERTION, TUNNELED CENTRAL VENOUS DEVICE, WITH PORT (N/A) as a surgical intervention.  The patient's history has been reviewed, patient examined, no change in status, stable for surgery.  I have reviewed the patient's chart and labs.  Questions were answered to the patient's satisfaction.     [name]

## 2023-06-27 ENCOUNTER — Other Ambulatory Visit: Payer: Self-pay | Admitting: Oncology

## 2023-06-27 ENCOUNTER — Inpatient Hospital Stay

## 2023-06-27 ENCOUNTER — Other Ambulatory Visit: Payer: Self-pay

## 2023-06-27 ENCOUNTER — Encounter: Payer: Self-pay | Admitting: General Surgery

## 2023-06-27 VITALS — BP 124/75 | HR 80 | Temp 99.2°F | Resp 18

## 2023-06-27 DIAGNOSIS — C189 Malignant neoplasm of colon, unspecified: Secondary | ICD-10-CM

## 2023-06-27 DIAGNOSIS — D500 Iron deficiency anemia secondary to blood loss (chronic): Secondary | ICD-10-CM

## 2023-06-27 DIAGNOSIS — C186 Malignant neoplasm of descending colon: Secondary | ICD-10-CM | POA: Diagnosis not present

## 2023-06-27 MED ORDER — IRON SUCROSE 20 MG/ML IV SOLN
200.0000 mg | Freq: Once | INTRAVENOUS | Status: AC
  Administered 2023-06-27: 200 mg via INTRAVENOUS

## 2023-06-27 MED ORDER — SODIUM CHLORIDE 0.9% FLUSH
10.0000 mL | Freq: Once | INTRAVENOUS | Status: AC | PRN
  Administered 2023-06-27: 10 mL
  Filled 2023-06-27: qty 10

## 2023-06-27 MED ORDER — OXYCODONE HCL 5 MG PO TABS: 5.0000 mg | ORAL_TABLET | Freq: Four times a day (QID) | ORAL | 0 refills | Status: DC | PRN

## 2023-06-29 ENCOUNTER — Inpatient Hospital Stay
Admission: EM | Admit: 2023-06-29 | Discharge: 2023-07-04 | DRG: 164 | Disposition: A | Discharge status: Home Health | Attending: Internal Medicine | Admitting: Internal Medicine

## 2023-06-29 ENCOUNTER — Other Ambulatory Visit: Payer: Self-pay

## 2023-06-29 ENCOUNTER — Emergency Department

## 2023-06-29 DIAGNOSIS — I2699 Other pulmonary embolism without acute cor pulmonale: Principal | ICD-10-CM | POA: Diagnosis present

## 2023-06-29 DIAGNOSIS — R7989 Other specified abnormal findings of blood chemistry: Secondary | ICD-10-CM | POA: Diagnosis present

## 2023-06-29 DIAGNOSIS — N529 Male erectile dysfunction, unspecified: Secondary | ICD-10-CM | POA: Diagnosis present

## 2023-06-29 DIAGNOSIS — G214 Vascular parkinsonism: Secondary | ICD-10-CM | POA: Diagnosis present

## 2023-06-29 DIAGNOSIS — I10 Essential (primary) hypertension: Secondary | ICD-10-CM | POA: Diagnosis present

## 2023-06-29 DIAGNOSIS — I5A Non-ischemic myocardial injury (non-traumatic): Secondary | ICD-10-CM | POA: Diagnosis present

## 2023-06-29 DIAGNOSIS — C189 Malignant neoplasm of colon, unspecified: Secondary | ICD-10-CM | POA: Diagnosis present

## 2023-06-29 DIAGNOSIS — E663 Overweight: Secondary | ICD-10-CM | POA: Diagnosis present

## 2023-06-29 DIAGNOSIS — I700 Atherosclerosis of aorta: Secondary | ICD-10-CM | POA: Diagnosis present

## 2023-06-29 DIAGNOSIS — Z7901 Long term (current) use of anticoagulants: Secondary | ICD-10-CM

## 2023-06-29 DIAGNOSIS — N1831 Chronic kidney disease, stage 3a: Secondary | ICD-10-CM | POA: Diagnosis present

## 2023-06-29 DIAGNOSIS — Z888 Allergy status to other drugs, medicaments and biological substances status: Secondary | ICD-10-CM

## 2023-06-29 DIAGNOSIS — E872 Acidosis, unspecified: Secondary | ICD-10-CM | POA: Diagnosis not present

## 2023-06-29 DIAGNOSIS — R0902 Hypoxemia: Secondary | ICD-10-CM | POA: Diagnosis present

## 2023-06-29 DIAGNOSIS — I2510 Atherosclerotic heart disease of native coronary artery without angina pectoris: Secondary | ICD-10-CM | POA: Diagnosis present

## 2023-06-29 DIAGNOSIS — K219 Gastro-esophageal reflux disease without esophagitis: Secondary | ICD-10-CM | POA: Diagnosis present

## 2023-06-29 DIAGNOSIS — D509 Iron deficiency anemia, unspecified: Secondary | ICD-10-CM | POA: Diagnosis present

## 2023-06-29 DIAGNOSIS — I82513 Chronic embolism and thrombosis of femoral vein, bilateral: Secondary | ICD-10-CM | POA: Diagnosis present

## 2023-06-29 DIAGNOSIS — I129 Hypertensive chronic kidney disease with stage 1 through stage 4 chronic kidney disease, or unspecified chronic kidney disease: Secondary | ICD-10-CM | POA: Diagnosis present

## 2023-06-29 DIAGNOSIS — I2609 Other pulmonary embolism with acute cor pulmonale: Secondary | ICD-10-CM | POA: Diagnosis not present

## 2023-06-29 DIAGNOSIS — Z95828 Presence of other vascular implants and grafts: Secondary | ICD-10-CM

## 2023-06-29 DIAGNOSIS — E785 Hyperlipidemia, unspecified: Secondary | ICD-10-CM | POA: Diagnosis present

## 2023-06-29 DIAGNOSIS — I69351 Hemiplegia and hemiparesis following cerebral infarction affecting right dominant side: Secondary | ICD-10-CM

## 2023-06-29 DIAGNOSIS — Z6825 Body mass index (BMI) 25.0-25.9, adult: Secondary | ICD-10-CM

## 2023-06-29 DIAGNOSIS — M109 Gout, unspecified: Secondary | ICD-10-CM | POA: Diagnosis present

## 2023-06-29 DIAGNOSIS — D631 Anemia in chronic kidney disease: Secondary | ICD-10-CM | POA: Diagnosis present

## 2023-06-29 DIAGNOSIS — Z79899 Other long term (current) drug therapy: Secondary | ICD-10-CM

## 2023-06-29 LAB — CBC WITH DIFFERENTIAL/PLATELET
Abs Immature Granulocytes: 0.09 10*3/uL — ABNORMAL HIGH (ref 0.00–0.07)
Basophils Absolute: 0 10*3/uL (ref 0.0–0.1)
Basophils Relative: 0 %
Eosinophils Absolute: 0 10*3/uL (ref 0.0–0.5)
Eosinophils Relative: 0 %
HCT: 27.5 % — ABNORMAL LOW (ref 39.0–52.0)
Hemoglobin: 8.4 g/dL — ABNORMAL LOW (ref 13.0–17.0)
Immature Granulocytes: 1 %
Lymphocytes Relative: 5 %
Lymphs Abs: 0.5 10*3/uL — ABNORMAL LOW (ref 0.7–4.0)
MCH: 25.6 pg — ABNORMAL LOW (ref 26.0–34.0)
MCHC: 30.5 g/dL (ref 30.0–36.0)
MCV: 83.8 fL (ref 80.0–100.0)
Monocytes Absolute: 0.8 10*3/uL (ref 0.1–1.0)
Monocytes Relative: 8 %
Neutro Abs: 8.3 10*3/uL — ABNORMAL HIGH (ref 1.7–7.7)
Neutrophils Relative %: 86 %
Platelets: 212 10*3/uL (ref 150–400)
RBC: 3.28 MIL/uL — ABNORMAL LOW (ref 4.22–5.81)
RDW: 16.4 % — ABNORMAL HIGH (ref 11.5–15.5)
WBC: 9.7 10*3/uL (ref 4.0–10.5)
nRBC: 0 % (ref 0.0–0.2)

## 2023-06-29 LAB — APTT: aPTT: 33 s (ref 24–36)

## 2023-06-29 LAB — COMPREHENSIVE METABOLIC PANEL WITH GFR
ALT: 17 U/L (ref 0–44)
AST: 55 U/L — ABNORMAL HIGH (ref 15–41)
Albumin: 3.1 g/dL — ABNORMAL LOW (ref 3.5–5.0)
Alkaline Phosphatase: 170 U/L — ABNORMAL HIGH (ref 38–126)
Anion gap: 16 — ABNORMAL HIGH (ref 5–15)
BUN: 22 mg/dL (ref 8–23)
CO2: 18 mmol/L — ABNORMAL LOW (ref 22–32)
Calcium: 8.8 mg/dL — ABNORMAL LOW (ref 8.9–10.3)
Chloride: 102 mmol/L (ref 98–111)
Creatinine, Ser: 1.5 mg/dL — ABNORMAL HIGH (ref 0.61–1.24)
GFR, Estimated: 52 mL/min — ABNORMAL LOW (ref >60)
Glucose, Bld: 102 mg/dL — ABNORMAL HIGH (ref 70–99)
Potassium: 3.8 mmol/L (ref 3.5–5.1)
Sodium: 136 mmol/L (ref 135–145)
Total Bilirubin: 0.6 mg/dL (ref 0.0–1.2)
Total Protein: 8 g/dL (ref 6.5–8.1)

## 2023-06-29 LAB — PROTIME-INR
INR: 1.5 — ABNORMAL HIGH (ref 0.8–1.2)
Prothrombin Time: 18 s — ABNORMAL HIGH (ref 11.4–15.2)

## 2023-06-29 LAB — TYPE AND SCREEN
ABO/RH(D): O POS
Antibody Screen: NEGATIVE

## 2023-06-29 LAB — TROPONIN I (HIGH SENSITIVITY)
Troponin I (High Sensitivity): 173 ng/L (ref <18)
Troponin I (High Sensitivity): 186 ng/L (ref <18)

## 2023-06-29 LAB — BRAIN NATRIURETIC PEPTIDE: B Natriuretic Peptide: 90.3 pg/mL (ref 0.0–100.0)

## 2023-06-29 MED ORDER — HEPARIN BOLUS VIA INFUSION
5000.0000 [IU] | Freq: Once | INTRAVENOUS | Status: AC
  Administered 2023-06-29: 5000 [IU] via INTRAVENOUS
  Filled 2023-06-29: qty 5000

## 2023-06-29 MED ORDER — OXYCODONE HCL 5 MG PO TABS
5.0000 mg | ORAL_TABLET | Freq: Four times a day (QID) | ORAL | Status: DC | PRN
  Administered 2023-06-30: 5 mg via ORAL
  Filled 2023-06-29: qty 1

## 2023-06-29 MED ORDER — HEPARIN (PORCINE) 25000 UT/250ML-% IV SOLN
2250.0000 [IU]/h | INTRAVENOUS | Status: DC
  Administered 2023-06-29: 1500 [IU]/h via INTRAVENOUS
  Administered 2023-06-30: 1750 [IU]/h via INTRAVENOUS
  Administered 2023-07-01: 2150 [IU]/h via INTRAVENOUS
  Administered 2023-07-02: 2250 [IU]/h via INTRAVENOUS
  Filled 2023-06-29 (×5): qty 250

## 2023-06-29 MED ORDER — IOHEXOL 350 MG/ML SOLN
75.0000 mL | Freq: Once | INTRAVENOUS | Status: AC | PRN
  Administered 2023-06-29: 75 mL via INTRAVENOUS

## 2023-06-29 MED ORDER — METOPROLOL SUCCINATE ER 25 MG PO TB24
25.0000 mg | ORAL_TABLET | Freq: Every day | ORAL | Status: DC
  Administered 2023-06-30 – 2023-07-04 (×5): 25 mg via ORAL
  Filled 2023-06-29 (×5): qty 1

## 2023-06-29 MED ORDER — ACETAMINOPHEN 325 MG PO TABS
650.0000 mg | ORAL_TABLET | Freq: Four times a day (QID) | ORAL | Status: DC | PRN
  Administered 2023-07-01 – 2023-07-03 (×4): 650 mg via ORAL
  Filled 2023-06-29 (×2): qty 2

## 2023-06-29 MED ORDER — ALBUTEROL SULFATE (2.5 MG/3ML) 0.083% IN NEBU: 2.5000 mg | INHALATION_SOLUTION | RESPIRATORY_TRACT | Status: DC | PRN

## 2023-06-29 MED ORDER — ONDANSETRON HCL 4 MG/2ML IJ SOLN: 4.0000 mg | Freq: Four times a day (QID) | INTRAMUSCULAR | Status: DC | PRN

## 2023-06-29 MED ORDER — ONDANSETRON HCL 4 MG PO TABS: 4.0000 mg | ORAL_TABLET | Freq: Four times a day (QID) | ORAL | Status: DC | PRN

## 2023-06-29 MED ORDER — ROSUVASTATIN CALCIUM 10 MG PO TABS
5.0000 mg | ORAL_TABLET | Freq: Every day | ORAL | Status: DC
  Administered 2023-06-30 – 2023-07-04 (×5): 5 mg via ORAL
  Filled 2023-06-29 (×5): qty 1

## 2023-06-29 MED ORDER — LOSARTAN POTASSIUM 25 MG PO TABS
25.0000 mg | ORAL_TABLET | Freq: Every day | ORAL | Status: DC
  Administered 2023-06-30: 25 mg via ORAL
  Filled 2023-06-29: qty 1

## 2023-06-29 MED ORDER — ACETAMINOPHEN 650 MG RE SUPP: 650.0000 mg | Freq: Four times a day (QID) | RECTAL | Status: DC | PRN

## 2023-06-29 MED ORDER — PANTOPRAZOLE SODIUM 40 MG PO TBEC
40.0000 mg | DELAYED_RELEASE_TABLET | Freq: Every day | ORAL | Status: DC
  Administered 2023-06-30 – 2023-07-04 (×5): 40 mg via ORAL
  Filled 2023-06-29 (×6): qty 1

## 2023-06-29 NOTE — Assessment & Plan Note (Signed)
 Recently diagnosed Has upcoming liver biopsy 1/29

## 2023-06-29 NOTE — H&P (Signed)
 History and Physical    Patient: Trevor Flores [name]: 31-Dec-1960 DOA: 06/29/2023 DOS: the patient was seen and examined on 06/29/2023 PCP: Antonio Baumgarten, MD  Patient coming from: Home  Chief Complaint:  Chief Complaint  Patient presents with   Shortness of Breath    HPI: Trevor Flores is a 63 y.o. male with medical history significant for CKD, chronic anemia, HTN, recently metastatic diagnosed colon cancer status post recent port placement on 4/24 and with plans for liver biopsy on 4/29, being admitted with bilateral PE after presenting to the ED with a 1 month history of shortness of breath which acutely worsened on the day of arrival with O2 sats at home 90 to 93%.  He endorses lower extremity edema, right greater than left for the past 3 weeks.  Denies chest pain.  Wife at bedside contributes to history.    ED course and data review: Tachypneic to the low 20s and tachycardic to 110 but otherwise normal vitals Labs notable for troponin 173, hemoglobin 8.4 down from 8.8 a couple weeks prior, BNP 90.3, creatinine 1.5 up from 1.26 with bicarb 18, anion gap 16 AST/ALT of 55/17,  EKG, personally viewed and interpreted showing sinus tach at 109 with no acute ST-T wave changes CTA chest bilateral PE without heart strain and known hepatic metastasis Patient placed on a heparin  infusion and hospitalist consulted for admission     Review of Systems: As mentioned in the history of present illness. All other systems reviewed and are negative.  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 12/2011 Echo: EF 60%, no rwma.   Hemiparesis affecting right side as late effect of cerebrovascular accident (CVA) (HCC)    History of stress test    a. 04/2004 Ex MV: Ex time 10:30. Hypertensive response. No ischemia/infarct.   Hyperlipidemia    Hypertension    Long-term use of aspirin  therapy    Palpitations    Parkinson disease (HCC)    Personal history of gout    Possible Lacunar infarction (HCC)    a. 12/2011 Head CT: CSF density lateral to R cerebral peduncle - ? prior lacunar infarct.   Pre-syncope    a. 06/2014 Monitor: Sinus rhythm. No significant arrhythmia.   Resting tremor    Stroke [facility])    Vascular parkinsonism (HCC)    Vertigo    Past Surgical History:  Procedure Laterality Date   APPENDECTOMY     COLONOSCOPY WITH PROPOFOL  N/A 02/04/2023   Procedure: COLONOSCOPY WITH PROPOFOL ;  Surgeon: Toledo, Alphonsus Jeans, MD;  Location: [facility] ENDOSCOPY;  Service: Gastroenterology;  Laterality: N/A;   COLONOSCOPY WITH PROPOFOL  N/A 06/10/2023   Procedure: COLONOSCOPY WITH PROPOFOL ;  Surgeon: Toledo, Alphonsus Jeans, MD;  Location: [facility] ENDOSCOPY;  Service: Gastroenterology;  Laterality: N/A;   ESOPHAGOGASTRODUODENOSCOPY (EGD) WITH PROPOFOL  N/A 02/04/2023   Procedure: ESOPHAGOGASTRODUODENOSCOPY (EGD) WITH PROPOFOL ;  Surgeon: Toledo, Alphonsus Jeans, MD;  Location: [facility] ENDOSCOPY;  Service: Gastroenterology;  Laterality: N/A;  ESOPHAGOGASTRODUODENOSCOPY (EGD) WITH PROPOFOL  N/A 06/10/2023   Procedure: ESOPHAGOGASTRODUODENOSCOPY (EGD) WITH PROPOFOL ;  Surgeon: Toledo, Alphonsus Jeans, MD;  Location: [facility] ENDOSCOPY;  Service: Gastroenterology;  Laterality: N/A;   HEMORRHOID BANDING  ?   Dr Bradford Cadet   POLYPECTOMY  06/10/2023   Procedure: POLYPECTOMY, INTESTINE;  Surgeon: Toledo, Alphonsus Jeans, MD;  Location: [facility] ENDOSCOPY;  Service: Gastroenterology;;   PORTACATH PLACEMENT N/A 06/26/2023   Procedure: INSERTION, TUNNELED CENTRAL VENOUS DEVICE, WITH PORT;  Surgeon: [name], MD;  Location: [facility] ORS;  Service: General;  Laterality: N/A;   Social History:  reports that he has never smoked. He has quit using smokeless tobacco.  His smokeless tobacco use included chew. He reports that he does not drink alcohol and does not use drugs.  Allergies  Allergen Reactions   Other    Hydralazine Palpitations    Family History  Problem Relation Age of Onset   Diabetes Brother    Colon cancer Neg Hx     Prior to Admission medications   Medication Sig Start Date End Date Taking? Authorizing Provider  albuterol (VENTOLIN HFA) 108 (90 Base) MCG/ACT inhaler Inhale 1-2 puffs into the lungs every 4 (four) hours as needed. 03/03/23   [provider]  allopurinol (ZYLOPRIM) 100 MG tablet Take 100 mg by mouth daily. 12/28/18   [provider]  aspirin  EC 81 MG tablet Take 1 tablet (81 mg total) by mouth daily. 06/13/14   [provider]  carbidopa-levodopa (SINEMET IR) 25-100 MG tablet Take 1 tablet by mouth 3 (three) times daily.    [provider]  diltiazem  (TIAZAC ) 360 MG 24 hr capsule Take 1 capsule (360 mg total) by mouth daily. 03/19/23   Furth, Cadence H, PA-C  Docusate Sodium  (DSS) 100 MG CAPS Take 1 capsule by mouth 2 (two) times daily. 04/10/23   [provider]  doxazosin  (CARDURA ) 1 MG tablet TAKE 1 TABLET BY MOUTH DAILY AS NEEDED FOR PRESSURE GREATER THAN 150 12/10/21   Gollan, Timothy J, MD  fluticasone (FLONASE) 50 MCG/ACT nasal spray Place into both nostrils daily as needed for allergies or rhinitis.    [provider]  lidocaine -prilocaine  (EMLA ) cream Apply to affected area once 06/25/23   [name], MD  [facility] 72 MCG capsule Take 72 mcg by mouth every morning. Patient not taking: Reported on 03/19/2023 07/16/20   [provider]  loratadine (CLARITIN) 10 MG tablet Take 10 mg by mouth daily as needed for allergies. Patient not taking: Reported on 03/19/2023    [provider]  losartan   (COZAAR ) 100 MG tablet TAKE 1 TABLET(100 MG) BY MOUTH DAILY IN THE MORNING 04/14/23   Gollan, Timothy J, MD  meclizine  (ANTIVERT ) 25 MG tablet Take 1 tablet (25 mg total) by mouth 3 (three) times daily as needed for dizziness. 08/11/17   Gollan, Timothy J, MD  meloxicam  (MOBIC ) 15 MG tablet TK ONE T PO QD as needed 04/29/21   Carollynn Cirri, NP  methocarbamol  (ROBAXIN ) 500 MG tablet Take 1 tablet (500 mg total) by mouth at bedtime as needed for muscle spasms. Patient not taking: Reported on 03/19/2023 04/29/21   Carollynn Cirri, NP  metoprolol  succinate (TOPROL -XL) 25 MG 24 hr tablet TAKE 1 TABLET(25 MG) BY MOUTH EVERY EVENING WITH FOOD 04/14/23   Gollan, Timothy J, MD  ondansetron  (ZOFRAN ) 8 MG tablet Take 1 tablet (8 mg total) by mouth every 8 (eight) hours as needed for nausea or vomiting. Start on the third day after chemotherapy.  06/25/23   [name], MD  oxyCODONE  (OXY IR/ROXICODONE ) 5 MG immediate release tablet Take 1 tablet (5 mg total) by mouth every 6 (six) hours as needed for severe pain (pain score 7-10) or moderate pain (pain score 4-6). 06/27/23   [name], MD  pantoprazole  (PROTONIX ) 40 MG tablet Take 1 tablet (40 mg total) by mouth daily. 01/26/22 06/26/23  Stafford Eagles, PA-C  Plecanatide (TRULANCE) 3 MG TABS Take 3 mg by mouth as needed. 06/15/20   [provider]  prochlorperazine  (COMPAZINE ) 10 MG tablet Take 1 tablet (10 mg total) by mouth every 6 (six) hours as needed for nausea or vomiting. 06/25/23   [name], MD  rosuvastatin (CRESTOR) 5 MG tablet Take 5 mg by mouth daily. 01/07/19   [provider]  senna-docusate (SENOKOT-S) 8.6-50 MG tablet Take 2 tablets by mouth daily. 06/17/23   [name], MD  sildenafil (VIAGRA) 25 MG tablet Take 25 mg by mouth as needed.    [provider]  Vitamin D, Ergocalciferol, (DRISDOL) 1.25 MG (50000 UNIT) CAPS capsule Take 50,000 Units by mouth once a week. 01/28/23   [provider]    Physical Exam: Vitals:    06/29/23 2019 06/29/23 2020 06/29/23 2140 06/29/23 2300  BP:  136/85  (!) 144/92  Pulse: (!) 110  92 99  Resp: (!) 24  (!) 23 (!) 25  Temp: 97.7 F (36.5 C)   98.7 F (37.1 C)  TempSrc: Oral   Oral  SpO2: 100%  100% 100%  Weight:      Height:       Physical Exam Vitals and nursing note reviewed.  Constitutional:      General: He is not in acute distress.    Comments: Chronically ill-appearing male, mild conversational dyspnea  HENT:     Head: Normocephalic and atraumatic.  Cardiovascular:     Rate and Rhythm: Normal rate and regular rhythm.     Heart sounds: Normal heart sounds.  Pulmonary:     Effort: Tachypnea present.     Breath sounds: Normal breath sounds.  Abdominal:     Palpations: Abdomen is soft.     Tenderness: There is no abdominal tenderness.  Musculoskeletal:     Right lower leg: Edema present.     Left lower leg: Edema present.     Comments: Right greater than left lower extremity edema  Neurological:     Mental Status: Mental status is at baseline.     Labs on Admission: I have personally reviewed following labs and imaging studies  CBC: Recent Labs  Lab 06/29/23 2046  WBC 9.7  NEUTROABS 8.3*  HGB 8.4*  HCT 27.5*  MCV 83.8  PLT 212   Basic Metabolic Panel: Recent Labs  Lab 06/29/23 2046  NA 136  K 3.8  CL 102  CO2 18*  GLUCOSE 102*  BUN 22  CREATININE 1.50*  CALCIUM 8.8*   GFR: Estimated Creatinine Clearance: 56 mL/min (A) (by C-G formula based on SCr of 1.5 mg/dL (H)). Liver Function Tests: Recent Labs  Lab 06/29/23 2046  AST 55*  ALT 17  ALKPHOS 170*  BILITOT 0.6  PROT 8.0  ALBUMIN 3.1*   No results for input(s): "LIPASE", "AMYLASE" in the last 168 hours. No results for input(s): "AMMONIA" in the last 168 hours. Coagulation Profile: Recent Labs  Lab 06/29/23 2046  INR 1.5*   Cardiac Enzymes: No results for input(s): "CKTOTAL", "CKMB", "CKMBINDEX", "TROPONINI" in the last 168 hours. BNP (last 3  results) No results  for input(s): "PROBNP" in the last 8760 hours. HbA1C: No results for input(s): "HGBA1C" in the last 72 hours. CBG: No results for input(s): "GLUCAP" in the last 168 hours. Lipid Profile: No results for input(s): "CHOL", "HDL", "LDLCALC", "TRIG", "CHOLHDL", "LDLDIRECT" in the last 72 hours. Thyroid Function Tests: No results for input(s): "TSH", "T4TOTAL", "FREET4", "T3FREE", "THYROIDAB" in the last 72 hours. Anemia Panel: No results for input(s): "VITAMINB12", "FOLATE", "FERRITIN", "TIBC", "IRON ", "RETICCTPCT" in the last 72 hours. Urine analysis:    Component Value Date/Time   COLORURINE YELLOW (A) 01/26/2022 1012   APPEARANCEUR CLEAR (A) 01/26/2022 1012   APPEARANCEUR Clear 12/05/2011 0955   LABSPEC 1.018 01/26/2022 1012   LABSPEC 1.003 12/05/2011 0955   PHURINE 5.0 01/26/2022 1012   GLUCOSEU NEGATIVE 01/26/2022 1012   GLUCOSEU Negative 12/05/2011 0955   HGBUR SMALL (A) 01/26/2022 1012   BILIRUBINUR NEGATIVE 01/26/2022 1012   BILIRUBINUR Negative 12/05/2011 0955   KETONESUR NEGATIVE 01/26/2022 1012   PROTEINUR 30 (A) 01/26/2022 1012   NITRITE NEGATIVE 01/26/2022 1012   LEUKOCYTESUR NEGATIVE 01/26/2022 1012   LEUKOCYTESUR Negative 12/05/2011 0955    Radiological Exams on Admission: CT Angio Chest PE W and/or Wo Contrast Result Date: 06/29/2023 CLINICAL DATA:  Pulmonary embolism (PE) suspected, high prob. Pt c/o SOB x 1 month, denies cough and fevers. Pt reports recently being dx with colon cancer EXAM: CT ANGIOGRAPHY CHEST WITH CONTRAST TECHNIQUE: Multidetector CT imaging of the chest was performed using the standard protocol during bolus administration of intravenous contrast. Multiplanar CT image reconstructions and MIPs were obtained to evaluate the vascular anatomy. RADIATION DOSE REDUCTION: This exam was performed according to the departmental dose-optimization program which includes automated exposure control, adjustment of the mA and/or kV according to patient size and/or  use of iterative reconstruction technique. CONTRAST:  75mL OMNIPAQUE IOHEXOL 350 MG/ML SOLN COMPARISON:  CT chest abdomen pelvis 06/12/2023 FINDINGS: Cardiovascular: Right chest wall Port-A-Cath with tip terminating within the superior vena cava. Satisfactory opacification of the pulmonary arteries to the segmental level. Bilateral distal central left and right interlobar artery pulmonary emboli extending to the bilateral lower lobes of the segmental and subsegmental levels. Limited evaluation of the subsegmental level due to timing of contrast and motion artifact. Normal heart size. No significant pericardial effusion. The thoracic aorta is normal in caliber. Mild atherosclerotic plaque of the thoracic aorta. At least three-vessel coronary artery calcifications. Mediastinum/Nodes: No enlarged mediastinal, hilar, or axillary lymph nodes. Thyroid gland, trachea, and esophagus demonstrate no significant findings. Lungs/Pleura: Right lower lobe basilar peribronchovascular peripheral ground-glass airspace opacities and slightly more consolidative peripheral consolidation.No pulmonary nodule. No pulmonary mass. No pleural effusion. No pneumothorax. Upper Abdomen: Poorly visualized known hepatic hypodense lesions. Musculoskeletal: No chest wall abnormality. No suspicious lytic or blastic osseous lesions. No acute displaced fracture. Review of the MIP images confirms the above findings. IMPRESSION: 1. Distal central left and right interlobar pulmonary emboli extending to the bilateral lower lobes of the segmental and subsegmental levels. Extensive pulmonary embolus of the right lower lobe. Right lower lobe basilar finding may represent developing pulmonary infarction versus infection. No right heart strain. 2. Poorly visualized known hepatic metastases. These results were called by telephone at the time of interpretation on 06/29/2023 at 10:55 pm to provider [facility] , who verbally acknowledged these results.  Electronically Signed   By: Morgane  Naveau M.D.   On: 06/29/2023 22:57   DG Chest Port 1 View Result Date: 06/29/2023 CLINICAL DATA:  [id number] SOB (shortness of  breath) [id number] EXAM: PORTABLE CHEST 1 VIEW COMPARISON:  Chest x-ray 06/26/2023 FINDINGS: Right chest wall Port-A-Cath with tip overlying the expected region of the distal superior vena cava. The heart and mediastinal contours are unchanged. Atherosclerotic plaque. No focal consolidation. No pulmonary edema. No pleural effusion. No pneumothorax. No acute osseous abnormality. IMPRESSION: 1. No active disease. 2.  Aortic Atherosclerosis (ICD10-I70.0). Electronically Signed   By: Morgane  Naveau M.D.   On: 06/29/2023 21:51   Data Reviewed for HPI: Relevant notes from primary care and specialist visits, past discharge summaries as available in EHR, including Care Everywhere. Prior diagnostic testing as pertinent to current admission diagnoses Updated medications and problem lists for reconciliation ED course, including vitals, labs, imaging, treatment and response to treatment Triage notes, nursing and pharmacy notes and ED provider's notes Notable results as noted above in HPI      Assessment and Plan: * Bilateral pulmonary embolism (HCC) Likely provoked by malignancy Continue heparin  infusion and transition to DOAC when stable  Elevated troponin Suspecting demand ischemia Patient denies chest pain and EKG nonacute Continue to trend Will get echocardiogram  Adenocarcinoma of colon (HCC) Recently diagnosed  Stage 3a chronic kidney disease (HCC) At baseline  IDA (iron  deficiency anemia) Stable at around 8.4 Monitor closely as patient will be on systemic anticoagulation  Essential hypertension Continue losartan  and metoprolol         DVT prophylaxis: Heparin   Consults: Dr Hampton Levins, vascular  Advance Care Planning: full code  Family Communication: Wife at bedside  Disposition Plan: Back to previous home  environment  Severity of Illness: The appropriate patient status for this patient is OBSERVATION. Observation status is judged to be reasonable and necessary in order to provide the required intensity of service to ensure the patient's safety. The patient's presenting symptoms, physical exam findings, and initial radiographic and laboratory data in the context of their medical condition is felt to place them at decreased risk for further clinical deterioration. Furthermore, it is anticipated that the patient will be medically stable for discharge from the hospital within 2 midnights of admission.   Author: Lanetta Pion, MD 06/29/2023 11:29 PM  For on call review [email or web address].

## 2023-06-29 NOTE — ED Triage Notes (Signed)
 Pt c/o SOB x 1 month, denies cough and fevers. Pt reports recently being dx with colon cancer and has noticed an increase in bilateral leg swelling since Thursday.

## 2023-06-29 NOTE — Assessment & Plan Note (Signed)
 At baseline

## 2023-06-29 NOTE — Assessment & Plan Note (Signed)
 Stable at around 8.4 Monitor closely as patient will be on systemic anticoagulation

## 2023-06-29 NOTE — Assessment & Plan Note (Addendum)
 Suspecting demand ischemia Patient denies chest pain and EKG nonacute Continue to trend Will get echocardiogram

## 2023-06-29 NOTE — Assessment & Plan Note (Signed)
 Continue losartan and metoprolol

## 2023-06-29 NOTE — Assessment & Plan Note (Signed)
 Likely provoked by malignancy Continue heparin  infusion and transition to DOAC when stable

## 2023-06-29 NOTE — ED Provider Notes (Signed)
 [facility] Provider Note    Event Date/Time   First MD Initiated Contact with Patient 06/29/23 2017     (approximate)   History   Shortness of Breath   HPI  Trevor Flores is a 63 y.o. male  with known colon cancer, iron  deficient anemia who comes in with concerns for shortness of breath.  Patient's had shortness of breath for the past month.  Denies any cough or fevers.  Does report some increasing leg swelling but is not on any diuretics.  Not on any blood thinners.  He states that today he was more short of breath than normal and family noted oxygen levels were between 90 to 93% which prompted them to come into the emergency room to be evaluated.  Denies any recent black stools, falls or hitting his head   Physical Exam   Triage Vital Signs: ED Triage Vitals  Encounter Vitals Group     BP 06/29/23 2020 136/85     Systolic BP Percentile --      Diastolic BP Percentile --      Pulse Rate 06/29/23 2019 (!) 110     Resp 06/29/23 2019 (!) 24     Temp 06/29/23 2019 97.7 F (36.5 C)     Temp Source 06/29/23 2019 Oral     SpO2 06/29/23 2019 100 %     Weight 06/29/23 2017 205 lb (93 kg)     Height 06/29/23 2017 6' (1.829 m)     Head Circumference --      Peak Flow --      Pain Score 06/29/23 2017 0     Pain Loc --      Pain Education --      Exclude from Growth Chart --     Most recent vital signs: Vitals:   06/29/23 2019 06/29/23 2020  BP:  136/85  Pulse: (!) 110   Resp: (!) 24   Temp: 97.7 F (36.5 C)   SpO2: 100%      General: Awake, no distress.  CV:  Good peripheral perfusion.  Resp:  Normal effort.  Abd:  No distention.  Other:   edema noted bilaterally   ED Results / Procedures / Treatments   Labs (all labs ordered are listed, but only abnormal results are displayed) Labs Reviewed  CBC WITH DIFFERENTIAL/PLATELET - Abnormal; Notable for the following components:      Result Value   RBC 3.28 (*)    Hemoglobin 8.4 (*)     HCT 27.5 (*)    MCH 25.6 (*)    RDW 16.4 (*)    Neutro Abs 8.3 (*)    Lymphs Abs 0.5 (*)    Abs Immature Granulocytes 0.09 (*)    All other components within normal limits  COMPREHENSIVE METABOLIC PANEL WITH GFR - Abnormal; Notable for the following components:   CO2 18 (*)    Glucose, Bld 102 (*)    Creatinine, Ser 1.50 (*)    Calcium 8.8 (*)    Albumin 3.1 (*)    AST 55 (*)    Alkaline Phosphatase 170 (*)    GFR, Estimated 52 (*)    Anion gap 16 (*)    All other components within normal limits  PROTIME-INR - Abnormal; Notable for the following components:   Prothrombin Time 18.0 (*)    INR 1.5 (*)    All other components within normal limits  TROPONIN I (HIGH SENSITIVITY) - Abnormal; Notable for the  following components:   Troponin I (High Sensitivity) 173 (*)    All other components within normal limits  BRAIN NATRIURETIC PEPTIDE  APTT  TYPE AND SCREEN  TROPONIN I (HIGH SENSITIVITY)     EKG  My interpretation of EKG:  Sinus tachycardia rate of 109 without any ST elevation or T wave versions, QTc 512  RADIOLOGY I have reviewed the xray personally and interpreted no edema   PROCEDURES:  Critical Care performed: Yes, see critical care procedure note(s)  .1-3 Lead EKG Interpretation  Performed by: Lubertha Rush, MD Authorized by: Lubertha Rush, MD     Interpretation: normal     ECG rate:  90   ECG rate assessment: normal     Rhythm: sinus rhythm     Ectopy: none     Conduction: normal   .Critical Care  Performed by: Lubertha Rush, MD Authorized by: Lubertha Rush, MD   Critical care provider statement:    Critical care time (minutes):  30   Critical care was necessary to treat or prevent imminent or life-threatening deterioration of the following conditions: PE.   Critical care was time spent personally by me on the following activities:  Development of treatment plan with patient or surrogate, discussions with consultants, evaluation of patient's response  to treatment, examination of patient, ordering and review of laboratory studies, ordering and review of radiographic studies, ordering and performing treatments and interventions, pulse oximetry, re-evaluation of patient's condition and review of old charts    MEDICATIONS ORDERED IN ED: Medications  iohexol (OMNIPAQUE) 350 MG/ML injection 75 mL (75 mLs Intravenous Contrast Given 06/29/23 2234)     IMPRESSION / MDM / ASSESSMENT AND PLAN / ED COURSE  I reviewed the triage vital signs and the nursing notes.   Patient's presentation is most consistent with acute presentation with potential threat to life or bodily function.   Patient comes in with concerns for shortness of breath does have some edema noted on legs.  Differential is CHF, ACS, PE will get CT PE given chest x-ray without any obvious evidence of edema.  BNP is normal.  Patient's hemoglobin is 8.412 days ago it was 8.8.  Patient is currently getting iron  transfusions.  CMP shows creatinine of 1.5.  Will need to admit patient on heparin  and monitor for concerns for possible GI bleed that could develop given his history of colon cancer.  This will be need to be carefully trended.  His troponin is elevated.  No right heart strain to suggest needing vascular intervention emergently.  I will discuss with hospital team for admission due to concern for PE.  He denies any bright red blood per rectum or falls in his head.  The patient is on the cardiac monitor to evaluate for evidence of arrhythmia and/or significant heart rate changes.      FINAL CLINICAL IMPRESSION(S) / ED DIAGNOSES   Final diagnoses:  Other pulmonary embolism without acute cor pulmonale, unspecified chronicity (HCC)  Malignant neoplasm of colon, unspecified part of colon (HCC)     Rx / DC Orders   ED Discharge Orders     None        Note:  This document was prepared using Dragon voice recognition software and may include unintentional dictation errors.    Lubertha Rush, MD 06/29/23 [id number]

## 2023-06-29 NOTE — Progress Notes (Signed)
 PHARMACY - ANTICOAGULATION CONSULT NOTE  Pharmacy Consult for Heparin   Indication: pulmonary embolus  Allergies  Allergen Reactions   Other    Hydralazine Palpitations    Patient Measurements: Height: 6' (182.9 cm) Weight: 93 kg (205 lb) IBW/kg (Calculated) : 77.6 HEPARIN  DW (KG): 93  Vital Signs: Temp: 98.7 F (37.1 C) (04/27 2300) Temp Source: Oral (04/27 2300) BP: 144/92 (04/27 2300) Pulse Rate: 99 (04/27 2300)  Labs: Recent Labs    06/29/23 2046  HGB 8.4*  HCT 27.5*  PLT 212  APTT 33  LABPROT 18.0*  INR 1.5*  CREATININE 1.50*  TROPONINIHS 173*    Estimated Creatinine Clearance: 56 mL/min (A) (by C-G formula based on SCr of 1.5 mg/dL (H)).   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 12/2011 Echo: EF 60%, no rwma.   Hemiparesis affecting right side as late effect of cerebrovascular accident (CVA) (HCC)    History of stress test    a. 04/2004 Ex MV: Ex time 10:30. Hypertensive response. No ischemia/infarct.   Hyperlipidemia    Hypertension    Long-term use of aspirin  therapy    Palpitations    Parkinson disease (HCC)    Personal history of gout    Possible Lacunar infarction (HCC)    a. 12/2011 Head CT: CSF density lateral to R cerebral peduncle - ? prior lacunar infarct.   Pre-syncope    a. 06/2014 Monitor: Sinus rhythm. No significant arrhythmia.   Resting tremor    Stroke [facility])    Vascular parkinsonism (HCC)    Vertigo     Medications:    Assessment: Pharmacy consulted to dose heparin  in this 63  year old male with PE.  No prior anticoag noted. CrCl = 56 ml/min  Goal of Therapy:  Heparin  level 0.3-0.7 units/ml Monitor platelets by anticoagulation protocol: Yes   Plan:  Give 5000 units bolus x 1 Start heparin  infusion at 1500 units/hr Check anti-Xa level in 6 hours and daily while on heparin  Continue to monitor H&H and platelets  Sabatino Williard D 06/29/2023,11:04 PM

## 2023-06-30 ENCOUNTER — Other Ambulatory Visit (HOSPITAL_COMMUNITY): Payer: Self-pay

## 2023-06-30 ENCOUNTER — Inpatient Hospital Stay (HOSPITAL_COMMUNITY)
Admit: 2023-06-30 | Discharge: 2023-06-30 | Disposition: A | Discharge status: Home / Self Care | Attending: Internal Medicine | Admitting: Internal Medicine

## 2023-06-30 ENCOUNTER — Telehealth (HOSPITAL_COMMUNITY): Payer: Self-pay | Admitting: Pharmacy Technician

## 2023-06-30 ENCOUNTER — Observation Stay

## 2023-06-30 ENCOUNTER — Encounter: Payer: Self-pay | Admitting: Internal Medicine

## 2023-06-30 ENCOUNTER — Encounter: Payer: Self-pay | Admitting: Oncology

## 2023-06-30 ENCOUNTER — Other Ambulatory Visit: Payer: Self-pay | Admitting: Oncology

## 2023-06-30 DIAGNOSIS — I2609 Other pulmonary embolism with acute cor pulmonale: Secondary | ICD-10-CM | POA: Diagnosis present

## 2023-06-30 DIAGNOSIS — C189 Malignant neoplasm of colon, unspecified: Secondary | ICD-10-CM | POA: Diagnosis present

## 2023-06-30 DIAGNOSIS — Z6825 Body mass index (BMI) 25.0-25.9, adult: Secondary | ICD-10-CM | POA: Diagnosis not present

## 2023-06-30 DIAGNOSIS — M109 Gout, unspecified: Secondary | ICD-10-CM | POA: Diagnosis present

## 2023-06-30 DIAGNOSIS — I509 Heart failure, unspecified: Secondary | ICD-10-CM

## 2023-06-30 DIAGNOSIS — N1831 Chronic kidney disease, stage 3a: Secondary | ICD-10-CM | POA: Diagnosis present

## 2023-06-30 DIAGNOSIS — R0609 Other forms of dyspnea: Secondary | ICD-10-CM

## 2023-06-30 DIAGNOSIS — I2510 Atherosclerotic heart disease of native coronary artery without angina pectoris: Secondary | ICD-10-CM | POA: Diagnosis present

## 2023-06-30 DIAGNOSIS — E872 Acidosis, unspecified: Secondary | ICD-10-CM | POA: Diagnosis not present

## 2023-06-30 DIAGNOSIS — N529 Male erectile dysfunction, unspecified: Secondary | ICD-10-CM | POA: Diagnosis present

## 2023-06-30 DIAGNOSIS — K219 Gastro-esophageal reflux disease without esophagitis: Secondary | ICD-10-CM | POA: Diagnosis present

## 2023-06-30 DIAGNOSIS — D631 Anemia in chronic kidney disease: Secondary | ICD-10-CM | POA: Diagnosis present

## 2023-06-30 DIAGNOSIS — I82513 Chronic embolism and thrombosis of femoral vein, bilateral: Secondary | ICD-10-CM | POA: Diagnosis present

## 2023-06-30 DIAGNOSIS — E785 Hyperlipidemia, unspecified: Secondary | ICD-10-CM | POA: Diagnosis present

## 2023-06-30 DIAGNOSIS — D500 Iron deficiency anemia secondary to blood loss (chronic): Secondary | ICD-10-CM | POA: Diagnosis not present

## 2023-06-30 DIAGNOSIS — R7989 Other specified abnormal findings of blood chemistry: Secondary | ICD-10-CM | POA: Diagnosis present

## 2023-06-30 DIAGNOSIS — I700 Atherosclerosis of aorta: Secondary | ICD-10-CM | POA: Diagnosis present

## 2023-06-30 DIAGNOSIS — I69351 Hemiplegia and hemiparesis following cerebral infarction affecting right dominant side: Secondary | ICD-10-CM | POA: Diagnosis not present

## 2023-06-30 DIAGNOSIS — Z888 Allergy status to other drugs, medicaments and biological substances status: Secondary | ICD-10-CM | POA: Diagnosis not present

## 2023-06-30 DIAGNOSIS — I10 Essential (primary) hypertension: Secondary | ICD-10-CM | POA: Diagnosis not present

## 2023-06-30 DIAGNOSIS — E663 Overweight: Secondary | ICD-10-CM | POA: Diagnosis present

## 2023-06-30 DIAGNOSIS — I517 Cardiomegaly: Secondary | ICD-10-CM | POA: Diagnosis not present

## 2023-06-30 DIAGNOSIS — Z7901 Long term (current) use of anticoagulants: Secondary | ICD-10-CM | POA: Diagnosis not present

## 2023-06-30 DIAGNOSIS — D509 Iron deficiency anemia, unspecified: Secondary | ICD-10-CM | POA: Diagnosis present

## 2023-06-30 DIAGNOSIS — I2699 Other pulmonary embolism without acute cor pulmonale: Secondary | ICD-10-CM | POA: Diagnosis present

## 2023-06-30 DIAGNOSIS — I129 Hypertensive chronic kidney disease with stage 1 through stage 4 chronic kidney disease, or unspecified chronic kidney disease: Secondary | ICD-10-CM | POA: Diagnosis present

## 2023-06-30 DIAGNOSIS — G214 Vascular parkinsonism: Secondary | ICD-10-CM | POA: Diagnosis present

## 2023-06-30 DIAGNOSIS — Z95828 Presence of other vascular implants and grafts: Secondary | ICD-10-CM | POA: Diagnosis not present

## 2023-06-30 DIAGNOSIS — I82403 Acute embolism and thrombosis of unspecified deep veins of lower extremity, bilateral: Secondary | ICD-10-CM | POA: Diagnosis not present

## 2023-06-30 DIAGNOSIS — Z79899 Other long term (current) drug therapy: Secondary | ICD-10-CM | POA: Diagnosis not present

## 2023-06-30 DIAGNOSIS — I5A Non-ischemic myocardial injury (non-traumatic): Secondary | ICD-10-CM | POA: Diagnosis present

## 2023-06-30 DIAGNOSIS — R0902 Hypoxemia: Secondary | ICD-10-CM | POA: Diagnosis present

## 2023-06-30 LAB — ECHOCARDIOGRAM COMPLETE
AR max vel: 3.06 cm2
AV Area VTI: 3.16 cm2
AV Area mean vel: 2.92 cm2
AV Mean grad: 2.5 mmHg
AV Peak grad: 4.5 mmHg
Ao pk vel: 1.07 m/s
Area-P 1/2: 4.26 cm2
Height: 72 in
S' Lateral: 2.8 cm
Weight: 3280 [oz_av]

## 2023-06-30 LAB — HEPARIN LEVEL (UNFRACTIONATED)
Heparin Unfractionated: 0.3 [IU]/mL (ref 0.30–0.70)
Heparin Unfractionated: 0.19 [IU]/mL — ABNORMAL LOW (ref 0.30–0.70)
Heparin Unfractionated: 0.24 [IU]/mL — ABNORMAL LOW (ref 0.30–0.70)

## 2023-06-30 LAB — HIV ANTIBODY (ROUTINE TESTING W REFLEX): HIV Screen 4th Generation wRfx: NONREACTIVE

## 2023-06-30 MED ORDER — HEPARIN BOLUS VIA INFUSION
2800.0000 [IU] | Freq: Once | INTRAVENOUS | Status: AC
  Administered 2023-06-30: 2800 [IU] via INTRAVENOUS
  Filled 2023-06-30: qty 2800

## 2023-06-30 MED ORDER — DEXTROSE-SODIUM CHLORIDE 5-0.9 % IV SOLN: INTRAVENOUS | Status: AC

## 2023-06-30 MED ORDER — LOSARTAN POTASSIUM 50 MG PO TABS
50.0000 mg | ORAL_TABLET | Freq: Every day | ORAL | Status: DC
  Administered 2023-07-01 – 2023-07-04 (×4): 50 mg via ORAL
  Filled 2023-06-30 (×4): qty 1

## 2023-06-30 MED ORDER — METOPROLOL TARTRATE 5 MG/5ML IV SOLN
5.0000 mg | Freq: Four times a day (QID) | INTRAVENOUS | Status: DC | PRN
  Administered 2023-06-30 – 2023-07-01 (×2): 5 mg via INTRAVENOUS
  Filled 2023-06-30 (×2): qty 5

## 2023-06-30 MED ORDER — HEPARIN BOLUS VIA INFUSION
1400.0000 [IU] | Freq: Once | INTRAVENOUS | Status: AC
  Administered 2023-06-30: 1400 [IU] via INTRAVENOUS
  Filled 2023-06-30: qty 1400

## 2023-06-30 MED ORDER — HYDRALAZINE HCL 20 MG/ML IJ SOLN
10.0000 mg | Freq: Four times a day (QID) | INTRAMUSCULAR | Status: DC | PRN
Start: 2023-06-30 — End: 2023-06-30
  Filled 2023-06-30: qty 1

## 2023-06-30 MED ORDER — AMLODIPINE BESYLATE 10 MG PO TABS
10.0000 mg | ORAL_TABLET | Freq: Every day | ORAL | Status: DC
  Administered 2023-06-30 – 2023-07-01 (×2): 10 mg via ORAL
  Filled 2023-06-30 (×2): qty 1

## 2023-06-30 NOTE — ED Notes (Signed)
 IV team at bedside

## 2023-06-30 NOTE — Progress Notes (Signed)
*  PRELIMINARY RESULTS* Echocardiogram 2D Echocardiogram has been performed.  Trevor Flores 06/30/2023, 1:34 PM

## 2023-06-30 NOTE — Consult Note (Signed)
 Hospital Consult    Reason for Consult:  Pulmonary Embolism Requesting Physician:  DR Brion Cancel MD  MRN #:  [id number]  History of Present Illness: This is a 63 y.o. male with medical history significant for CKD, chronic anemia, HTN, recently metastatic diagnosed colon cancer status post recent port placement on 4/24 and with plans for liver biopsy on 4/29, being admitted with bilateral PE after presenting to the ED with a 1 month history of shortness of breath which acutely worsened on the day of arrival with O2 sats at home 90 to 93%.  He endorses lower extremity edema, right greater than left for the past 3 weeks.  Denies chest pain.  Wife at bedside contributes to history.    Upon workup patient underwent a CTA of the chest which showed bilateral pulmonary embolisms without right heart strain.  Patient also underwent bilateral lower extremity ultrasounds which reveal a right lower extremity thrombus which is occlusive in the femoral vein and a left lower extremity thrombus occlusive in the profundus and calf veins.  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 12/2011 Echo: EF 60%, no rwma.   Hemiparesis affecting right side as late effect of cerebrovascular accident (CVA) (HCC)    History of stress test    a. 04/2004 Ex MV: Ex time 10:30. Hypertensive response. No ischemia/infarct.   Hyperlipidemia    Hypertension    Long-term use of aspirin  therapy    Palpitations    Parkinson disease (HCC)    Personal history  of gout    Possible Lacunar infarction (HCC)    a. 12/2011 Head CT: CSF density lateral to R cerebral peduncle - ? prior lacunar infarct.   Pre-syncope    a. 06/2014 Monitor: Sinus rhythm. No significant arrhythmia.   Resting tremor    Stroke [facility])    Vascular parkinsonism (HCC)    Vertigo     Past Surgical History:  Procedure Laterality Date   APPENDECTOMY     COLONOSCOPY WITH PROPOFOL  N/A 02/04/2023   Procedure: COLONOSCOPY WITH PROPOFOL ;  Surgeon: Toledo, Alphonsus Jeans, MD;  Location: [facility] ENDOSCOPY;  Service: Gastroenterology;  Laterality: N/A;   COLONOSCOPY WITH PROPOFOL  N/A 06/10/2023   Procedure: COLONOSCOPY WITH PROPOFOL ;  Surgeon: Toledo, Alphonsus Jeans, MD;  Location: [facility] ENDOSCOPY;  Service: Gastroenterology;  Laterality: N/A;   ESOPHAGOGASTRODUODENOSCOPY (EGD) WITH PROPOFOL  N/A 02/04/2023   Procedure: ESOPHAGOGASTRODUODENOSCOPY (EGD) WITH PROPOFOL ;  Surgeon: Toledo, Alphonsus Jeans, MD;  Location: [facility] ENDOSCOPY;  Service: Gastroenterology;  Laterality: N/A;   ESOPHAGOGASTRODUODENOSCOPY (EGD) WITH PROPOFOL  N/A 06/10/2023   Procedure: ESOPHAGOGASTRODUODENOSCOPY (EGD) WITH PROPOFOL ;  Surgeon: Toledo, Alphonsus Jeans, MD;  Location: [facility] ENDOSCOPY;  Service: Gastroenterology;  Laterality: N/A;   HEMORRHOID BANDING  ?   Dr Bradford Cadet   POLYPECTOMY  06/10/2023   Procedure: POLYPECTOMY, INTESTINE;  Surgeon: Corky Diener, Alphonsus Jeans, MD;  Location: [facility] ENDOSCOPY;  Service: Gastroenterology;;   [facility] PLACEMENT N/A 06/26/2023   Procedure: INSERTION, TUNNELED CENTRAL  VENOUS DEVICE, WITH PORT;  Surgeon: [name], MD;  Location: [facility] ORS;  Service: General;  Laterality: N/A;    Allergies  Allergen Reactions   Other    Hydralazine Palpitations    Prior to Admission medications   Medication Sig Start Date End Date Taking? Authorizing Provider  albuterol (VENTOLIN HFA) 108 (90 Base) MCG/ACT inhaler Inhale 1-2 puffs into the lungs every 4 (four) hours as needed. 03/03/23  Yes [provider]  allopurinol (ZYLOPRIM) 100 MG tablet Take 100 mg by mouth daily. 12/28/18  Yes [provider]  carbidopa-levodopa (SINEMET IR) 25-100 MG tablet Take 1 tablet by mouth 3 (three) times daily.   Yes [provider]  diltiazem  (TIAZAC ) 360 MG 24 hr capsule Take 1 capsule (360 mg total) by mouth daily. 03/19/23  Yes Furth, Cadence H, PA-C  Docusate Sodium  (DSS) 100 MG CAPS Take 1 capsule by mouth 2 (two) times daily. 04/10/23  Yes [provider]  doxazosin  (CARDURA ) 1 MG tablet TAKE 1 TABLET BY MOUTH DAILY AS NEEDED FOR PRESSURE GREATER THAN 150 12/10/21  Yes Gollan, Timothy J, MD  fluticasone (FLONASE) 50 MCG/ACT nasal spray Place into both nostrils daily as needed for allergies or rhinitis.   Yes [provider]  HYDROcodone -acetaminophen  (NORCO/VICODIN) 5-325 MG tablet Take 1 tablet by mouth 2 (two) times daily as needed. 06/24/23 07/24/23 Yes [provider]  loratadine (CLARITIN) 10 MG tablet Take 10 mg by mouth daily as needed for allergies.   Yes [provider]  losartan  (COZAAR ) 100 MG tablet TAKE 1 TABLET(100 MG) BY MOUTH DAILY IN THE MORNING 04/14/23  Yes Gollan, Timothy J, MD  metoprolol  succinate (TOPROL -XL) 25 MG 24 hr tablet TAKE 1 TABLET(25 MG) BY MOUTH EVERY EVENING WITH FOOD Patient taking differently: Take 12.5 mg by mouth daily. 04/14/23  Yes Gollan, Timothy J, MD  oxyCODONE  (OXY IR/ROXICODONE ) 5 MG immediate release tablet Take 1 tablet (5 mg total) by mouth every 6 (six) hours as needed for severe pain (pain score 7-10) or moderate pain (pain score 4-6). Patient taking differently: Take 2.5 mg by mouth every 6 (six) hours as needed for severe pain (pain score 7-10) or moderate pain (pain score 4-6). 06/27/23  Yes [name], MD  pantoprazole  (PROTONIX ) 40 MG tablet Take 1 tablet (40 mg total) by mouth daily. 01/26/22 06/30/23 Yes Summers, Rhonda L, PA-C  polyethylene glycol powder (GLYCOLAX /MIRALAX ) 17 GM/SCOOP powder Take 17 g by mouth  daily as needed.   Yes [provider]  rosuvastatin (CRESTOR) 5 MG tablet Take 5 mg by mouth daily. 01/07/19  Yes [provider]  senna-docusate (SENOKOT-S) 8.6-50 MG tablet Take 2 tablets by mouth daily. 06/17/23  Yes [name], MD  sildenafil (VIAGRA) 25 MG tablet Take 25 mg by mouth as needed.   Yes [provider]  SPIRIVA HANDIHALER 18 MCG inhalation capsule Place 18 mcg into inhaler and inhale daily.   Yes [provider]  Vitamin D, Ergocalciferol, (DRISDOL) 1.25 MG (50000 UNIT) CAPS capsule Take 50,000 Units by mouth once a week. 01/28/23  Yes [provider]  aspirin  EC 81 MG tablet Take 1 tablet (81 mg total) by mouth daily. Patient not taking: Reported on 06/30/2023 06/13/14   [provider]  lidocaine -prilocaine  (EMLA ) cream Apply to affected area once Patient not taking: Reported on 06/30/2023 06/25/23   [name], MD  meclizine  (ANTIVERT ) 25 MG tablet Take 1 tablet (25 mg total) by mouth 3 (three) times daily as needed  for dizziness. Patient not taking: Reported on 06/30/2023 08/11/17   Gollan, Timothy J, MD  meloxicam  (MOBIC ) 15 MG tablet TK ONE T PO QD as needed Patient not taking: Reported on 06/30/2023 04/29/21   Carollynn Cirri, NP  methocarbamol  (ROBAXIN ) 500 MG tablet Take 1 tablet (500 mg total) by mouth at bedtime as needed for muscle spasms. Patient not taking: Reported on 03/19/2023 04/29/21   Carollynn Cirri, NP  ondansetron  (ZOFRAN ) 8 MG tablet Take 1 tablet (8 mg total) by mouth every 8 (eight) hours as needed for nausea or vomiting. Start on the third day after chemotherapy. Patient not taking: Reported on 06/30/2023 06/25/23   [name], MD  prochlorperazine  (COMPAZINE ) 10 MG tablet Take 1 tablet (10 mg total) by mouth every 6 (six) hours as needed for nausea or vomiting. Patient not taking: Reported on 06/30/2023 06/25/23   [name], MD    Social History   Socioeconomic History   Marital status: Married    Spouse name:  Driskill,Thelma (Spouse)   Number of children: Not on file   Years of education: Not on file   Highest education level: Not on file  Occupational History   Not on file  Tobacco Use   Smoking status: Never   Smokeless tobacco: Former    Types: Designer, multimedia Use   Vaping status: Never Used  Substance and Sexual Activity   Alcohol use: No   Drug use: Never   Sexual activity: Yes    Birth control/protection: None  Other Topics Concern   Not on file  Social History Narrative   Not on file   Social Drivers of Health   Financial Resource Strain: Low Risk  (06/24/2023)   Received from [facility]   Overall Financial Resource Strain (CARDIA)    Difficulty of Paying Living Expenses: Not very hard  Food Insecurity: No Food Insecurity (06/24/2023)   Received from [facility]   Hunger Vital Sign    Worried About Running Out of Food in the Last Year: Never true    Ran Out of Food in the Last Year: Never true  Transportation Needs: No Transportation Needs (06/24/2023)   Received from [facility] - Transportation    In the past 12 months, has lack of transportation kept you from medical appointments or from getting medications?: No    Lack of Transportation (Non-Medical): No  Physical Activity: Not on file  Stress: Not on file  Social Connections: Unknown (08/09/2022)   Received from [facility]   Social Network    Social Network: Not on file  Intimate Partner Violence: Not At Risk (06/17/2023)   Humiliation, Afraid, Rape, and Kick questionnaire    Fear of Current or Ex-Partner: No    Emotionally Abused: No    Physically Abused: No    Sexually Abused: No     Family History  Problem Relation Age of Onset   Diabetes Brother    Colon cancer Neg Hx     ROS: Otherwise negative unless mentioned in HPI  Physical Examination  Vitals:   06/30/23 0615 06/30/23 0630  BP: (!) 178/71 (!) 171/79  Pulse: 89 88  Resp: 18 19   Temp:    SpO2: 99% 98%   Body mass index is 27.8 kg/m.  General:  WDWN in NAD Gait: Not observed HENT: WNL, normocephalic Pulmonary: normal non-labored breathing, without Rales, rhonchi,  wheezing Cardiac: regular, without  Murmurs, rubs or gallops;  without carotid bruits Abdomen: Positive bowel sounds throughout, soft, NT/ND, no masses Skin: without rashes Vascular Exam/Pulses: Palpable upper extremity pulses. Unable to palpate bilateral lower extremity pulses due to edema.  Extremities: without ischemic changes, without Gangrene , without cellulitis; without open wounds;  Musculoskeletal: no muscle wasting or atrophy  Neurologic: A&O X 3;  No focal weakness or paresthesias are detected; speech is fluent/normal Psychiatric:  The pt has Normal affect. Lymph:  Unremarkable  CBC    Component Value Date/Time   WBC 9.7 06/29/2023 2046   RBC 3.28 (L) 06/29/2023 2046   HGB 8.4 (L) 06/29/2023 2046   HGB 15.0 12/05/2011 0955   HCT 27.5 (L) 06/29/2023 2046   HCT 43.2 12/05/2011 0955   PLT 212 06/29/2023 2046   PLT 228 12/05/2011 0955   MCV 83.8 06/29/2023 2046   MCV 84 12/05/2011 0955   MCH 25.6 (L) 06/29/2023 2046   MCHC 30.5 06/29/2023 2046   RDW 16.4 (H) 06/29/2023 2046   RDW 13.8 12/05/2011 0955   LYMPHSABS 0.5 (L) 06/29/2023 2046   MONOABS 0.8 06/29/2023 2046   EOSABS 0.0 06/29/2023 2046   BASOSABS 0.0 06/29/2023 2046    BMET    Component Value Date/Time   NA 136 06/29/2023 2046   NA 139 03/19/2023 0934   NA 142 12/05/2011 0955   K 3.8 06/29/2023 2046   K 3.7 12/05/2011 0955   CL 102 06/29/2023 2046   CL 107 12/05/2011 0955   CO2 18 (L) 06/29/2023 2046   CO2 27 12/05/2011 0955   GLUCOSE 102 (H) 06/29/2023 2046   GLUCOSE 95 12/05/2011 0955   BUN 22 06/29/2023 2046   BUN 16 03/19/2023 0934   BUN 14 12/05/2011 0955   CREATININE 1.50 (H) 06/29/2023 2046   CREATININE 1.38 (H) 12/05/2011 0955   CALCIUM 8.8 (L) 06/29/2023 2046   CALCIUM 9.0 12/05/2011 0955    GFRNONAA 52 (L) 06/29/2023 2046   GFRNONAA 59 (L) 12/05/2011 0955   GFRAA >60 02/22/2019 0633   GFRAA >60 12/05/2011 0955    COAGS: Lab Results  Component Value Date   INR 1.5 (H) 06/29/2023   INR 1.4 (H) 06/17/2023     Non-Invasive Vascular Imaging:   EXAM:06/29/23 CT ANGIOGRAPHY CHEST WITH CONTRAST   TECHNIQUE: Multidetector CT imaging of the chest was performed using the standard protocol during bolus administration of intravenous contrast. Multiplanar CT image reconstructions and MIPs were obtained to evaluate the vascular anatomy.   RADIATION DOSE REDUCTION: This exam was performed according to the departmental dose-optimization program which includes automated exposure control, adjustment of the mA and/or kV according to patient size and/or use of iterative reconstruction technique.   CONTRAST:  75mL OMNIPAQUE IOHEXOL 350 MG/ML SOLN   COMPARISON:  CT chest abdomen pelvis 06/12/2023   FINDINGS: Cardiovascular: Right chest wall Port-A-Cath with tip terminating within the superior vena cava. Satisfactory opacification of the pulmonary arteries to the segmental level. Bilateral distal central left and right interlobar artery pulmonary emboli extending to the bilateral lower lobes of the segmental and subsegmental levels. Limited evaluation of the subsegmental level due to timing of contrast and motion artifact. Normal heart size. No significant pericardial effusion. The thoracic aorta is normal in caliber. Mild atherosclerotic plaque of the thoracic aorta. At least three-vessel coronary artery calcifications.   Mediastinum/Nodes: No enlarged mediastinal, hilar, or axillary lymph nodes. Thyroid gland, trachea, and esophagus demonstrate no significant findings.   Lungs/Pleura: Right lower lobe basilar peribronchovascular peripheral ground-glass airspace opacities and slightly more consolidative  peripheral consolidation.No pulmonary nodule. No pulmonary mass. No  pleural effusion. No pneumothorax.   Upper Abdomen: Poorly visualized known hepatic hypodense lesions.   Musculoskeletal:   No chest wall abnormality.   No suspicious lytic or blastic osseous lesions. No acute displaced fracture.   Review of the MIP images confirms the above findings.   IMPRESSION: 1. Distal central left and right interlobar pulmonary emboli extending to the bilateral lower lobes of the segmental and subsegmental levels. Extensive pulmonary embolus of the right lower lobe. Right lower lobe basilar finding may represent developing pulmonary infarction versus infection. No right heart strain. 2. Poorly visualized known hepatic metastases.   EXAM:06/30/23 BILATERAL LOWER EXTREMITY VENOUS DOPPLER ULTRASOUND   TECHNIQUE: Gray-scale sonography with graded compression, as well as color Doppler and duplex ultrasound were performed to evaluate the lower extremity deep venous systems from the level of the common femoral vein and including the common femoral, femoral, profunda femoral, popliteal and calf veins including the posterior tibial, peroneal and gastrocnemius veins when visible. The superficial great saphenous vein was also interrogated. Spectral Doppler was utilized to evaluate flow at rest and with distal augmentation maneuvers in the common femoral, femoral and popliteal veins.   COMPARISON:  None Available.   FINDINGS: RIGHT LOWER EXTREMITY   Common Femoral Vein: Positive for thrombus.   Saphenofemoral Junction: Positive for thrombus.   Profunda Femoral Vein: Positive for thrombus.   Femoral Vein: Positive for thrombus.   Popliteal Vein: Positive for thrombus.   Calf Veins: Positive for thrombus.   Other Findings:  None.   LEFT LOWER EXTREMITY   Common Femoral Vein: No evidence of thrombus. Normal compressibility, respiratory phasicity and response to augmentation.   Saphenofemoral Junction: No evidence of thrombus. Normal compressibility  and flow on color Doppler imaging.   Profunda Femoral Vein: Positive for thrombus.   Femoral Vein: Positive for thrombus.   Popliteal Vein: No evidence of thrombus. Normal compressibility, respiratory phasicity and response to augmentation.   Calf Veins: Positive for thrombus.   Other Findings:  None.   IMPRESSION: Exam is positive for extensive bilateral lower extremity deep venous thrombosis in this patient with a known history of pulmonary embolus.  ADDENDUM: Regarding the right lower extremity, thrombus appears occlusive in the femoral vein. In the left lower extremity, thrombus appears occlusive in the profunda and calf veins.   Critical Value/emergent results were called by telephone at the time of interpretation on 06/30/2023 at 8:56 am to provider Dr. Mariella Shore, who verbally acknowledged these results.   Statin:  Yes.   Beta Blocker:  Yes.   Aspirin :  Yes.   ACEI:  No. ARB:  Yes.   CCB use:  Yes Other antiplatelets/anticoagulants:  No.    ASSESSMENT/PLAN: This is a 63 y.o. male who presents to [facility] emergency department with a history of 1 month of being short of breath.  This acutely worsened on 06/29/2023.  EMS's arrival shows the patient had an oxygen saturation of 90 to 93% with tachycardia of 110 upon arrival.  Upon workup patient is noted to have bilateral pulmonary embolisms without right heart strain and bilateral lower extremity occlusive thrombosis.  Vascular surgery recommends the patient undergo a pulmonary thrombectomy with IVC filter placement.  I had a long discussion at the bedside this morning with the patient and his wife regarding the procedure, benefits, risk, and complications.  They both verbalized her understanding and wished to proceed to soon as possible.  I answered all their questions this morning.  Patient  was made n.p.o. after midnight the day of the procedure.   -I discussed the case in detail with Dr. Mikki Alexander MD and he agrees with the  plan.   Annamaria Barrette [facility] 06/30/2023 7:21 AM

## 2023-06-30 NOTE — Progress Notes (Signed)
 Progress Note   Patient: Trevor Flores [name]: Feb 10, 1961 DOA: 06/29/2023     0 DOS: the patient was seen and examined on 06/30/2023   Brief hospital course: From HPI "Trevor Flores is a 63 y.o. male with medical history significant for CKD, chronic anemia, HTN, recently metastatic diagnosed colon cancer status post recent port placement on 4/24 and with plans for liver biopsy on 4/29, being admitted with bilateral PE after presenting to the ED with a 1 month history of shortness of breath which acutely worsened on the day of arrival with O2 sats at home 90 to 93%.     Assessment and Plan:  Bilateral pulmonary embolism (HCC) Bilateral lower extremity occlusive DVT Likely provoked by malignancy Continue heparin  infusion and transition to DOAC when stable Vascular surgery consulted and case discussed with Dr. Vonna Guardian   Elevated troponin Suspecting demand ischemia Patient denies chest pain and EKG nonacute Follow-up on echocardiogram   Adenocarcinoma of colon [facility]) Recently diagnosed Outpatient follow-up with oncology  AKI on stage 3a chronic kidney disease (HCC) At baseline Monitor renal function closely Patient received IV fluid  IDA (iron  deficiency anemia) Stable at around 8.4 Monitor closely as patient will be on systemic anticoagulation   Essential hypertension Continue losartan  and metoprolol    DVT prophylaxis: Heparin    Consults:  vascular   Advance Care Planning: full code   Family Communication: Wife at bedside   Disposition Plan: Back to previous home environment  Subjective:  Patient seen and examined at bedside this morning Denies nausea vomiting abdominal pain chest pain He admits to improvement in respiratory function I discussed the case with vascular surgeon Dr. Vonna Guardian concerning patient's bilateral PE as well as bilateral occlusive DVT  Physical Exam: Vitals and nursing note reviewed.  Constitutional:      General: He is not in acute  distress.    Comments: Chronically ill-appearing male HENT:     Head: Normocephalic and atraumatic.  Cardiovascular:     Rate and Rhythm: Normal rate and regular rhythm.     Heart sounds: Normal heart sounds.  Pulmonary:     Effort: Tachypnea present.     Breath sounds: Normal breath sounds.  Abdominal:     Palpations: Abdomen is soft.     Tenderness: There is no abdominal tenderness.  Musculoskeletal:     Right lower leg: Edema present.     Left lower leg: Edema present.     Comments: Right greater than left lower extremity edema  Neurological:     Mental Status: Mental status is at baseline.   Vitals:   06/30/23 1138 06/30/23 1300 06/30/23 1355 06/30/23 1659  BP:  (!) 184/92 (!) 170/95 (!) 196/94  Pulse:  94 94 100  Resp:  (!) 25 18   Temp: 97.7 F (36.5 C)   98.1 F (36.7 C)  TempSrc: Oral   Oral  SpO2:  100% 97% 100%  Weight:      Height:        Data Reviewed: Bilateral lower extremity DVT noted as well as extensive PE on CT angio of the chest    Latest Ref Rng & Units 06/29/2023    8:46 PM 06/17/2023    4:19 PM 02/28/2023   11:23 AM  CBC  WBC 4.0 - 10.5 K/uL 9.7  6.6  5.3   Hemoglobin 13.0 - 17.0 g/dL 8.4  8.8  82.9   Hematocrit 39.0 - 52.0 % 27.5  28.9  35.8   Platelets 150 -  400 K/uL 212  257  224        Latest Ref Rng & Units 06/29/2023    8:46 PM 06/17/2023    4:19 PM 03/19/2023    9:34 AM  BMP  Glucose 70 - 99 mg/dL 161  97  98   BUN 8 - 23 mg/dL 22  20  16    Creatinine 0.61 - 1.24 mg/dL 0.96  0.45  4.09   BUN/Creat Ratio 10 - 24   12   Sodium 135 - 145 mmol/L 136  136  139   Potassium 3.5 - 5.1 mmol/L 3.8  3.7  3.5   Chloride 98 - 111 mmol/L 102  101  99   CO2 22 - 32 mmol/L 18  20  22    Calcium 8.9 - 10.3 mg/dL 8.8  8.9  9.2      Time spent: 55 minutes  Author: Ezzard Holms, MD 06/30/2023 5:22 PM  For on call review [email or web address].

## 2023-06-30 NOTE — Progress Notes (Signed)
 Pt BP elevated, Djan MD notified. Hydralazine prn ordered. Checked with pharmacy for guidance due to allergy listed. Pt refused hydralazine, states he wants his normal BP meds and hydralazine makes him itch. Djan MD notified.

## 2023-06-30 NOTE — Progress Notes (Signed)
 PHARMACY - ANTICOAGULATION CONSULT NOTE  Pharmacy Consult for Heparin   Indication: pulmonary embolus  Allergies  Allergen Reactions   Other    Hydralazine Palpitations    Patient Measurements: Height: 6' (182.9 cm) Weight: 93 kg (205 lb) IBW/kg (Calculated) : 77.6 HEPARIN  DW (KG): 93  Vital Signs: Temp: 98.7 F (37.1 C) (04/28 0359) Temp Source: Oral (04/28 0359) BP: 140/78 (04/28 0200) Pulse Rate: 97 (04/28 0324)  Labs: Recent Labs    06/29/23 2046 06/29/23 2300 06/30/23 0417  HGB 8.4*  --   --   HCT 27.5*  --   --   PLT 212  --   --   APTT 33  --   --   LABPROT 18.0*  --   --   INR 1.5*  --   --   HEPARINUNFRC  --   --  0.30  CREATININE 1.50*  --   --   TROPONINIHS 173* 186*  --     Estimated Creatinine Clearance: 56 mL/min (A) (by C-G formula based on SCr of 1.5 mg/dL (H)).   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 12/2011 Echo: EF 60%, no rwma.   Hemiparesis affecting right side as late effect of cerebrovascular accident (CVA) (HCC)    History of stress test    a. 04/2004 Ex MV: Ex time 10:30. Hypertensive response. No ischemia/infarct.   Hyperlipidemia    Hypertension    Long-term use of aspirin  therapy    Palpitations    Parkinson disease (HCC)    Personal history of gout    Possible Lacunar infarction (HCC)    a. 12/2011 Head CT: CSF density lateral to R cerebral peduncle - ? prior lacunar infarct.   Pre-syncope    a. 06/2014 Monitor: Sinus rhythm. No significant arrhythmia.   Resting tremor     Stroke [facility])    Vascular parkinsonism (HCC)    Vertigo     Medications:    Assessment: Pharmacy consulted to dose heparin  in this 63 year old male with PE.  No prior anticoag noted. CrCl = 56 ml/min  Goal of Therapy:  Heparin  level 0.3-0.7 units/ml Monitor platelets by anticoagulation protocol: Yes   Plan:  4/28:  HL @ 0417 = 0.30, therapeutic X 1 - will continue pt on current rate and recheck HL in 6 hrs @ 1000.  - CBC daily  Timmey Lamba D 06/30/2023,4:50 AM

## 2023-06-30 NOTE — ED Notes (Signed)
 Staff constantly in room in response to pts call bell. Pt repositioned x4. Brief changed. Pt continues to express discomfort. Wife remains at bedside.

## 2023-06-30 NOTE — ED Notes (Signed)
 CCMD called to initiate cardiac monitoring

## 2023-06-30 NOTE — Telephone Encounter (Signed)
 Patient Product/process development scientist completed.    The patient is insured through [facility]. Patient has Medicare and is not eligible for a copay card, but may be able to apply for patient assistance or Medicare RX Payment Plan (Patient Must reach out to their plan, if eligible for payment plan), if available.    Ran test claim for Eliquis Starter Pack and the current 30 day co-pay is $47.00.   This test claim was processed through [facility]- copay amounts may vary at other pharmacies due to pharmacy/plan contracts, or as the patient moves through the different stages of their insurance plan.     Morgan Arab, CPHT Pharmacy Technician III Certified Patient Advocate [facility] Pharmacy Patient Advocate Team Direct Number: [phone]  Fax: [phone]

## 2023-06-30 NOTE — Progress Notes (Signed)
 PHARMACY - ANTICOAGULATION CONSULT NOTE  Pharmacy Consult for Heparin   Indication: pulmonary embolus  Allergies  Allergen Reactions   Other    Hydralazine Itching and Palpitations    Patient Measurements: Height: 6' (182.9 cm) Weight: 93 kg (205 lb) IBW/kg (Calculated) : 77.6 HEPARIN  DW (KG): 93  Vital Signs: Temp: 97.7 F (36.5 C) (04/28 1953) Temp Source: Oral (04/28 1659) BP: 165/93 (04/28 1953) Pulse Rate: 92 (04/28 1953)  Labs: Recent Labs    06/29/23 2046 06/29/23 2300 06/30/23 0417 06/30/23 1003 06/30/23 1928  HGB 8.4*  --   --   --   --   HCT 27.5*  --   --   --   --   PLT 212  --   --   --   --   APTT 33  --   --   --   --   LABPROT 18.0*  --   --   --   --   INR 1.5*  --   --   --   --   HEPARINUNFRC  --   --  0.30 0.19* 0.24*  CREATININE 1.50*  --   --   --   --   TROPONINIHS 173* 186*  --   --   --     Estimated Creatinine Clearance: 56 mL/min (A) (by C-G formula based on SCr of 1.5 mg/dL (H)).   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 12/2011 Echo: EF 60%, no rwma.   Hemiparesis affecting right side as late effect of cerebrovascular accident (CVA) (HCC)    History of stress test    a. 04/2004 Ex MV: Ex time 10:30. Hypertensive response. No ischemia/infarct.   Hyperlipidemia    Hypertension    Long-term use of aspirin  therapy    Palpitations    Parkinson disease (HCC)    Personal history of gout    Possible Lacunar infarction (HCC)    a. 12/2011 Head CT: CSF density lateral to R cerebral  peduncle - ? prior lacunar infarct.   Pre-syncope    a. 06/2014 Monitor: Sinus rhythm. No significant arrhythmia.   Resting tremor    Stroke [facility])    Vascular parkinsonism (HCC)    Vertigo     Medications:    Assessment: Pharmacy consulted to dose heparin  in this 63 year old male with PE.  No prior anticoag noted. CrCl = 56 ml/min  4/28:  HL @ 0417 = 0.30, therapeutic X 1 4/28:  HL @ 1003 = 0.19, subtherapeutic  4/28:  HL @ 1928 = 0.24, subtherapeutic  Goal of Therapy:  Heparin  level 0.3-0.7 units/ml Monitor platelets by anticoagulation protocol: Yes   Plan:  - give 1400 unit bolus x 1 - increase heparin  infusion to a rate of 1950 units/hr - check heparin  level 6 hours after rate change - monitor CBC daily while on heparin     Amaan Meyer A Abshir Paolini, PharmD Clinical Pharmacist  06/30/2023,8:03 PM

## 2023-06-30 NOTE — Progress Notes (Signed)
 PHARMACY - ANTICOAGULATION CONSULT NOTE  Pharmacy Consult for Heparin   Indication: pulmonary embolus  Allergies  Allergen Reactions   Other    Hydralazine Palpitations    Patient Measurements: Height: 6' (182.9 cm) Weight: 93 kg (205 lb) IBW/kg (Calculated) : 77.6 HEPARIN  DW (KG): 93  Vital Signs: Temp: 98.7 F (37.1 C) (04/28 0817) Temp Source: Oral (04/28 0817) BP: 193/101 (04/28 1000) Pulse Rate: 95 (04/28 1000)  Labs: Recent Labs    06/29/23 2046 06/29/23 2300 06/30/23 0417 06/30/23 1003  HGB 8.4*  --   --   --   HCT 27.5*  --   --   --   PLT 212  --   --   --   APTT 33  --   --   --   LABPROT 18.0*  --   --   --   INR 1.5*  --   --   --   HEPARINUNFRC  --   --  0.30 0.19*  CREATININE 1.50*  --   --   --   TROPONINIHS 173* 186*  --   --     Estimated Creatinine Clearance: 56 mL/min (A) (by C-G formula based on SCr of 1.5 mg/dL (H)).   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 12/2011 Echo: EF 60%, no rwma.   Hemiparesis affecting right side as late effect of cerebrovascular accident (CVA) (HCC)    History of stress test    a. 04/2004 Ex MV: Ex time 10:30. Hypertensive response. No ischemia/infarct.   Hyperlipidemia    Hypertension    Long-term use of aspirin  therapy    Palpitations    Parkinson disease (HCC)    Personal history of gout    Possible Lacunar infarction (HCC)    a. 12/2011 Head CT: CSF density lateral to R cerebral peduncle - ? prior lacunar infarct.   Pre-syncope    a. 06/2014 Monitor:  Sinus rhythm. No significant arrhythmia.   Resting tremor    Stroke [facility])    Vascular parkinsonism (HCC)    Vertigo     Medications:    Assessment: Pharmacy consulted to dose heparin  in this 63 year old male with PE.  No prior anticoag noted. CrCl = 56 ml/min  4/28:  HL @ 0417 = 0.30, therapeutic X 1 4/28:  HL @ 1003 = 0.19, subtherapeutic   Goal of Therapy:  Heparin  level 0.3-0.7 units/ml Monitor platelets by anticoagulation protocol: Yes   Plan:  4/28:  HL @ 1003 = 0.19, subtherapeutic  - give 2800 unit bolus x 1 - increase heparin  infusion to a rate of 1750 units/hr - check heparin  level 6 hours after rate change - monitor CBC daily while on heparin     Alice Innocent, PharmD Clinical Pharmacist  06/30/2023,10:56 AM

## 2023-07-01 ENCOUNTER — Ambulatory Visit
Admission: RE | Admit: 2023-07-01 | Discharge: 2023-07-01 | Disposition: A | Discharge status: Home / Self Care | Source: Ambulatory Visit | Attending: Oncology | Admitting: Oncology

## 2023-07-01 DIAGNOSIS — I2699 Other pulmonary embolism without acute cor pulmonale: Secondary | ICD-10-CM | POA: Diagnosis not present

## 2023-07-01 DIAGNOSIS — I10 Essential (primary) hypertension: Secondary | ICD-10-CM | POA: Diagnosis not present

## 2023-07-01 DIAGNOSIS — R7989 Other specified abnormal findings of blood chemistry: Secondary | ICD-10-CM | POA: Diagnosis not present

## 2023-07-01 LAB — CBC
HCT: 24.1 % — ABNORMAL LOW (ref 39.0–52.0)
Hemoglobin: 7.5 g/dL — ABNORMAL LOW (ref 13.0–17.0)
MCH: 25.2 pg — ABNORMAL LOW (ref 26.0–34.0)
MCHC: 31.1 g/dL (ref 30.0–36.0)
MCV: 80.9 fL (ref 80.0–100.0)
Platelets: 276 10*3/uL (ref 150–400)
RBC: 2.98 MIL/uL — ABNORMAL LOW (ref 4.22–5.81)
RDW: 16.8 % — ABNORMAL HIGH (ref 11.5–15.5)
WBC: 11 10*3/uL — ABNORMAL HIGH (ref 4.0–10.5)
nRBC: 0 % (ref 0.0–0.2)

## 2023-07-01 LAB — BASIC METABOLIC PANEL WITH GFR
Anion gap: 13 (ref 5–15)
BUN: 20 mg/dL (ref 8–23)
CO2: 19 mmol/L — ABNORMAL LOW (ref 22–32)
Calcium: 8.6 mg/dL — ABNORMAL LOW (ref 8.9–10.3)
Chloride: 107 mmol/L (ref 98–111)
Creatinine, Ser: 1.18 mg/dL (ref 0.61–1.24)
GFR, Estimated: >60 mL/min (ref >60)
Glucose, Bld: 85 mg/dL (ref 70–99)
Potassium: 3.7 mmol/L (ref 3.5–5.1)
Sodium: 139 mmol/L (ref 135–145)

## 2023-07-01 LAB — HEPARIN LEVEL (UNFRACTIONATED)
Heparin Unfractionated: 0.29 [IU]/mL — ABNORMAL LOW (ref 0.30–0.70)
Heparin Unfractionated: 0.39 [IU]/mL (ref 0.30–0.70)
Heparin Unfractionated: 0.45 [IU]/mL (ref 0.30–0.70)

## 2023-07-01 MED ORDER — DILTIAZEM HCL ER COATED BEADS 180 MG PO CP24
360.0000 mg | ORAL_CAPSULE | Freq: Every day | ORAL | Status: DC
  Administered 2023-07-01 – 2023-07-04 (×4): 360 mg via ORAL
  Filled 2023-07-01 (×4): qty 2

## 2023-07-01 MED ORDER — CARBIDOPA-LEVODOPA 25-100 MG PO TABS
1.0000 | ORAL_TABLET | Freq: Three times a day (TID) | ORAL | Status: DC
  Administered 2023-07-01 – 2023-07-04 (×9): 1 via ORAL
  Filled 2023-07-01 (×9): qty 1

## 2023-07-01 MED ORDER — HEPARIN BOLUS VIA INFUSION
1400.0000 [IU] | Freq: Once | INTRAVENOUS | Status: AC
  Administered 2023-07-01: 1400 [IU] via INTRAVENOUS
  Filled 2023-07-01: qty 1400

## 2023-07-01 NOTE — Plan of Care (Signed)
  Problem: Education: Goal: Knowledge of General Education information will improve Description: Including pain rating scale, medication(s)/side effects and non-pharmacologic comfort measures Outcome: Progressing   Problem: Clinical Measurements: Goal: Ability to maintain clinical measurements within normal limits will improve Outcome: Progressing Goal: Respiratory complications will improve Outcome: Progressing   Problem: Activity: Goal: Risk for activity intolerance will decrease Outcome: Progressing   

## 2023-07-01 NOTE — Progress Notes (Signed)
 PHARMACY - ANTICOAGULATION CONSULT NOTE  Pharmacy Consult for Heparin   Indication: pulmonary embolus  Allergies  Allergen Reactions   Other    Hydralazine Itching and Palpitations    Patient Measurements: Height: 6' (182.9 cm) Weight: 93 kg (205 lb) IBW/kg (Calculated) : 77.6 HEPARIN  DW (KG): 93  Vital Signs: Temp: 97.5 F (36.4 C) (04/29 0003) Temp Source: Oral (04/28 1659) BP: 177/87 (04/29 0003) Pulse Rate: 106 (04/29 0003)  Labs: Recent Labs    06/29/23 2046 06/29/23 2300 06/30/23 0417 06/30/23 1003 06/30/23 1928 07/01/23 0234  HGB 8.4*  --   --   --   --  7.5*  HCT 27.5*  --   --   --   --  24.1*  PLT 212  --   --   --   --  276  APTT 33  --   --   --   --   --   LABPROT 18.0*  --   --   --   --   --   INR 1.5*  --   --   --   --   --   HEPARINUNFRC  --   --    < > 0.19* 0.24* 0.29*  CREATININE 1.50*  --   --   --   --   --   TROPONINIHS 173* 186*  --   --   --   --    < > = values in this interval not displayed.    Estimated Creatinine Clearance: 56 mL/min (A) (by C-G formula based on SCr of 1.5 mg/dL (H)).   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 12/2011 Echo: EF 60%, no rwma.   Hemiparesis affecting right side as late effect of cerebrovascular accident (CVA) (HCC)    History of stress test    a. 04/2004 Ex MV: Ex time 10:30. Hypertensive response. No ischemia/infarct.   Hyperlipidemia    Hypertension    Long-term use of aspirin  therapy    Palpitations    Parkinson disease (HCC)    Personal history  of gout    Possible Lacunar infarction (HCC)    a. 12/2011 Head CT: CSF density lateral to R cerebral peduncle - ? prior lacunar infarct.   Pre-syncope    a. 06/2014 Monitor: Sinus rhythm. No significant arrhythmia.   Resting tremor    Stroke [facility])    Vascular parkinsonism (HCC)    Vertigo     Medications:    Assessment: Pharmacy consulted to dose heparin  in this 63 year old male with PE.  No prior anticoag noted. CrCl = 56 ml/min  4/28:  HL @ 0417 = 0.30, therapeutic X 1 4/28:  HL @ 1003 = 0.19, subtherapeutic  4/28:  HL @ 1928 = 0.24, subtherapeutic 4/29 0234 HL 0.29, subtherapeutic  Goal of Therapy:  Heparin  level 0.3-0.7 units/ml Monitor platelets by anticoagulation protocol: Yes   Plan:  - give 1400 unit bolus x 1 -  increase heparin  infusion to a rate of 2150 units/hr - check heparin  level 6 hours after rate change - monitor CBC daily while on heparin     Coretta Dexter, PharmD, [facility] 07/01/2023 3:12 AM

## 2023-07-01 NOTE — H&P (View-Only) (Signed)
 Progress Note    07/01/2023 8:19 AM * No surgery found *  Subjective:  This is a 63 y.o. male with medical history significant for CKD, chronic anemia, HTN, recently metastatic diagnosed colon cancer status post recent port placement on 4/24 and with plans for liver biopsy on 4/29, being admitted with bilateral PE after presenting to the ED with a 1 month history of shortness of breath which acutely worsened on the day of arrival with O2 sats at home 90 to 93%.  He endorses lower extremity edema, right greater than left for the past 3 weeks.  Denies chest pain.  Wife at bedside contributes to history.     Upon workup patient underwent a CTA of the chest which showed bilateral pulmonary embolisms without right heart strain.  Patient also underwent bilateral lower extremity ultrasounds which reveal a right lower extremity thrombus which is occlusive in the femoral vein and a left lower extremity thrombus occlusive in the profundus and calf veins.   On exam this morning patient is resting comfortably in bed.  Noted labored breathing.  Patient requiring 3 L nasal cannula oxygen this morning.  Patient endorses his condition remains the same.  Vitals are remained stable.  Review of Systems  Constitutional:  Constitutional negative. Eyes: Eyes negative.  Respiratory: Positive for shortness of breath.  Cardiovascular: Positive for leg swelling.  GI: Gastrointestinal negative.  GU: Genitourinary negative. Musculoskeletal: Positive for leg pain.  Skin: Skin negative.  Neurological: Neurological negative. Psychiatric: Psychiatric negative.  All other systems reviewed and are negative      Vitals:   07/01/23 0400 07/01/23 0641  BP: (!) 192/94 (!) 195/92  Pulse: 98   Resp:    Temp: (!) 97.4 F (36.3 C)   SpO2: 96%    Physical Exam: Cardiac:  RRR with tachycardia, normal S1-S2.  No rubs clicks gallops or murmurs noted. Lungs: Lungs rhonchorous throughout with mild wheezing on  auscultation.  Labored breathing noted.  Patient requiring 3 L nasal cannula oxygen. Incisions: None Extremities: Bilateral lower extremities with +2 edema.  Unable to palpate pulses.  Upper extremities normal with palpable pulses. Abdomen: Positive bowel sounds throughout, soft, nontender nondistended. Neurologic: AAO x 3, answers all questions follows commands appropriately.  CBC    Component Value Date/Time   WBC 11.0 (H) 07/01/2023 0234   RBC 2.98 (L) 07/01/2023 0234   HGB 7.5 (L) 07/01/2023 0234   HGB 15.0 12/05/2011 0955   HCT 24.1 (L) 07/01/2023 0234   HCT 43.2 12/05/2011 0955   PLT 276 07/01/2023 0234   PLT 228 12/05/2011 0955   MCV 80.9 07/01/2023 0234   MCV 84 12/05/2011 0955   MCH 25.2 (L) 07/01/2023 0234   MCHC 31.1 07/01/2023 0234   RDW 16.8 (H) 07/01/2023 0234   RDW 13.8 12/05/2011 0955   LYMPHSABS 0.5 (L) 06/29/2023 2046   MONOABS 0.8 06/29/2023 2046   EOSABS 0.0 06/29/2023 2046   BASOSABS 0.0 06/29/2023 2046    BMET    Component Value Date/Time   NA 136 06/29/2023 2046   NA 139 03/19/2023 0934   NA 142 12/05/2011 0955   K 3.8 06/29/2023 2046   K 3.7 12/05/2011 0955   CL 102 06/29/2023 2046   CL 107 12/05/2011 0955   CO2 18 (L) 06/29/2023 2046   CO2 27 12/05/2011 0955   GLUCOSE 102 (H) 06/29/2023 2046   GLUCOSE 95 12/05/2011 0955   BUN 22 06/29/2023 2046   BUN 16 03/19/2023 0934   BUN 14  12/05/2011 0955   CREATININE 1.50 (H) 06/29/2023 2046   CREATININE 1.38 (H) 12/05/2011 0955   CALCIUM 8.8 (L) 06/29/2023 2046   CALCIUM 9.0 12/05/2011 0955   GFRNONAA 52 (L) 06/29/2023 2046   GFRNONAA 59 (L) 12/05/2011 0955   GFRAA >60 02/22/2019 0633   GFRAA >60 12/05/2011 0955    INR    Component Value Date/Time   INR 1.5 (H) 06/29/2023 2046     Intake/Output Summary (Last 24 hours) at 07/01/2023 0819 Last data filed at 06/30/2023 1616 Gross per 24 hour  Intake 483.56 ml  Output --  Net 483.56 ml     Assessment/Plan:  63 y.o. male with a history  of significant colon cancer with possible metastatic disease presents to [facility] emergency department with shortness of breath that have been worsening over the past month.  Upon workup patient was noted to have bilateral lower extremity occlusive DVTs and bilateral pulmonary embolisms.* No surgery found *   Plan Vascular surgery plans on taking the patient to the vascular lab tomorrow on 07/02/2023 for a pulmonary thrombectomy and inferior vena cava filter placement.  Again this morning I had a conversation with the patient and his wife at the bedside concerning the procedure, benefits, risk, and complications.  Patient and wife verbalized understanding wish to proceed as soon as possible.  I answered all their questions.  Patient will be made n.p.o. after midnight tonight for procedure tomorrow.   DVT prophylaxis: Heparin  infusion  I discussed the case in detail with Dr. Mikki Alexander MD and he agrees with the plan.    Annamaria Barrette [facility] 07/01/2023 8:19 AM

## 2023-07-01 NOTE — TOC Initial Note (Signed)
 Transition of Care [facility]) - Initial/Assessment Note    Patient Details  Name: Trevor Flores MRN: [id number] Date of Birth: [birth year]  Transition of Care [facility]) CM/SW Contact:    Elsie Halo, RN Phone Number: 07/01/2023, 4:27 PM  Clinical Narrative:                 The patient lives at home with his wife. His son also assists with his care. His wife goes to dialysis 3 days a week and is interested in more information on paid caregivers. TOC will send a referral to [facility] with [facility]. The patient has a PCP and doesn't report having difficulty obtaining or taking medications. He drives and his wife and son drive when he isnt feeling well, they will also transport him home from the hospital. The patient has DME including a walker, cane, and shower chair.    TOC will continue to follow.    Barriers to Discharge: Continued Medical Work up   Patient Goals and CMS Choice            Expected Discharge Plan and Services       Living arrangements for the past 2 months: Single Family Home                                      Prior Living Arrangements/Services Living arrangements for the past 2 months: Single Family Home Lives with:: Spouse, Adult Children                   Activities of Daily Living   ADL Screening (condition at time of admission) Independently performs ADLs?: Yes (appropriate for developmental age) Is the patient deaf or have difficulty hearing?: No Does the patient have difficulty seeing, even when wearing glasses/contacts?: No Does the patient have difficulty concentrating, remembering, or making decisions?: No  Permission Sought/Granted                  Emotional Assessment           Psych Involvement: No (comment)  Admission diagnosis:  Bilateral pulmonary embolism (HCC) [I26.99] Acute pulmonary embolism (HCC) [I26.99] Other pulmonary embolism without acute cor pulmonale, unspecified chronicity (HCC)  [I26.99] Malignant neoplasm of colon, unspecified part of colon (HCC) [C18.9] Patient Active Problem List   Diagnosis Date Noted   Stage 3a chronic kidney disease (HCC) 06/29/2023   Elevated troponin 06/29/2023   Bilateral pulmonary embolism (HCC) 06/29/2023   Acute pulmonary embolism (HCC) 06/29/2023   Adenocarcinoma of colon (HCC) 06/17/2023   CKD (chronic kidney disease) 06/17/2023   Normocytic anemia 06/17/2023   Weight loss 06/17/2023   Goals of care, counseling/discussion 06/17/2023   Constipation 06/17/2023   IDA (iron  deficiency anemia) 06/17/2023   Encounter for screening colonoscopy 10/22/2016   Chest pain 01/22/2016   Near syncope 05/25/2014   Dizziness 08/19/2012   Vertigo 08/19/2012   Essential hypertension 08/19/2012   Morbid obesity (HCC) 08/19/2012   PCP:  Antonio Baumgarten, MD Pharmacy:   [facility] [phone] [place], [place] - [address] AT [facility] OF [address] [place] [address] Phone: [phone] Fax: [phone]  [id number] - [place], [place] - [address] [place] [address] Phone: [phone] Fax: [phone]  [facility] - [place], [place] - [address] [place] [address] Phone: [phone] Fax: [phone]     Social Drivers of Health (SDOH) Social History: SDOH Screenings   Food Insecurity: No Food Insecurity (06/30/2023)  Housing: Low Risk  (06/30/2023)  Transportation Needs: No Transportation Needs (06/30/2023)  Utilities: Not At Risk (06/30/2023)  Financial Resource Strain: Low Risk  (06/24/2023)   Received from [facility]  Social Connections: Unknown (08/09/2022)   Received from [facility]  Tobacco Use: Medium Risk (06/30/2023)   SDOH Interventions:     Readmission Risk Interventions     No data to display

## 2023-07-01 NOTE — Progress Notes (Signed)
 PHARMACY - ANTICOAGULATION CONSULT NOTE  Pharmacy Consult for Heparin   Indication: pulmonary embolus  Allergies  Allergen Reactions   Other    Hydralazine Itching and Palpitations    Patient Measurements: Height: 6' (182.9 cm) Weight: 93 kg (205 lb) IBW/kg (Calculated) : 77.6 HEPARIN  DW (KG): 93  Vital Signs: Temp: 98.7 F (37.1 C) (04/29 1121) Temp Source: Oral (04/29 1121) BP: 137/92 (04/29 1121) Pulse Rate: 92 (04/29 1121)  Labs: Recent Labs    06/29/23 2046 06/29/23 2300 06/30/23 0417 06/30/23 1928 07/01/23 0234 07/01/23 0957  HGB 8.4*  --   --   --  7.5*  --   HCT 27.5*  --   --   --  24.1*  --   PLT 212  --   --   --  276  --   APTT 33  --   --   --   --   --   LABPROT 18.0*  --   --   --   --   --   INR 1.5*  --   --   --   --   --   HEPARINUNFRC  --   --    < > 0.24* 0.29* 0.39  CREATININE 1.50*  --   --   --   --  1.18  TROPONINIHS 173* 186*  --   --   --   --    < > = values in this interval not displayed.    Estimated Creatinine Clearance: 71.2 mL/min (by C-G formula based on SCr of 1.18 mg/dL).   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 12/2011 Echo: EF 60%, no rwma.   Hemiparesis affecting right side as late effect of cerebrovascular accident (CVA) (HCC)    History of stress test    a. 04/2004 Ex MV: Ex time 10:30. Hypertensive response. No ischemia/infarct.   Hyperlipidemia    Hypertension    Long-term use of aspirin  therapy    Palpitations    Parkinson disease (HCC)    Personal history of  gout    Possible Lacunar infarction (HCC)    a. 12/2011 Head CT: CSF density lateral to R cerebral peduncle - ? prior lacunar infarct.   Pre-syncope    a. 06/2014 Monitor: Sinus rhythm. No significant arrhythmia.   Resting tremor    Stroke [facility])    Vascular parkinsonism [facility])    Vertigo     Assessment: Pharmacy consulted to dose heparin  in this 63 year old male with PE.  No prior anticoag noted. CrCl = 56 ml/min  4/28:  HL @ 0417 = 0.30, therapeutic X 1 4/28:  HL @ 1003 = 0.19, subtherapeutic  4/28:  HL @ 1928 = 0.24, subtherapeutic 4/29 0234 HL 0.29,  subtherapeutic 4/29 0957 HL 0.39,   therapeutic x 1  Goal of Therapy:  Heparin  level 0.3-0.7 units/ml Monitor platelets by anticoagulation protocol: Yes   Plan: heparin  level therapeutic x 2 -continue  heparin  infusion at a rate of 2150 units/hr - can now check heparin  level once daily: next 04/30 am  - monitor CBC daily while on heparin    Barney Boozer, PharmD, BCPS Clinical Pharmacist 07/01/2023

## 2023-07-01 NOTE — Progress Notes (Signed)
 Progress Note    07/01/2023 8:19 AM * No surgery found *  Subjective:  This is a 63 y.o. male with medical history significant for CKD, chronic anemia, HTN, recently metastatic diagnosed colon cancer status post recent port placement on 4/24 and with plans for liver biopsy on 4/29, being admitted with bilateral PE after presenting to the ED with a 1 month history of shortness of breath which acutely worsened on the day of arrival with O2 sats at home 90 to 93%.  He endorses lower extremity edema, right greater than left for the past 3 weeks.  Denies chest pain.  Wife at bedside contributes to history.     Upon workup patient underwent a CTA of the chest which showed bilateral pulmonary embolisms without right heart strain.  Patient also underwent bilateral lower extremity ultrasounds which reveal a right lower extremity thrombus which is occlusive in the femoral vein and a left lower extremity thrombus occlusive in the profundus and calf veins.   On exam this morning patient is resting comfortably in bed.  Noted labored breathing.  Patient requiring 3 L nasal cannula oxygen this morning.  Patient endorses his condition remains the same.  Vitals are remained stable.  Review of Systems  Constitutional:  Constitutional negative. Eyes: Eyes negative.  Respiratory: Positive for shortness of breath.  Cardiovascular: Positive for leg swelling.  GI: Gastrointestinal negative.  GU: Genitourinary negative. Musculoskeletal: Positive for leg pain.  Skin: Skin negative.  Neurological: Neurological negative. Psychiatric: Psychiatric negative.  All other systems reviewed and are negative      Vitals:   07/01/23 0400 07/01/23 0641  BP: (!) 192/94 (!) 195/92  Pulse: 98   Resp:    Temp: (!) 97.4 F (36.3 C)   SpO2: 96%    Physical Exam: Cardiac:  RRR with tachycardia, normal S1-S2.  No rubs clicks gallops or murmurs noted. Lungs: Lungs rhonchorous throughout with mild wheezing on  auscultation.  Labored breathing noted.  Patient requiring 3 L nasal cannula oxygen. Incisions: None Extremities: Bilateral lower extremities with +2 edema.  Unable to palpate pulses.  Upper extremities normal with palpable pulses. Abdomen: Positive bowel sounds throughout, soft, nontender nondistended. Neurologic: AAO x 3, answers all questions follows commands appropriately.  CBC    Component Value Date/Time   WBC 11.0 (H) 07/01/2023 0234   RBC 2.98 (L) 07/01/2023 0234   HGB 7.5 (L) 07/01/2023 0234   HGB 15.0 12/05/2011 0955   HCT 24.1 (L) 07/01/2023 0234   HCT 43.2 12/05/2011 0955   PLT 276 07/01/2023 0234   PLT 228 12/05/2011 0955   MCV 80.9 07/01/2023 0234   MCV 84 12/05/2011 0955   MCH 25.2 (L) 07/01/2023 0234   MCHC 31.1 07/01/2023 0234   RDW 16.8 (H) 07/01/2023 0234   RDW 13.8 12/05/2011 0955   LYMPHSABS 0.5 (L) 06/29/2023 2046   MONOABS 0.8 06/29/2023 2046   EOSABS 0.0 06/29/2023 2046   BASOSABS 0.0 06/29/2023 2046    BMET    Component Value Date/Time   NA 136 06/29/2023 2046   NA 139 03/19/2023 0934   NA 142 12/05/2011 0955   K 3.8 06/29/2023 2046   K 3.7 12/05/2011 0955   CL 102 06/29/2023 2046   CL 107 12/05/2011 0955   CO2 18 (L) 06/29/2023 2046   CO2 27 12/05/2011 0955   GLUCOSE 102 (H) 06/29/2023 2046   GLUCOSE 95 12/05/2011 0955   BUN 22 06/29/2023 2046   BUN 16 03/19/2023 0934   BUN 14  12/05/2011 0955   CREATININE 1.50 (H) 06/29/2023 2046   CREATININE 1.38 (H) 12/05/2011 0955   CALCIUM 8.8 (L) 06/29/2023 2046   CALCIUM 9.0 12/05/2011 0955   GFRNONAA 52 (L) 06/29/2023 2046   GFRNONAA 59 (L) 12/05/2011 0955   GFRAA >60 02/22/2019 0633   GFRAA >60 12/05/2011 0955    INR    Component Value Date/Time   INR 1.5 (H) 06/29/2023 2046     Intake/Output Summary (Last 24 hours) at 07/01/2023 0819 Last data filed at 06/30/2023 1616 Gross per 24 hour  Intake 483.56 ml  Output --  Net 483.56 ml     Assessment/Plan:  63 y.o. male with a history  of significant colon cancer with possible metastatic disease presents to [facility] emergency department with shortness of breath that have been worsening over the past month.  Upon workup patient was noted to have bilateral lower extremity occlusive DVTs and bilateral pulmonary embolisms.* No surgery found *   Plan Vascular surgery plans on taking the patient to the vascular lab tomorrow on 07/02/2023 for a pulmonary thrombectomy and inferior vena cava filter placement.  Again this morning I had a conversation with the patient and his wife at the bedside concerning the procedure, benefits, risk, and complications.  Patient and wife verbalized understanding wish to proceed as soon as possible.  I answered all their questions.  Patient will be made n.p.o. after midnight tonight for procedure tomorrow.   DVT prophylaxis: Heparin  infusion  I discussed the case in detail with Dr. Mikki Alexander MD and he agrees with the plan.    Annamaria Barrette [facility] 07/01/2023 8:19 AM

## 2023-07-01 NOTE — Progress Notes (Signed)
 Progress Note   Patient: Trevor Flores YNW:[id number] [name]: [birth year] DOA: 06/29/2023     1 DOS: the patient was seen and examined on 07/01/2023     Brief hospital course: From HPI "Trevor Flores is a 63 y.o. male with medical history significant for CKD, chronic anemia, HTN, recently metastatic diagnosed colon cancer status post recent port placement on 4/24 and with plans for liver biopsy on 4/29, being admitted with bilateral PE after presenting to the ED with a 1 month history of shortness of breath which acutely worsened on the day of arrival with O2 sats at home 90 to 93%.       Assessment and Plan:  Bilateral pulmonary embolism (HCC) Bilateral lower extremity occlusive DVT Likely provoked by malignancy Continue heparin  infusion and transition to DOAC when stable Case discussed with vascular surgery team who is planning possible IVC filter placement as well as pulmonary thrombectomy tomorrow    Elevated troponin Suspecting demand ischemia Patient denies chest pain and EKG nonacute Echo showing EF 65 to 70% with grade 1 diastolic dysfunction   Adenocarcinoma of colon (HCC) Recently diagnosed Outpatient follow-up with oncology   AKI on stage 3a chronic kidney disease (HCC)-improved At baseline Monitor renal function closely Patient received IV fluid   IDA (iron  deficiency anemia) Hb 7.5 today No evidence of external bleeding noted We will continue to monitor CBC closely as patient is on heparin  drip   Essential hypertension Continue losartan  and metoprolol    DVT prophylaxis: Heparin    Consults:  vascular   Advance Care Planning: full code   Family Communication: Wife at bedside today 07/01/2023   Disposition Plan: Back to previous home environment   Subjective:  Patient seen and examined at bedside this morning Patient admits to improvement in respiratory function Denies nausea vomiting abdominal pain chest pain   Physical Exam: Vitals and nursing note  reviewed.  Constitutional:      General: He is not in acute distress.    Comments: Chronically ill-appearing male HENT:     Head: Normocephalic and atraumatic.  Cardiovascular:     Rate and Rhythm: Normal rate and regular rhythm.     Heart sounds: Normal heart sounds.  Pulmonary: Tachypnea improved    Breath sounds: Normal breath sounds.  Abdominal:     Palpations: Abdomen is soft.     Tenderness: There is no abdominal tenderness.  Musculoskeletal: Bilateral lower extremity edema    Comments: Right greater than left lower extremity edema  Neurological:     Mental Status: Mental status is at baseline.   Data Reviewed:    Latest Ref Rng & Units 07/01/2023    2:34 AM 06/29/2023    8:46 PM 06/17/2023    4:19 PM  CBC  WBC 4.0 - 10.5 K/uL 11.0  9.7  6.6   Hemoglobin 13.0 - 17.0 g/dL 7.5  8.4  8.8   Hematocrit 39.0 - 52.0 % 24.1  27.5  28.9   Platelets 150 - 400 K/uL 276  212  257        Latest Ref Rng & Units 07/01/2023    9:57 AM 06/29/2023    8:46 PM 06/17/2023    4:19 PM  BMP  Glucose 70 - 99 mg/dL 85  657  97   BUN 8 - 23 mg/dL 20  22  20    Creatinine 0.61 - 1.24 mg/dL 8.46  9.62  9.52   Sodium 135 - 145 mmol/L 139  136  136   Potassium  3.5 - 5.1 mmol/L 3.7  3.8  3.7   Chloride 98 - 111 mmol/L 107  102  101   CO2 22 - 32 mmol/L 19  18  20    Calcium 8.9 - 10.3 mg/dL 8.6  8.8  8.9      Vitals:   07/01/23 0824 07/01/23 1121 07/01/23 1629 07/01/23 1707  BP: (!) 190/87 (!) 137/92 138/87   Pulse: 96 92 87   Resp: 18 20 18 20   Temp: 98.6 F (37 C) 98.7 F (37.1 C) (!) 97.4 F (36.3 C)   TempSrc:  Oral Oral   SpO2: 99% 98% 98%   Weight:      Height:        Time spent: 50 minutes  Author: Ezzard Holms, MD 07/01/2023 5:34 PM  For on call review [email or web address].

## 2023-07-01 NOTE — Progress Notes (Signed)
 PHARMACY - ANTICOAGULATION CONSULT NOTE  Pharmacy Consult for Heparin   Indication: pulmonary embolus  Allergies  Allergen Reactions   Other    Hydralazine Itching and Palpitations    Patient Measurements: Height: 6' (182.9 cm) Weight: 93 kg (205 lb) IBW/kg (Calculated) : 77.6 HEPARIN  DW (KG): 93  Vital Signs: Temp: 98.6 F (37 C) (04/29 0824) BP: 190/87 (04/29 0824) Pulse Rate: 96 (04/29 0824)  Labs: Recent Labs    06/29/23 2046 06/29/23 2300 06/30/23 0417 06/30/23 1928 07/01/23 0234 07/01/23 0957  HGB 8.4*  --   --   --  7.5*  --   HCT 27.5*  --   --   --  24.1*  --   PLT 212  --   --   --  276  --   APTT 33  --   --   --   --   --   LABPROT 18.0*  --   --   --   --   --   INR 1.5*  --   --   --   --   --   HEPARINUNFRC  --   --    < > 0.24* 0.29* 0.39  CREATININE 1.50*  --   --   --   --  1.18  TROPONINIHS 173* 186*  --   --   --   --    < > = values in this interval not displayed.    Estimated Creatinine Clearance: 71.2 mL/min (by C-G formula based on SCr of 1.18 mg/dL).   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 12/2011 Echo: EF 60%, no rwma.   Hemiparesis affecting right side as late effect of cerebrovascular accident (CVA) (HCC)    History of stress test    a. 04/2004 Ex MV: Ex time 10:30. Hypertensive response. No ischemia/infarct.   Hyperlipidemia    Hypertension    Long-term use of aspirin  therapy    Palpitations    Parkinson disease (HCC)    Personal history of gout    Possible Lacunar infarction  (HCC)    a. 12/2011 Head CT: CSF density lateral to R cerebral peduncle - ? prior lacunar infarct.   Pre-syncope    a. 06/2014 Monitor: Sinus rhythm. No significant arrhythmia.   Resting tremor    Stroke [facility])    Vascular parkinsonism (HCC)    Vertigo     Medications:    Assessment: Pharmacy consulted to dose heparin  in this 63 year old male with PE.  No prior anticoag noted. CrCl = 56 ml/min  4/28:  HL @ 0417 = 0.30, therapeutic X 1 4/28:  HL @ 1003 = 0.19, subtherapeutic  4/28:  HL @ 1928 = 0.24, subtherapeutic 4/29 0234 HL 0.29,  subtherapeutic 4/29 0957 HL 0.39,   therapeutic x 1  Goal of Therapy:  Heparin  level 0.3-0.7 units/ml Monitor platelets by anticoagulation protocol: Yes   Plan:  -4/29 0957 HL 0.39,  therapeutic x 1 -continue heparin  infusion at a rate of 2150 units/hr - check confirmatory heparin  level in  6 hours  - monitor CBC daily while on heparin    Thomasine Flick PharmD Clinical Pharmacist 07/01/2023

## 2023-07-02 ENCOUNTER — Inpatient Hospital Stay

## 2023-07-02 ENCOUNTER — Encounter: Payer: Self-pay | Admitting: Vascular Surgery

## 2023-07-02 ENCOUNTER — Inpatient Hospital Stay: Admitting: Hospice and Palliative Medicine

## 2023-07-02 ENCOUNTER — Encounter
Admission: EM | Disposition: A | Discharge status: Home Health | Payer: Self-pay | Source: Home / Self Care | Attending: Internal Medicine

## 2023-07-02 ENCOUNTER — Other Ambulatory Visit (HOSPITAL_COMMUNITY): Payer: Self-pay

## 2023-07-02 ENCOUNTER — Inpatient Hospital Stay: Admitting: Oncology

## 2023-07-02 DIAGNOSIS — C189 Malignant neoplasm of colon, unspecified: Secondary | ICD-10-CM | POA: Diagnosis not present

## 2023-07-02 DIAGNOSIS — I517 Cardiomegaly: Secondary | ICD-10-CM

## 2023-07-02 DIAGNOSIS — E872 Acidosis, unspecified: Secondary | ICD-10-CM | POA: Insufficient documentation

## 2023-07-02 DIAGNOSIS — D500 Iron deficiency anemia secondary to blood loss (chronic): Secondary | ICD-10-CM

## 2023-07-02 DIAGNOSIS — E663 Overweight: Secondary | ICD-10-CM | POA: Insufficient documentation

## 2023-07-02 DIAGNOSIS — I82403 Acute embolism and thrombosis of unspecified deep veins of lower extremity, bilateral: Secondary | ICD-10-CM

## 2023-07-02 DIAGNOSIS — I2699 Other pulmonary embolism without acute cor pulmonale: Secondary | ICD-10-CM

## 2023-07-02 HISTORY — PX: PULMONARY THROMBECTOMY: CATH118295

## 2023-07-02 HISTORY — PX: IVC FILTER INSERTION: CATH118245

## 2023-07-02 LAB — CBC
HCT: 24.9 % — ABNORMAL LOW (ref 39.0–52.0)
Hemoglobin: 7.5 g/dL — ABNORMAL LOW (ref 13.0–17.0)
MCH: 25 pg — ABNORMAL LOW (ref 26.0–34.0)
MCHC: 30.1 g/dL (ref 30.0–36.0)
MCV: 83 fL (ref 80.0–100.0)
Platelets: 350 10*3/uL (ref 150–400)
RBC: 3 MIL/uL — ABNORMAL LOW (ref 4.22–5.81)
RDW: 16.7 % — ABNORMAL HIGH (ref 11.5–15.5)
WBC: 8.7 10*3/uL (ref 4.0–10.5)
nRBC: 0 % (ref 0.0–0.2)

## 2023-07-02 LAB — BASIC METABOLIC PANEL WITH GFR
Anion gap: 12 (ref 5–15)
BUN: 20 mg/dL (ref 8–23)
CO2: 20 mmol/L — ABNORMAL LOW (ref 22–32)
Calcium: 8.6 mg/dL — ABNORMAL LOW (ref 8.9–10.3)
Chloride: 108 mmol/L (ref 98–111)
Creatinine, Ser: 1.17 mg/dL (ref 0.61–1.24)
GFR, Estimated: >60 mL/min (ref >60)
Glucose, Bld: 83 mg/dL (ref 70–99)
Potassium: 3.6 mmol/L (ref 3.5–5.1)
Sodium: 140 mmol/L (ref 135–145)

## 2023-07-02 LAB — HEPARIN LEVEL (UNFRACTIONATED): Heparin Unfractionated: 0.31 [IU]/mL (ref 0.30–0.70)

## 2023-07-02 LAB — VITAMIN B12: Vitamin B-12: 4010 pg/mL — ABNORMAL HIGH (ref 180–914)

## 2023-07-02 SURGERY — PULMONARY THROMBECTOMY
Anesthesia: Moderate Sedation

## 2023-07-02 MED ORDER — HEPARIN SODIUM (PORCINE) 1000 UNIT/ML IJ SOLN
INTRAMUSCULAR | Status: DC | PRN
  Administered 2023-07-02: 3000 [IU] via INTRAVENOUS

## 2023-07-02 MED ORDER — FENTANYL CITRATE PF 50 MCG/ML IJ SOSY
PREFILLED_SYRINGE | INTRAMUSCULAR | Status: AC
  Filled 2023-07-02: qty 1

## 2023-07-02 MED ORDER — MIDAZOLAM HCL 2 MG/2ML IJ SOLN
INTRAMUSCULAR | Status: AC
  Filled 2023-07-02: qty 2

## 2023-07-02 MED ORDER — LIDOCAINE-EPINEPHRINE (PF) 1 %-1:200000 IJ SOLN
INTRAMUSCULAR | Status: DC | PRN
  Administered 2023-07-02: 10 mL

## 2023-07-02 MED ORDER — MIDAZOLAM HCL 2 MG/2ML IJ SOLN
INTRAMUSCULAR | Status: DC | PRN
  Administered 2023-07-02: 2 mg via INTRAVENOUS

## 2023-07-02 MED ORDER — FENTANYL CITRATE (PF) 100 MCG/2ML IJ SOLN
INTRAMUSCULAR | Status: DC | PRN
  Administered 2023-07-02 (×2): 50 ug via INTRAVENOUS

## 2023-07-02 MED ORDER — HEPARIN (PORCINE) IN NACL 2000-0.9 UNIT/L-% IV SOLN
INTRAVENOUS | Status: DC | PRN
  Administered 2023-07-02: 1000 mL

## 2023-07-02 MED ORDER — HEPARIN (PORCINE) 25000 UT/250ML-% IV SOLN: 2250.0000 [IU]/h | INTRAVENOUS | Status: AC

## 2023-07-02 MED ORDER — ACETAMINOPHEN 325 MG PO TABS
ORAL_TABLET | ORAL | Status: AC
  Filled 2023-07-02: qty 2

## 2023-07-02 MED ORDER — FENTANYL CITRATE PF 50 MCG/ML IJ SOSY
PREFILLED_SYRINGE | INTRAMUSCULAR | Status: AC
Start: 2023-07-02 — End: ?
  Filled 2023-07-02: qty 1

## 2023-07-02 MED ORDER — HEPARIN (PORCINE) 25000 UT/250ML-% IV SOLN
INTRAVENOUS | Status: AC
Start: 2023-07-02 — End: ?
  Filled 2023-07-02: qty 250

## 2023-07-02 MED ORDER — SODIUM BICARBONATE 650 MG PO TABS
650.0000 mg | ORAL_TABLET | Freq: Two times a day (BID) | ORAL | Status: DC
  Administered 2023-07-02 – 2023-07-04 (×5): 650 mg via ORAL
  Filled 2023-07-02 (×5): qty 1

## 2023-07-02 MED ORDER — IRON SUCROSE 300 MG IVPB - SIMPLE MED
300.0000 mg | Freq: Once | Status: AC
  Administered 2023-07-02: 300 mg via INTRAVENOUS
  Filled 2023-07-02: qty 300

## 2023-07-02 MED ORDER — SENNOSIDES-DOCUSATE SODIUM 8.6-50 MG PO TABS
2.0000 | ORAL_TABLET | Freq: Two times a day (BID) | ORAL | Status: DC
  Administered 2023-07-02 – 2023-07-03 (×3): 2 via ORAL
  Filled 2023-07-02 (×4): qty 2

## 2023-07-02 MED ORDER — CEFAZOLIN SODIUM-DEXTROSE 2-4 GM/100ML-% IV SOLN
INTRAVENOUS | Status: AC
Start: 2023-07-02 — End: ?
  Filled 2023-07-02: qty 100

## 2023-07-02 MED ORDER — IODIXANOL 320 MG/ML IV SOLN
INTRAVENOUS | Status: DC | PRN
  Administered 2023-07-02: 55 mL via INTRAVENOUS

## 2023-07-02 MED ORDER — SODIUM CHLORIDE 0.9 % IV SOLN: INTRAVENOUS | Status: DC

## 2023-07-02 MED ORDER — CEFAZOLIN SODIUM-DEXTROSE 2-4 GM/100ML-% IV SOLN
2.0000 g | INTRAVENOUS | Status: AC
  Administered 2023-07-02: 2 g via INTRAVENOUS

## 2023-07-02 MED ORDER — APIXABAN 5 MG PO TABS
10.0000 mg | ORAL_TABLET | Freq: Two times a day (BID) | ORAL | Status: DC
  Administered 2023-07-02 – 2023-07-04 (×4): 10 mg via ORAL
  Filled 2023-07-02 (×4): qty 2

## 2023-07-02 MED ORDER — HEPARIN SODIUM (PORCINE) 1000 UNIT/ML IJ SOLN
INTRAMUSCULAR | Status: AC
  Filled 2023-07-02: qty 10

## 2023-07-02 MED ORDER — APIXABAN 5 MG PO TABS: 5.0000 mg | ORAL_TABLET | Freq: Two times a day (BID) | ORAL | Status: DC

## 2023-07-02 SURGICAL SUPPLY — 17 items
CANISTER PENUMBRA ENGINE (MISCELLANEOUS) IMPLANT
CATH ANGIO 5F PIGTAIL 100CM (CATHETERS) IMPLANT
CATH INDIGO 12XTORQ 100 (CATHETERS) IMPLANT
CATH INDIGO SEP 12 (CATHETERS) IMPLANT
CATH SELECT BERN TIP 5F 130 (CATHETERS) IMPLANT
CLOSURE PERCLOSE PROSTYLE (VASCULAR PRODUCTS) IMPLANT
COVER PROBE ULTRASOUND 5X96 (MISCELLANEOUS) IMPLANT
GLIDEWIRE ADV .035X180CM (WIRE) IMPLANT
KIT FEM OPTION ELITE FILTER (Filter) IMPLANT
PACK ANGIOGRAPHY (CUSTOM PROCEDURE TRAY) ×2 IMPLANT
SHEATH BRITE TIP 6FRX11 (SHEATH) IMPLANT
SHEATH CHCK-FLO 14FR 13 (SHEATH) IMPLANT
SUT MNCRL AB 4-0 PS2 18 (SUTURE) IMPLANT
SYR MEDRAD MARK 7 150ML (SYRINGE) IMPLANT
TUBING CONTRAST HIGH PRESS 72 (TUBING) IMPLANT
WIRE J 3MM .035X145CM (WIRE) IMPLANT
WIRE SUPRACORE 300CM (WIRE) IMPLANT

## 2023-07-02 NOTE — Progress Notes (Signed)
 PHARMACY - ANTICOAGULATION CONSULT NOTE  Pharmacy Consult for Apixaban  Indication: pulmonary embolus  Patient Measurements: Height: 6' (182.9 cm) Weight: 93 kg (205 lb) IBW/kg (Calculated) : 77.6 HEPARIN  DW (KG): 93  Labs: Recent Labs    06/29/23 2046 06/29/23 2300 06/30/23 0417 07/01/23 0234 07/01/23 0957 07/01/23 1633 07/02/23 0435  HGB 8.4*  --   --  7.5*  --   --  7.5*  HCT 27.5*  --   --  24.1*  --   --  24.9*  PLT 212  --   --  276  --   --  350  APTT 33  --   --   --   --   --   --   LABPROT 18.0*  --   --   --   --   --   --   INR 1.5*  --   --   --   --   --   --   HEPARINUNFRC  --   --    < > 0.29* 0.39 0.45 0.31  CREATININE 1.50*  --   --   --  1.18  --  1.17  TROPONINIHS 173* 186*  --   --   --   --   --    < > = values in this interval not displayed.    Estimated Creatinine Clearance: 71.9 mL/min (by C-G formula based on SCr of 1.17 mg/dL).   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 12/2011 Echo: EF 60%, no rwma.   Hemiparesis affecting right side as late effect of cerebrovascular accident (CVA) (HCC)    History of stress test    a. 04/2004 Ex MV: Ex time 10:30. Hypertensive response. No ischemia/infarct.   Hyperlipidemia    Hypertension    Long-term use of aspirin  therapy    Palpitations    Parkinson disease (HCC)    Personal history of gout    Possible Lacunar infarction (HCC)    a. 12/2011 Head CT: CSF density lateral to R cerebral peduncle - ? prior lacunar infarct.   Pre-syncope    a. 06/2014  Monitor: Sinus rhythm. No significant arrhythmia.   Resting tremor    Stroke [facility])    Vascular parkinsonism [facility])    Vertigo    Assessment: Pharmacy consulted to transition IV heparin  to Eliquis in this 63 year old male with PE s/p mechanical thrombectomy.   Plan:  --Continue IV heparin  at 2250 units/hr until 07/02/23 at 1800 at which point heparin  should be stopped and the patient then be initiated on apixaban 10 mg BID x 7 days followed by apixaban 5 mg BID should be started --CBC per protocol while inpatient  Suad Autrey B Oliviagrace Crisanti 07/02/2023 3:00 PM

## 2023-07-02 NOTE — Interval H&P Note (Signed)
 History and Physical Interval Note:  07/02/2023 9:46 AM  Trevor Flores  has presented today for surgery, with the diagnosis of Pulmonary Embolism.  The various methods of treatment have been discussed with the patient and family. After consideration of risks, benefits and other options for treatment, the patient has consented to  Procedure(s): PULMONARY THROMBECTOMY (Bilateral) IVC FILTER INSERTION (N/A) as a surgical intervention.  The patient's history has been reviewed, patient examined, no change in status, stable for surgery.  I have reviewed the patient's chart and labs.  Questions were answered to the patient's satisfaction.     Dhiya Smits

## 2023-07-02 NOTE — Progress Notes (Addendum)
  Progress Note   Patient: Trevor Flores ZOX:[id number] [name]: 04/21/1960 DOA: 06/29/2023     2 DOS: the patient was seen and examined on 07/02/2023   Brief hospital course: From HPI "JAMIL PIGEON is a 63 y.o. male with medical history significant for CKD, chronic anemia, HTN, recently metastatic diagnosed colon cancer status post recent port placement on 4/24 and with plans for liver biopsy on 4/29, being admitted with bilateral PE after presenting to the ED with a 1 month history of shortness of breath which acutely worsened on the day of arrival with O2 sats at home 90 to 93%.    CT angiogram chest showed bilateral distal central pulmonary emboli, bilateral lower extremity ultrasound showed DVT.  Patient was placed on heparin  drip.  Pulmonary thrombectomy and IVC travel performed on 4/30.   Principal Problem:   Bilateral pulmonary embolism (HCC) Active Problems:   Elevated troponin   Adenocarcinoma of colon (HCC)   Essential hypertension   IDA (iron  deficiency anemia)   Stage 3a chronic kidney disease (HCC)   Acute pulmonary embolism (HCC)   Metabolic acidosis   Assessment and Plan:  Bilateral pulmonary embolism (HCC) Bilateral lower extremity occlusive DVT Elevated troponin secondary to demand ischemia from PE. Patient is status post pulmonary thrombectomy and IVC filter placement.  Patient currently on heparin , based on literature search, will transition anticoagulation to Eliquis .  Patient will be monitored overnight, plan to discharge tomorrow. Echo showing EF 65 to 70% with grade 1 diastolic dysfunction   Adenocarcinoma of colon [facility]) Recently diagnosed Outpatient follow-up with oncology    stage 3a chronic kidney disease (HCC) Acute kidney injury ruled out. Metabolic acidosis. At baseline    IDA (iron  deficiency anemia) No Evidence of acute bleeding, give a dose of IV iron .  Monitor hemoglobin.  Also check a B12 level.   Essential hypertension Continue losartan   and metoprolol   Parkinson disease. Continue home medicines.    Subjective:  Patient feels well today, has some tremor, no short of breath or chest pain.  Physical Exam: Vitals:   07/02/23 1130 07/02/23 1132 07/02/23 1145 07/02/23 1221  BP: 133/88 133/88 122/80 127/84  Pulse: 91 91 88 88  Resp: (!) 26 (!) 23 (!) 22 20  Temp:    98.4 F (36.9 C)  TempSrc:    Oral  SpO2: 92% 94% 100% 98%  Weight:      Height:       General exam: Appears calm and comfortable  Respiratory system: Clear to auscultation. Respiratory effort normal. Cardiovascular system: S1 & S2 heard, RRR. No JVD, murmurs, rubs, gallops or clicks. No pedal edema. Gastrointestinal system: Abdomen is nondistended, soft and nontender. No organomegaly or masses felt. Normal bowel sounds heard. Central nervous system: Alert and oriented.  Hand tremor. Extremities: Symmetric 5 x 5 power. Skin: No rashes, lesions or ulcers Psychiatry: Judgement and insight appear normal. Mood & affect appropriate.    Data Reviewed:  Ultrasound, CT scan results, lab results reviewed.  Family Communication: Son updated at bedside.  Disposition: Status is: Inpatient Remains inpatient appropriate because: Severity of disease,     Time spent: 35 minutes  Author: Donaciano Frizzle, MD 07/02/2023 2:51 PM  For on call review [email or web address].

## 2023-07-02 NOTE — Progress Notes (Signed)
 PHARMACY - ANTICOAGULATION CONSULT NOTE  Pharmacy Consult for Heparin   Indication: pulmonary embolus  Allergies  Allergen Reactions   Other    Hydralazine Itching and Palpitations    Patient Measurements: Height: 6' (182.9 cm) Weight: 93 kg (205 lb) IBW/kg (Calculated) : 77.6 HEPARIN  DW (KG): 93  Vital Signs: Temp: 97.6 F (36.4 C) (04/30 0414) BP: 133/87 (04/30 0414) Pulse Rate: 81 (04/30 0414)  Labs: Recent Labs    06/29/23 2046 06/29/23 2300 06/30/23 0417 07/01/23 0234 07/01/23 0957 07/01/23 1633 07/02/23 0435  HGB 8.4*  --   --  7.5*  --   --  7.5*  HCT 27.5*  --   --  24.1*  --   --  24.9*  PLT 212  --   --  276  --   --  350  APTT 33  --   --   --   --   --   --   LABPROT 18.0*  --   --   --   --   --   --   INR 1.5*  --   --   --   --   --   --   HEPARINUNFRC  --   --    < > 0.29* 0.39 0.45 0.31  CREATININE 1.50*  --   --   --  1.18  --  1.17  TROPONINIHS 173* 186*  --   --   --   --   --    < > = values in this interval not displayed.    Estimated Creatinine Clearance: 71.9 mL/min (by C-G formula based on SCr of 1.17 mg/dL).   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 12/2011 Echo: EF 60%, no rwma.   Hemiparesis affecting right side as late effect of cerebrovascular accident (CVA) (HCC)    History of stress test    a. 04/2004 Ex MV: Ex time 10:30. Hypertensive response. No ischemia/infarct.   Hyperlipidemia    Hypertension    Long-term use of aspirin  therapy    Palpitations    Parkinson disease (HCC)     Personal history of gout    Possible Lacunar infarction (HCC)    a. 12/2011 Head CT: CSF density lateral to R cerebral peduncle - ? prior lacunar infarct.   Pre-syncope    a. 06/2014 Monitor: Sinus rhythm. No significant arrhythmia.   Resting tremor    Stroke [facility])    Vascular parkinsonism [facility])    Vertigo     Assessment: Pharmacy consulted to dose heparin  in this 63 year old male with PE.  No prior anticoag noted. CrCl = 56 ml/min  4/28:  HL @ 0417 = 0.30, therapeutic X 1 4/28:  HL @ 1003 = 0.19, subtherapeutic  4/28:  HL @ 1928 = 0.24, subtherapeutic 4/29 0234 HL 0.29,  subtherapeutic 4/29 0957 HL 0.39,   therapeutic x 1 4/29 1633 HL 0.45, therapeutic x 2 4/30 0425 HL  0.31, therapeutic x 3  Goal of Therapy:  Heparin  level 0.3-0.7 units/ml Monitor platelets by anticoagulation protocol: Yes   Plan:  - With HL on low end of therapeutic range and trending down, will increase heparin  infusion to a rate of 2250 units/hr - Will reconfirm HL therapeutic in 8 hrs, then daily  - monitor CBC daily while on heparin    Coretta Dexter, PharmD, [facility] 07/02/2023 6:01 AM

## 2023-07-02 NOTE — Hospital Course (Addendum)
 From HPI "Trevor Flores is a 63 y.o. male with medical history significant for CKD, chronic anemia, HTN, recently metastatic diagnosed colon cancer status post recent port placement on 4/24 and with plans for liver biopsy on 4/29, being admitted with bilateral PE after presenting to the ED with a 1 month history of shortness of breath which acutely worsened on the day of arrival with O2 sats at home 90 to 93%.    CT angiogram chest showed bilateral distal central pulmonary emboli, bilateral lower extremity ultrasound showed DVT.  Patient was placed on heparin  drip.  Pulmonary thrombectomy and IVC filter performed on 4/30.  Transitioned to Eliquis .  Condition continued to be stable, medically stable for discharge.

## 2023-07-02 NOTE — Op Note (Signed)
 [facility]  Percutaneous Study/Intervention Procedural Note   Date of Surgery: 07/02/2023,10:43 AM  Surgeon: Mikki Alexander  Pre-operative Diagnosis: Symptomatic bilateral pulmonary emboli  Post-operative diagnosis:  Same  Procedure(s) Performed:  1.  Contrast injection right heart  2.  Mechanical thrombectomy using the penumbra CAT 12 catheter to the right upper lobe, middle lobe, and lower lobe as well as the left main and lower lobe pulmonary arteries  3.  Selective catheter placement left lower lobe and upper lobe pulmonary arteries  4.  Selective catheter placement right lower lobe, middle lobe, and upper lobe pulmonary arteries  5.  IVC filter placement using an option Elite IVC filter    Anesthesia: Conscious sedation was administered under my direct supervision by the interventional radiology RN. IV Versed  plus fentanyl  were utilized. Continuous ECG, pulse oximetry and blood pressure was monitored throughout the entire procedure.  Versed  and fentanyl  were administered intravenously.  Conscious sedation was administered for a total of 27 minutes using 2 mg of Versed  and 100 mcg of Fentanyl .  EBL: 250 cc  Sheath: 14 French right femoral vein  Contrast: 55 cc   Fluoroscopy Time: 6.9 minutes  Indications:  Patient presents with pulmonary emboli. The patient is symptomatic with hypoxemia and dyspnea on exertion.  There is evidence of right heart strain on the CT angiogram. The patient is otherwise a good candidate for intervention and even the long-term benefits pulmonary angiography with thrombolysis is offered. The risks and benefits are reviewed long-term benefits are discussed. All questions are answered patient agrees to proceed.  He also has extensive bilateral lower extremity DVT so an IVC filter will be placed.  With his metastatic cancer, he is clearly very hypercoagulable.  Procedure:  MARKELL AVRAM a 63 y.o. male who was identified and appropriate  procedural time out was performed.  The patient was then placed supine on the table and prepped and draped in the usual sterile fashion.  Ultrasound was used to evaluate the right common femoral vein.   A digital ultrasound image was acquired for the permanent record.  A Seldinger needle was used to access the right common femoral vein under direct ultrasound guidance.  A 0.035 J wire was advanced without resistance and a 6Fr sheath was placed. A proglide device was placed in a preclose fashion and then upsized to a 14 [place] sheath.    The Advantage wire and pigtail catheter were then negotiated into the right atrium and bolus injection of contrast was utilized to demonstrate the right ventricle and the pulmonary artery outflow. The Advantage wire and catheter were then negotiated into the the pulmonary arteries.  The left pulmonary artery was cannulated and advanced into the left upper lobe and left lower lobe for selective imaging. This demonstrated fairly extensive thrombus burden in the left lower lobe pulmonary artery with a small amount of thrombus in the left upper lobe pulmonary artery.  I then transitioned to the right side with the advantage wire and catheter and first cannulated the right lower lobe and then the right middle and upper lobes.  This demonstrated fairly extensive thrombus burden particularly in the right lower lobe with a smaller amount of thrombus in the right middle and upper lobe pulmonary arteries.  3000 units of heparin  was then given and allowed to circulate.   The Penumbra Cat 12 catheter was then advanced up into the pulmonary vasculature. The right lung was addressed first. Catheter was negotiated into the right upper lobe  pulmonary artery and mechanical thrombectomy was performed with the help of the separator. Follow-up imaging demonstrated a good result and therefore the catheter was renegotiated into the right middle lobe pulmonary artery and mechanical  thrombectomy was performed with good result.  I then negotiated into the right lower lobe pulmonary artery and again mechanical thrombectomy was performed. Passes were made with both the Penumbra catheter itself as well as introducing the separator. Follow-up imaging was then performed.  Significant improvement was seen on the right side so we turned our attention to the left side.  The Penumbra Cat 12 catheter was then negotiated to the opposite side. The left lung was then addressed. Catheter was negotiated into the left lower lobe and mechanical thrombectomy was performed with the separator.  With a small amount of thrombus in the left upper lobe pulmonary artery, the left lower lobe was only treated on the left side but extensive thrombectomy was performed in the left lower lobe.  Follow-up imaging was then performed.  Significant improvement was seen.  Imaging was then performed through the right femoral sheath to evaluate the IVC.  The right iliac vein and IVC were widely patent.  The renal vein was at the level of L1.  An option Elite IVC filter was then deployed at the level of L2 and axial location.  The delivery sheath was then removed.  After review these images wires were reintroduced and the catheter is removed. Then, the sheath is then pulled, the proglide device is secured, a Monocryl skin suture was placed and pressure is held. A sterile dressing is placed.    Findings:   Right heart imaging:  Right atrium and right ventricle and the pulmonary outflow tract appears somewhat dilated  Right lung:  This demonstrated fairly extensive thrombus burden particularly in the right lower lobe with a smaller amount of thrombus in the right middle and upper lobe pulmonary arteries.  Left lung:  This demonstrated fairly extensive thrombus burden in the left lower lobe pulmonary artery with a small amount of thrombus in the left upper lobe pulmonary artery.    Disposition: Patient was taken to the  recovery room in stable condition having tolerated the procedure well.  Lekendrick Alpern 07/02/2023,10:43 AM

## 2023-07-03 ENCOUNTER — Encounter: Admitting: Hospice and Palliative Medicine

## 2023-07-03 ENCOUNTER — Encounter

## 2023-07-03 DIAGNOSIS — C189 Malignant neoplasm of colon, unspecified: Secondary | ICD-10-CM | POA: Diagnosis not present

## 2023-07-03 DIAGNOSIS — Z95828 Presence of other vascular implants and grafts: Secondary | ICD-10-CM | POA: Diagnosis not present

## 2023-07-03 DIAGNOSIS — D500 Iron deficiency anemia secondary to blood loss (chronic): Secondary | ICD-10-CM | POA: Diagnosis not present

## 2023-07-03 DIAGNOSIS — I82513 Chronic embolism and thrombosis of femoral vein, bilateral: Secondary | ICD-10-CM

## 2023-07-03 DIAGNOSIS — I2699 Other pulmonary embolism without acute cor pulmonale: Secondary | ICD-10-CM | POA: Diagnosis not present

## 2023-07-03 LAB — CBC
HCT: 23.1 % — ABNORMAL LOW (ref 39.0–52.0)
Hemoglobin: 7 g/dL — ABNORMAL LOW (ref 13.0–17.0)
MCH: 25.2 pg — ABNORMAL LOW (ref 26.0–34.0)
MCHC: 30.3 g/dL (ref 30.0–36.0)
MCV: 83.1 fL (ref 80.0–100.0)
Platelets: 338 10*3/uL (ref 150–400)
RBC: 2.78 MIL/uL — ABNORMAL LOW (ref 4.22–5.81)
RDW: 16.5 % — ABNORMAL HIGH (ref 11.5–15.5)
WBC: 6.7 10*3/uL (ref 4.0–10.5)
nRBC: 0 % (ref 0.0–0.2)

## 2023-07-03 LAB — MAGNESIUM: Magnesium: 2.2 mg/dL (ref 1.7–2.4)

## 2023-07-03 LAB — BASIC METABOLIC PANEL WITH GFR
Anion gap: 10 (ref 5–15)
BUN: 17 mg/dL (ref 8–23)
CO2: 20 mmol/L — ABNORMAL LOW (ref 22–32)
Calcium: 8.3 mg/dL — ABNORMAL LOW (ref 8.9–10.3)
Chloride: 110 mmol/L (ref 98–111)
Creatinine, Ser: 1 mg/dL (ref 0.61–1.24)
GFR, Estimated: >60 mL/min (ref >60)
Glucose, Bld: 74 mg/dL (ref 70–99)
Potassium: 3.5 mmol/L (ref 3.5–5.1)
Sodium: 140 mmol/L (ref 135–145)

## 2023-07-03 LAB — PREPARE RBC (CROSSMATCH)

## 2023-07-03 MED ORDER — POTASSIUM CHLORIDE CRYS ER 20 MEQ PO TBCR
40.0000 meq | EXTENDED_RELEASE_TABLET | Freq: Once | ORAL | Status: AC
  Administered 2023-07-03: 40 meq via ORAL
  Filled 2023-07-03: qty 2

## 2023-07-03 MED ORDER — SODIUM CHLORIDE 0.9% IV SOLUTION: Freq: Once | INTRAVENOUS | Status: AC

## 2023-07-03 NOTE — Care Management Important Message (Signed)
 Important Message  Patient Details  Name: Trevor Flores MRN: [id number] Date of Birth: 1960-04-12   Important Message Given:  Yes - Medicare IM     Anise Kerns 07/03/2023, 12:03 PM

## 2023-07-03 NOTE — Progress Notes (Addendum)
 Progress Note   Patient: Trevor Flores:[id number] [name]: [birth year] DOA: 06/29/2023     3 DOS: the patient was seen and examined on 07/03/2023   Brief hospital course: From HPI "Trevor Flores is a 63 y.o. male with medical history significant for CKD, chronic anemia, HTN, recently metastatic diagnosed colon cancer status post recent port placement on 4/24 and with plans for liver biopsy on 4/29, being admitted with bilateral PE after presenting to the ED with a 1 month history of shortness of breath which acutely worsened on the day of arrival with O2 sats at home 90 to 93%.    CT angiogram chest showed bilateral distal central pulmonary emboli, bilateral lower extremity ultrasound showed DVT.  Patient was placed on heparin  drip.  Pulmonary thrombectomy and IVC filter performed on 4/30.   Principal Problem:   Bilateral pulmonary embolism (HCC) Active Problems:   Elevated troponin   Adenocarcinoma of colon (HCC)   Essential hypertension   IDA (iron  deficiency anemia)   Stage 3a chronic kidney disease (HCC)   Acute pulmonary embolism (HCC)   Metabolic acidosis   Overweight (BMI 25.0-29.9)   Assessment and Plan: Bilateral pulmonary embolism (HCC) Bilateral lower extremity occlusive DVT Elevated troponin secondary to demand ischemia from PE. Patient is status post pulmonary thrombectomy and IVC filter placement.  Patient currently on heparin , based on literature search, will transition anticoagulation to Eliquis  4/30. Echo showing EF 65 to 70% with grade 1 diastolic dysfunction Condition still stable, but hemoglobin dropped down to 7.0.  Will hold for another day before discharge.  Adenocarcinoma of colon [facility]) Recently diagnosed Outpatient follow-up with oncology    stage 3a chronic kidney disease (HCC) Acute kidney injury ruled out. Metabolic acidosis. Renal function at baseline, continue sodium bicarb. Potassium 3.5, give 40 mEq of potassium oral.     IDA (iron   deficiency anemia) No Evidence of acute bleeding, received a dose of IV iron .  B12 level elevated. Hemoglobin dropped down to 7.0 today, patient does not have additional bleeding from surgery site, denies any black stool or rectal bleeding.  Patient receiving 1 unit PRBC today.   Essential hypertension Continue losartan  and metoprolol    Parkinson disease. Continue home medicines.      Subjective:  Patient doing well today, currently no complaint.  Physical Exam: Vitals:   07/02/23 1712 07/02/23 2227 07/03/23 0453 07/03/23 0815  BP: 130/83 113/79 135/88 128/83  Pulse: 87 75 80 76  Resp: 20  20 18   Temp: 98.2 F (36.8 C) 98.3 F (36.8 C) 97.9 F (36.6 C) 97.9 F (36.6 C)  TempSrc: Oral Oral Oral Oral  SpO2: 99% 97% 97% 98%  Weight:      Height:       General exam: Appears calm and comfortable  Respiratory system: Clear to auscultation. Respiratory effort normal. Cardiovascular system: S1 & S2 heard, RRR. No JVD, murmurs, rubs, gallops or clicks. No pedal edema. Gastrointestinal system: Abdomen is nondistended, soft and nontender. No organomegaly or masses felt. Normal bowel sounds heard. Central nervous system: Alert and oriented x3. No focal neurological deficits. Extremities: Symmetric 5 x 5 power. Skin: No rashes, lesions or ulcers Psychiatry: Judgement and insight appear normal. Mood & affect appropriate.    Data Reviewed:  Lab results reviewed.  Family Communication: Wife updated at bedside.  Disposition: Status is: Inpatient Remains inpatient appropriate because: Severity of disease,     Time spent: 35 minutes  Author: Donaciano Frizzle, MD 07/03/2023 12:25 PM  For on call  review [email or web address].

## 2023-07-03 NOTE — Progress Notes (Signed)
 Progress Note    07/03/2023 2:29 PM 1 Day Post-Op  Subjective: Trevor Flores is a 63 year old male now status postop day 1 from a pulmonary thrombectomy and inferior vena cava filter placement.  Patient is resting comfortably with his wife at the bedside sitting in a chair.  Patient is currently getting 1 unit of packed red blood cells for hemoglobin of 7 this morning.  Patient endorses he feels much better and is breathing much better this morning.  He sitting in the chair without any supplemental oxygen.  No other complaints overnight.  Endorses not getting much sleep.  Vitals are remained stable.  Review of Systems  Constitutional:  Constitutional negative. Eyes: Eyes negative.  Respiratory: Positive for shortness of breath.  Cardiovascular: Positive for leg swelling.  GI: Gastrointestinal negative.  GU: Genitourinary negative. Musculoskeletal: Positive for leg pain.  Skin: Skin negative.  Neurological: Neurological negative. Psychiatric: Psychiatric negative.  All other systems reviewed and are negative  Vitals:   07/03/23 1245 07/03/23 1307  BP: 116/74 112/76  Pulse: 76 76  Resp: 20 20  Temp: 98.3 F (36.8 C) 98.5 F (36.9 C)  SpO2: 99% 99%   Physical Exam: Cardiac:  RRR, normal S1 and S2, no rubs clicks gallops or murmurs to note. Lungs: Lungs are rhonchorous throughout on auscultation.  Also decreased in the bases on auscultation.  Normal labored breathing today without any oxygen supplementation. Incisions: Right groin with dressing clean dry and intact.  No hematoma seroma to note. Extremities: Bilateral lower extremity +1-+2 edema due to bilateral lower extremity DVTs.  Unable to palpate pulses. Abdomen: Bowel sounds present throughout.  Soft, nontender and nondistended. Neurologic: Third and oriented x 3, follows commands and answers all questions appropriately.  CBC    Component Value Date/Time   WBC 6.7 07/03/2023 0357   RBC 2.78 (L) 07/03/2023 0357   HGB  7.0 (L) 07/03/2023 0357   HGB 15.0 12/05/2011 0955   HCT 23.1 (L) 07/03/2023 0357   HCT 43.2 12/05/2011 0955   PLT 338 07/03/2023 0357   PLT 228 12/05/2011 0955   MCV 83.1 07/03/2023 0357   MCV 84 12/05/2011 0955   MCH 25.2 (L) 07/03/2023 0357   MCHC 30.3 07/03/2023 0357   RDW 16.5 (H) 07/03/2023 0357   RDW 13.8 12/05/2011 0955   LYMPHSABS 0.5 (L) 06/29/2023 2046   MONOABS 0.8 06/29/2023 2046   EOSABS 0.0 06/29/2023 2046   BASOSABS 0.0 06/29/2023 2046    BMET    Component Value Date/Time   NA 140 07/03/2023 0357   NA 139 03/19/2023 0934   NA 142 12/05/2011 0955   K 3.5 07/03/2023 0357   K 3.7 12/05/2011 0955   CL 110 07/03/2023 0357   CL 107 12/05/2011 0955   CO2 20 (L) 07/03/2023 0357   CO2 27 12/05/2011 0955   GLUCOSE 74 07/03/2023 0357   GLUCOSE 95 12/05/2011 0955   BUN 17 07/03/2023 0357   BUN 16 03/19/2023 0934   BUN 14 12/05/2011 0955   CREATININE 1.00 07/03/2023 0357   CREATININE 1.38 (H) 12/05/2011 0955   CALCIUM  8.3 (L) 07/03/2023 0357   CALCIUM  9.0 12/05/2011 0955   GFRNONAA >60 07/03/2023 0357   GFRNONAA 59 (L) 12/05/2011 0955   GFRAA >60 02/22/2019 0633   GFRAA >60 12/05/2011 0955    INR    Component Value Date/Time   INR 1.5 (H) 06/29/2023 2046     Intake/Output Summary (Last 24 hours) at 07/03/2023 1429 Last data filed at 07/03/2023 0900  Gross per 24 hour  Intake 240 ml  Output 650 ml  Net -410 ml     Assessment/Plan:  63 y.o. male is s/p pulmonary thrombectomy with IVC filter placement.  1 Day Post-Op   PLAN Transition patient off of heparin  infusion to oral anticoagulation.  Vascular surgery recommends Eliquis  10 mg twice daily for 5 days then transition to Eliquis  5 mg twice daily.  Agree with blood transfusion of 1 unit of packed red blood cells for hemoglobin of 7.0. Spittle's plan is observe overnight if patient does well discharge home tomorrow.  Vascular surgery agrees with discharge tomorrow.  DVT prophylaxis: Eliquis  10 mg ice  daily   Annamaria Barrette [facility] 07/03/2023 2:29 PM

## 2023-07-03 NOTE — Progress Notes (Signed)
 PT Cancellation Note  Patient Details Name: Trevor Flores MRN: [id number] [name]: Feb 19, 1961   Cancelled Treatment:    Reason Eval/Treat Not Completed: Other (comment). Orders received and chart reviewed. Pern RN pt receiving blood transfusion. Asking PT to re-attempt when complete. PT to re-attempt at a later time/date as appropriate.    Marc Senior. Fairly IV, PT, DPT Physical Therapist- [place]  [facility]  07/03/2023, 1:41 PM

## 2023-07-03 NOTE — Evaluation (Signed)
 Occupational Therapy Evaluation Patient Details Name: Trevor Flores MRN: [id number] [name]: 1960/04/08 Today's Date: 07/03/2023   History of Present Illness   Trevor Flores is a 63 y.o. male with medical history significant for CKD, chronic anemia, HTN, recently metastatic diagnosed colon cancer status post recent port placement on 06/26/23. Pt presented to ED with a 1 month history of shortness of breath on 06/29/23. CT angiogram chest showed bilateral distal central pulmonary emboli, bilateral lower extremity ultrasound showed DVT. Pulmonary thrombectomy and IVC filter performed on 07/02/23.    Clinical Impressions Trevor Flores was seen for OT evaluation this date. Prior to hospital admission, pt was IND. Pt lives with spouse. Pt currently requires MOD A + RW sit<>stand x4 trials. MIN A + RW for ADL t/f ~10 ft + 68ft + 15 ft. MIN A don gown in sitting. Pt would benefit from skilled OT to address noted impairments and functional limitations (see below for any additional details). Upon hospital discharge, recommend OT follow up <3 hours/day however may progress to HHOT.   If plan is discharge home, recommend the following:   A little help with walking and/or transfers;A little help with bathing/dressing/bathroom;Help with stairs or ramp for entrance     Functional Status Assessment   Patient has had a recent decline in their functional status and demonstrates the ability to make significant improvements in function in a reasonable and predictable amount of time.     Equipment Recommendations   BSC/3in1;Other (comment) (RW)     Recommendations for Other Services         Precautions/Restrictions   Precautions Precautions: Fall Recall of Precautions/Restrictions: Impaired Restrictions Weight Bearing Restrictions Per Provider Order: No     Mobility Bed Mobility Overal bed mobility: Needs Assistance Bed Mobility: Supine to Sit     Supine to sit: Max assist           Transfers Overall transfer level: Needs assistance Equipment used: Rolling walker (2 wheels) Transfers: Sit to/from Stand, Bed to chair/wheelchair/BSC Sit to Stand: Mod assist     Step pivot transfers: Min assist            Balance Overall balance assessment: Needs assistance Sitting-balance support: No upper extremity supported, Feet supported Sitting balance-Leahy Scale: Good     Standing balance support: Bilateral upper extremity supported Standing balance-Leahy Scale: Fair                             ADL either performed or assessed with clinical judgement   ADL Overall ADL's : Needs assistance/impaired                                       General ADL Comments: MOD A + RW for simulated toilet t/f. MIN A don gown in sitting.      Pertinent Vitals/Pain Pain Assessment Pain Assessment: No/denies pain     Extremity/Trunk Assessment Upper Extremity Assessment Upper Extremity Assessment: Overall WFL for tasks assessed   Lower Extremity Assessment Lower Extremity Assessment: Generalized weakness       Communication Communication Communication: Impaired Factors Affecting Communication: Hearing impaired   Cognition Arousal: Alert Behavior During Therapy: WFL for tasks assessed/performed Cognition: No apparent impairments  Following commands: Impaired Following commands impaired: Follows one step commands with increased time     Cueing  General Comments   Cueing Techniques: Verbal cues;Tactile cues      Exercises     Shoulder Instructions      Home Living Family/patient expects to be discharged to:: Private residence Living Arrangements: Spouse/significant other Available Help at Discharge: Family;Available 24 hours/day Type of Home: House Home Access: Stairs to enter [employer] of Steps: 2 Entrance Stairs-Rails: Can reach both Home Layout: One level                Home Equipment: Agricultural consultant (2 wheels)          Prior Functioning/Environment Prior Level of Function : Independent/Modified Independent;Driving             Mobility Comments: community ambulator      OT Problem List: Decreased strength;Decreased range of motion;Impaired balance (sitting and/or standing);Decreased activity tolerance;Decreased safety awareness   OT Treatment/Interventions: Self-care/ADL training;Therapeutic exercise;Energy conservation;DME and/or AE instruction;Therapeutic activities;Patient/family education;Balance training      OT Goals(Current goals can be found in the care plan section)   Acute Rehab OT Goals Patient Stated Goal: to go home OT Goal Formulation: With patient/family Time For Goal Achievement: 07/17/23 Potential to Achieve Goals: Good ADL Goals Pt Will Perform Grooming: with modified independence;standing Pt Will Perform Lower Body Dressing: with modified independence;sit to/from stand Pt Will Transfer to Toilet: with modified independence;ambulating;regular height toilet   OT Frequency:  Min 3X/week    Co-evaluation              AM-PAC OT "6 Clicks" Daily Activity     Outcome Measure Help from another person eating meals?: None Help from another person taking care of personal grooming?: A Little Help from another person toileting, which includes using toliet, bedpan, or urinal?: A Lot Help from another person bathing (including washing, rinsing, drying)?: A Lot Help from another person to put on and taking off regular upper body clothing?: A Little Help from another person to put on and taking off regular lower body clothing?: A Lot 6 Click Score: 16   End of Session Equipment Utilized During Treatment: Gait belt;Rolling walker (2 wheels) Nurse Communication: Mobility status  Activity Tolerance: Patient tolerated treatment well Patient left: in chair;with call bell/phone within reach;with chair alarm  set;with family/visitor present  OT Visit Diagnosis: Unsteadiness on feet (R26.81);Muscle weakness (generalized) (M62.81)                Time: 4401-0272 OT Time Calculation (min): 39 min Charges:  OT General Charges $OT Visit: 1 Visit OT Evaluation $OT Eval Moderate Complexity: 1 Mod OT Treatments $Self Care/Home Management : 8-22 mins $Therapeutic Activity: 8-22 mins  Trevor Flores, M.S. OTR/L  07/03/23, 1:13 PM  ascom [phone]

## 2023-07-04 ENCOUNTER — Encounter: Payer: Self-pay | Admitting: Oncology

## 2023-07-04 ENCOUNTER — Encounter

## 2023-07-04 ENCOUNTER — Other Ambulatory Visit: Payer: Self-pay

## 2023-07-04 LAB — TYPE AND SCREEN
ABO/RH(D): O POS
Antibody Screen: NEGATIVE
Unit division: 0

## 2023-07-04 LAB — CBC
HCT: 26.2 % — ABNORMAL LOW (ref 39.0–52.0)
Hemoglobin: 8.2 g/dL — ABNORMAL LOW (ref 13.0–17.0)
MCH: 25.9 pg — ABNORMAL LOW (ref 26.0–34.0)
MCHC: 31.3 g/dL (ref 30.0–36.0)
MCV: 82.6 fL (ref 80.0–100.0)
Platelets: 306 10*3/uL (ref 150–400)
RBC: 3.17 MIL/uL — ABNORMAL LOW (ref 4.22–5.81)
RDW: 16.3 % — ABNORMAL HIGH (ref 11.5–15.5)
WBC: 8.3 10*3/uL (ref 4.0–10.5)
nRBC: 0.7 % — ABNORMAL HIGH (ref 0.0–0.2)

## 2023-07-04 LAB — BPAM RBC
Blood Product Expiration Date: 202505162359
ISSUE DATE / TIME: 202505011222
Unit Type and Rh: 5100

## 2023-07-04 MED ORDER — APIXABAN 5 MG PO TABS
ORAL_TABLET | ORAL | 0 refills | Status: DC
  Filled 2023-07-04: qty 72, 30d supply, fill #0

## 2023-07-04 MED ORDER — SODIUM BICARBONATE 325 MG PO TABS
650.0000 mg | ORAL_TABLET | Freq: Two times a day (BID) | ORAL | 0 refills | Status: AC
  Filled 2023-07-04: qty 28, 7d supply, fill #0

## 2023-07-04 NOTE — Telephone Encounter (Signed)
 Testing has completed, results given to provider and sent to scan.

## 2023-07-04 NOTE — Discharge Summary (Addendum)
 Physician Discharge Summary   Patient: Trevor Flores MRN: [id number] [name]: [birth year]  Admit date:     06/29/2023  Discharge date: 07/04/23  Discharge Physician: Donaciano Frizzle   PCP: Antonio Baumgarten, MD   Recommendations at discharge:   Follow-up with PCP in 1 week. Follow-up with vascular surgery in 2 weeks.  Discharge Diagnoses: Principal Problem:   Bilateral pulmonary embolism (HCC) Active Problems:   Elevated troponin   Adenocarcinoma of colon (HCC)   Essential hypertension   IDA (iron  deficiency anemia)   Stage 3a chronic kidney disease (HCC)   Acute pulmonary embolism (HCC)   Metabolic acidosis   Overweight (BMI 25.0-29.9)   Chronic deep vein thrombosis (DVT) of femoral vein of both lower extremities (HCC)   S/P insertion of IVC (inferior vena caval) filter  Resolved Problems:   * No resolved hospital problems. [facility] Course: From HPI "Trevor Flores is a 63 y.o. male with medical history significant for CKD, chronic anemia, HTN, recently metastatic diagnosed colon cancer status post recent port placement on 4/24 and with plans for liver biopsy on 4/29, being admitted with bilateral PE after presenting to the ED with a 1 month history of shortness of breath which acutely worsened on the day of arrival with O2 sats at home 90 to 93%.    CT angiogram chest showed bilateral distal central pulmonary emboli, bilateral lower extremity ultrasound showed DVT.  Patient was placed on heparin  drip.  Pulmonary thrombectomy and IVC filter performed on 4/30.  Transitioned to Eliquis .  Condition continued to be stable, medically stable for discharge.  Assessment and Plan: Bilateral pulmonary embolism (HCC) Bilateral lower extremity occlusive DVT Elevated troponin secondary to demand ischemia from PE. Patient is status post pulmonary thrombectomy and IVC filter placement.  Patient currently on heparin , based on literature search, will transition anticoagulation to Eliquis   4/30. Echo showing EF 65 to 70% with grade 1 diastolic dysfunction Patient doing well, medically stable for discharge. Due to DVT/PE associate with a malignancy, patient need lifetime anticoagulation.   Adenocarcinoma of colon [facility]) Recently diagnosed Outpatient follow-up with oncology    stage 3a chronic kidney disease (HCC) Acute kidney injury ruled out. Metabolic acidosis. 5/1. Renal function at baseline, continue sodium bicarb. Potassium 3.5, give 40 mEq of potassium oral. 5/2.  Continue sodium bicarb for 1 week.     IDA (iron  deficiency anemia) No Evidence of acute bleeding, received a dose of IV iron .  B12 level elevated. Hemoglobin dropped down to 7.0 today, patient does not have additional bleeding from surgery site, denies any black stool or rectal bleeding.  Patient received 1 unit PRBC 5/1.  Currently increased to 8.3.  Patient need to follow-up with oncology as outpatient.   Essential hypertension Continue losartan  and metoprolol    Parkinson disease. Continue home medicines.       Consultants: Vascular Procedures performed: Pulmonary thrombectomy and IVC filter placement. Disposition: Home health Diet recommendation:  Discharge Diet Orders (From admission, onward)     Start     Ordered   07/04/23 0000  Diet - low sodium heart healthy        07/04/23 1007           Cardiac diet DISCHARGE MEDICATION: Allergies as of 07/04/2023       Reactions   Other    Hydralazine  Itching, Palpitations        Medication List     STOP taking these medications    aspirin  EC 81 MG tablet  HYDROcodone -acetaminophen  5-325 MG tablet Commonly known as: NORCO/VICODIN   lidocaine -prilocaine  cream Commonly known as: EMLA    meclizine  25 MG tablet Commonly known as: ANTIVERT    meloxicam  15 MG tablet Commonly known as: MOBIC    methocarbamol  500 MG tablet Commonly known as: Robaxin    prochlorperazine  10 MG tablet Commonly known as: COMPAZINE        TAKE  these medications    albuterol  108 (90 Base) MCG/ACT inhaler Commonly known as: VENTOLIN  HFA Inhale 1-2 puffs into the lungs every 4 (four) hours as needed.   allopurinol 100 MG tablet Commonly known as: ZYLOPRIM Take 100 mg by mouth daily.   apixaban  5 MG Tabs tablet Commonly known as: ELIQUIS  Take 2 tablets (10 mg total) by mouth 2 (two) times daily for 6 days, THEN 1 tablet (5 mg total) 2 (two) times daily. Start taking on: Jul 04, 2023   carbidopa -levodopa  25-100 MG tablet Commonly known as: SINEMET  IR Take 1 tablet by mouth 3 (three) times daily.   diltiazem  360 MG 24 hr capsule Commonly known as: TIAZAC  Take 1 capsule (360 mg total) by mouth daily.   doxazosin  1 MG tablet Commonly known as: CARDURA  TAKE 1 TABLET BY MOUTH DAILY AS NEEDED FOR PRESSURE GREATER THAN 150   DSS 100 MG Caps Take 1 capsule by mouth 2 (two) times daily.   fluticasone 50 MCG/ACT nasal spray Commonly known as: FLONASE Place into both nostrils daily as needed for allergies or rhinitis.   loratadine 10 MG tablet Commonly known as: CLARITIN Take 10 mg by mouth daily as needed for allergies.   losartan  100 MG tablet Commonly known as: COZAAR  TAKE 1 TABLET(100 MG) BY MOUTH DAILY IN THE MORNING   metoprolol  succinate 25 MG 24 hr tablet Commonly known as: TOPROL -XL TAKE 1 TABLET(25 MG) BY MOUTH EVERY EVENING WITH FOOD What changed: See the new instructions.   ondansetron  8 MG tablet Commonly known as: Zofran  Take 1 tablet (8 mg total) by mouth every 8 (eight) hours as needed for nausea or vomiting. Start on the third day after chemotherapy.   oxyCODONE  5 MG immediate release tablet Commonly known as: Oxy IR/ROXICODONE  Take 1 tablet (5 mg total) by mouth every 6 (six) hours as needed for severe pain (pain score 7-10) or moderate pain (pain score 4-6). What changed: how much to take   pantoprazole  40 MG tablet Commonly known as: Protonix  Take 1 tablet (40 mg total) by mouth daily.    polyethylene glycol powder 17 GM/SCOOP powder Commonly known as: GLYCOLAX /MIRALAX  Take 17 g by mouth daily as needed.   rosuvastatin  5 MG tablet Commonly known as: CRESTOR  Take 5 mg by mouth daily.   senna-docusate 8.6-50 MG tablet Commonly known as: Senokot-S Take 2 tablets by mouth daily.   sildenafil 25 MG tablet Commonly known as: VIAGRA Take 25 mg by mouth as needed.   sodium bicarbonate  650 MG tablet Take 1 tablet (650 mg total) by mouth 2 (two) times daily for 7 days.   Spiriva HandiHaler 18 MCG inhalation capsule Generic drug: tiotropium Place 18 mcg into inhaler and inhale daily.   Vitamin D (Ergocalciferol) 1.25 MG (50000 UNIT) Caps capsule Commonly known as: DRISDOL Take 50,000 Units by mouth once a week.        Follow-up Information     Antonio Baumgarten, MD Follow up in 1 week(s).   Specialty: Internal Medicine Why: Hospital follow up Contact information: [address] [place] [address] [phone]  Celso College, MD Follow up in 2 week(s).   Specialties: Vascular Surgery, Radiology, Interventional Cardiology Contact information: [address] [place] [address] [phone]                Discharge Exam: Cleavon Curls Weights   06/29/23 2017  Weight: 93 kg   General exam: Appears calm and comfortable  Respiratory system: Clear to auscultation. Respiratory effort normal. Cardiovascular system: S1 & S2 heard, RRR. No JVD, murmurs, rubs, gallops or clicks. No pedal edema. Gastrointestinal system: Abdomen is nondistended, soft and nontender. No organomegaly or masses felt. Normal bowel sounds heard. Central nervous system: Alert and oriented. No focal neurological deficits. Extremities: Symmetric 5 x 5 power. Skin: No rashes, lesions or ulcers Psychiatry: Judgement and insight appear normal. Mood & affect appropriate.    Condition at discharge: good  The results of significant  diagnostics from this hospitalization (including imaging, microbiology, ancillary and laboratory) are listed below for reference.   Imaging Studies: PERIPHERAL VASCULAR CATHETERIZATION Result Date: 07/02/2023 See surgical note for result.  ECHOCARDIOGRAM COMPLETE Result Date: 06/30/2023    ECHOCARDIOGRAM REPORT   Patient Name:   JADARIEN BRISK Date of Exam: 06/30/2023 Medical Rec #:  [id number]       Height:       72.0 in Accession #:    [id number]      Weight:       205.0 lb Date of Birth:  03-Sep-1960       BSA:          2.153 m Patient Age:    63 years        BP:           112/80 mmHg Patient Gender: M               HR:           59 bpm. Exam Location:  [facility] Procedure: 2D Echo, Cardiac Doppler and Color Doppler (Both Spectral and Color            Flow Doppler were utilized during procedure). Indications:     Congestive heart failure I50.9  History:         Patient has prior history of Echocardiogram examinations, most                  recent 04/04/2023. CHF, CAD, Arrythmias:LBBB; Risk                  Factors:Hypertension.  Sonographer:     Broadus Canes Referring Phys:  [id number] Lanetta Pion Diagnosing Phys: Antionette Kirks MD  Sonographer Comments: Suboptimal apical window. Image acquisition challenging due to respiratory motion. IMPRESSIONS  1. Left ventricular ejection fraction, by estimation, is 65 to 70%. The left ventricle has normal function. The left ventricle has no regional wall motion abnormalities. There is mild left ventricular hypertrophy. Left ventricular diastolic parameters are consistent with Grade I diastolic dysfunction (impaired relaxation).  2. Right ventricular systolic function is normal. The right ventricular size is normal. Tricuspid regurgitation signal is inadequate for assessing PA pressure.  3. The mitral valve is normal in structure. No evidence of mitral valve regurgitation. No evidence of mitral stenosis.  4. The aortic valve is normal in structure. Aortic valve regurgitation is  not visualized. Aortic valve sclerosis is present, with no evidence of aortic valve stenosis. FINDINGS  Left Ventricle: Left ventricular ejection fraction, by estimation, is 65 to 70%. The left ventricle has normal function. The left  ventricle has no regional wall motion abnormalities. The left ventricular internal cavity size was normal in size. There is  mild left ventricular hypertrophy. Left ventricular diastolic parameters are consistent with Grade I diastolic dysfunction (impaired relaxation). Right Ventricle: The right ventricular size is normal. No increase in right ventricular wall thickness. Right ventricular systolic function is normal. Tricuspid regurgitation signal is inadequate for assessing PA pressure. Left Atrium: Left atrial size was normal in size. Right Atrium: Right atrial size was normal in size. Pericardium: There is no evidence of pericardial effusion. Mitral Valve: The mitral valve is normal in structure. No evidence of mitral valve regurgitation. No evidence of mitral valve stenosis. Tricuspid Valve: The tricuspid valve is normal in structure. Tricuspid valve regurgitation is not demonstrated. No evidence of tricuspid stenosis. Aortic Valve: The aortic valve is normal in structure. Aortic valve regurgitation is not visualized. Aortic valve sclerosis is present, with no evidence of aortic valve stenosis. Aortic valve mean gradient measures 2.5 mmHg. Aortic valve peak gradient measures 4.5 mmHg. Aortic valve area, by VTI measures 3.16 cm. Pulmonic Valve: The pulmonic valve was normal in structure. Pulmonic valve regurgitation is trivial. No evidence of pulmonic stenosis. Aorta: The aortic root is normal in size and structure. Venous: The inferior vena cava was not well visualized. IAS/Shunts: No atrial level shunt detected by color flow Doppler.  LEFT VENTRICLE PLAX 2D LVIDd:         4.60 cm   Diastology LVIDs:         2.80 cm   LV e' medial:    6.96 cm/s LV PW:         1.00 cm   LV E/e'  medial:  8.2 LV IVS:        1.20 cm   LV e' lateral:   9.57 cm/s LVOT diam:     2.10 cm   LV E/e' lateral: 6.0 LV SV:         62 LV SV Index:   29 LVOT Area:     3.46 cm  RIGHT VENTRICLE RV Basal diam:  2.80 cm RV Mid diam:    2.50 cm LEFT ATRIUM           Index        RIGHT ATRIUM          Index LA diam:      4.90 cm 2.28 cm/m   RA Area:     8.66 cm LA Vol (A4C): 41.9 ml 19.46 ml/m  RA Volume:   16.40 ml 7.62 ml/m  AORTIC VALVE AV Area (Vmax):    3.06 cm AV Area (Vmean):   2.92 cm AV Area (VTI):     3.16 cm AV Vmax:           106.50 cm/s AV Vmean:          71.850 cm/s AV VTI:            0.197 m AV Peak Grad:      4.5 mmHg AV Mean Grad:      2.5 mmHg LVOT Vmax:         94.20 cm/s LVOT Vmean:        60.500 cm/s LVOT VTI:          0.180 m LVOT/AV VTI ratio: 0.91  AORTA Ao Root diam: 3.50 cm MITRAL VALVE                TRICUSPID VALVE MV Area (PHT): 4.26 cm  TR Peak grad:   11.2 mmHg MV Decel Time: 178 msec     TR Vmax:        167.00 cm/s MV E velocity: 57.10 cm/s MV A velocity: 113.00 cm/s  SHUNTS MV E/A ratio:  0.51         Systemic VTI:  0.18 m                             Systemic Diam: 2.10 cm Antionette Kirks MD Electronically signed by Antionette Kirks MD Signature Date/Time: 06/30/2023/2:28:36 PM    Final    US  Venous Img Lower Bilateral (DVT) Addendum Date: 06/30/2023 ADDENDUM REPORT: 06/30/2023 08:56 ADDENDUM: Regarding the right lower extremity, thrombus appears occlusive in the femoral vein. In the left lower extremity, thrombus appears occlusive in the profunda and calf veins. Critical Value/emergent results were called by telephone at the time of interpretation on 06/30/2023 at 8:56 am to provider Dr. Mariella Shore, who verbally acknowledged these results. Electronically Signed   By: Donnal Fusi M.D.   On: 06/30/2023 08:56   Result Date: 06/30/2023 CLINICAL DATA:  Pulmonary embolus. Lower extremity edema for 3 weeks. EXAM: BILATERAL LOWER EXTREMITY VENOUS DOPPLER ULTRASOUND TECHNIQUE: Gray-scale  sonography with graded compression, as well as color Doppler and duplex ultrasound were performed to evaluate the lower extremity deep venous systems from the level of the common femoral vein and including the common femoral, femoral, profunda femoral, popliteal and calf veins including the posterior tibial, peroneal and gastrocnemius veins when visible. The superficial great saphenous vein was also interrogated. Spectral Doppler was utilized to evaluate flow at rest and with distal augmentation maneuvers in the common femoral, femoral and popliteal veins. COMPARISON:  None Available. FINDINGS: RIGHT LOWER EXTREMITY Common Femoral Vein: Positive for thrombus. Saphenofemoral Junction: Positive for thrombus. Profunda Femoral Vein: Positive for thrombus. Femoral Vein: Positive for thrombus. Popliteal Vein: Positive for thrombus. Calf Veins: Positive for thrombus. Other Findings:  None. LEFT LOWER EXTREMITY Common Femoral Vein: No evidence of thrombus. Normal compressibility, respiratory phasicity and response to augmentation. Saphenofemoral Junction: No evidence of thrombus. Normal compressibility and flow on color Doppler imaging. Profunda Femoral Vein: Positive for thrombus. Femoral Vein: Positive for thrombus. Popliteal Vein: No evidence of thrombus. Normal compressibility, respiratory phasicity and response to augmentation. Calf Veins: Positive for thrombus. Other Findings:  None. IMPRESSION: Exam is positive for extensive bilateral lower extremity deep venous thrombosis in this patient with a known history of pulmonary embolus. Electronically Signed: By: Donnal Fusi M.D. On: 06/30/2023 08:40   CT Angio Chest PE W and/or Wo Contrast Result Date: 06/29/2023 CLINICAL DATA:  Pulmonary embolism (PE) suspected, high prob. Pt c/o SOB x 1 month, denies cough and fevers. Pt reports recently being dx with colon cancer EXAM: CT ANGIOGRAPHY CHEST WITH CONTRAST TECHNIQUE: Multidetector CT imaging of the chest was  performed using the standard protocol during bolus administration of intravenous contrast. Multiplanar CT image reconstructions and MIPs were obtained to evaluate the vascular anatomy. RADIATION DOSE REDUCTION: This exam was performed according to the departmental dose-optimization program which includes automated exposure control, adjustment of the mA and/or kV according to patient size and/or use of iterative reconstruction technique. CONTRAST:  75mL OMNIPAQUE  IOHEXOL  350 MG/ML SOLN COMPARISON:  CT chest abdomen pelvis 06/12/2023 FINDINGS: Cardiovascular: Right chest wall Port-A-Cath with tip terminating within the superior vena cava. Satisfactory opacification of the pulmonary arteries to the segmental level. Bilateral distal central left and right interlobar artery pulmonary  emboli extending to the bilateral lower lobes of the segmental and subsegmental levels. Limited evaluation of the subsegmental level due to timing of contrast and motion artifact. Normal heart size. No significant pericardial effusion. The thoracic aorta is normal in caliber. Mild atherosclerotic plaque of the thoracic aorta. At least three-vessel coronary artery calcifications. Mediastinum/Nodes: No enlarged mediastinal, hilar, or axillary lymph nodes. Thyroid gland, trachea, and esophagus demonstrate no significant findings. Lungs/Pleura: Right lower lobe basilar peribronchovascular peripheral ground-glass airspace opacities and slightly more consolidative peripheral consolidation.No pulmonary nodule. No pulmonary mass. No pleural effusion. No pneumothorax. Upper Abdomen: Poorly visualized known hepatic hypodense lesions. Musculoskeletal: No chest wall abnormality. No suspicious lytic or blastic osseous lesions. No acute displaced fracture. Review of the MIP images confirms the above findings. IMPRESSION: 1. Distal central left and right interlobar pulmonary emboli extending to the bilateral lower lobes of the segmental and subsegmental  levels. Extensive pulmonary embolus of the right lower lobe. Right lower lobe basilar finding may represent developing pulmonary infarction versus infection. No right heart strain. 2. Poorly visualized known hepatic metastases. These results were called by telephone at the time of interpretation on 06/29/2023 at 10:55 pm to provider [facility] , who verbally acknowledged these results. Electronically Signed   By: Morgane  Naveau M.D.   On: 06/29/2023 22:57   DG Chest Port 1 View Result Date: 06/29/2023 CLINICAL DATA:  [id number] SOB (shortness of breath) [id number] EXAM: PORTABLE CHEST 1 VIEW COMPARISON:  Chest x-ray 06/26/2023 FINDINGS: Right chest wall Port-A-Cath with tip overlying the expected region of the distal superior vena cava. The heart and mediastinal contours are unchanged. Atherosclerotic plaque. No focal consolidation. No pulmonary edema. No pleural effusion. No pneumothorax. No acute osseous abnormality. IMPRESSION: 1. No active disease. 2.  Aortic Atherosclerosis (ICD10-I70.0). Electronically Signed   By: Morgane  Naveau M.D.   On: 06/29/2023 21:51   DG Chest Port 1 View Result Date: 06/26/2023 CLINICAL DATA:  Port-A-Cath placement EXAM: PORTABLE CHEST 1 VIEW COMPARISON:  February 28, 2023 FINDINGS: The heart size and mediastinal contours are within normal limits. Both lungs are clear. The visualized skeletal structures are unremarkable. Tip of the right IJ Infuse-A-Port catheter in the superior vena cava. No pneumothorax. IMPRESSION: No acute cardiopulmonary process. Electronically Signed   By: Fredrich Jefferson M.D.   On: 06/26/2023 14:20   DG C-Arm 1-60 Min-No Report Result Date: 06/26/2023 Fluoroscopy was utilized by the requesting physician.  No radiographic interpretation.   CT CHEST ABDOMEN PELVIS W CONTRAST Result Date: 06/12/2023 CLINICAL DATA:  Descending colonic mass, metastatic disease evaluation * Tracking Code: BO * EXAM: CT CHEST, ABDOMEN, AND PELVIS WITH CONTRAST TECHNIQUE:  Multidetector CT imaging of the chest, abdomen and pelvis was performed following the standard protocol during bolus administration of intravenous contrast. RADIATION DOSE REDUCTION: This exam was performed according to the departmental dose-optimization program which includes automated exposure control, adjustment of the mA and/or kV according to patient size and/or use of iterative reconstruction technique. CONTRAST:  100mL ISOVUE -300 IOPAMIDOL  (ISOVUE -300) INJECTION 61% COMPARISON:  None Available. FINDINGS: Examination is generally limited by dense streak artifact related to patient arm positioning. CT CHEST FINDINGS Cardiovascular: Aortic atherosclerosis. Normal heart size. Left and right coronary artery calcifications no pericardial effusion. Mediastinum/Nodes: No enlarged mediastinal, hilar, or axillary lymph nodes. Thyroid gland, trachea, and esophagus demonstrate no significant findings. Lungs/Pleura: Lungs are clear. No pleural effusion or pneumothorax. Musculoskeletal: No chest wall abnormality. No acute osseous findings. CT ABDOMEN PELVIS FINDINGS Hepatobiliary: Numerous bulky hypodense liver lesions of varying  sizes seen throughout the liver, large index lesion of the inferior right lobe measuring 5.6 x 5.6 cm (series 4, image 56). No gallstones, gallbladder wall thickening, or biliary dilatation. Pancreas: Unremarkable. No pancreatic ductal dilatation or surrounding inflammatory changes. Spleen: Normal in size without significant abnormality. Adrenals/Urinary Tract: Adrenal glands are unremarkable. Multiple benign bilateral renal cortical cysts, requiring no specific further follow-up or characterization. No calculi or hydronephrosis. Bladder is unremarkable. Stomach/Bowel: Stomach is within normal limits. Large, circumferential mass of the very redundant mid sigmoid colon, measuring 6.5 cm in length and 4.2 x 4.0 cm in diameter (series 8, image 116, series 4, image 91) Vascular/Lymphatic: Aortic  atherosclerosis. Enlarged lymph nodes or metastatic soft tissue mass in the sigmoid mesocolon measuring 2.2 x 2.2 cm (series 4, image 88). Enlarged left iliac lymph node measuring 2.0 x 1.7 cm (series 4, image 62). Reproductive: No mass or other abnormality. Other: No abdominal wall hernia or abnormality. No ascites. Musculoskeletal: No acute osseous findings. IMPRESSION: 1. Large, circumferential mass of the very redundant mid sigmoid colon, measuring 6.5 cm in length and 4.2 x 4.0 cm in diameter, consistent with primary colonic malignancy. 2. Enlarged lymph nodes or metastatic soft tissue mass in the sigmoid mesocolon as well as an enlarged left iliac lymph node, consistent with nodal metastatic disease. 3. Numerous bulky hypodense liver lesions of varying sizes seen throughout the liver, consistent with hepatic metastatic disease. 4. No evidence of lymphadenopathy or metastatic disease in the chest. 5. Coronary artery disease. These results will be called to the ordering clinician or representative by the Radiologist Assistant, and communication documented in the PACS or [employer]. Aortic Atherosclerosis (ICD10-I70.0). Electronically Signed   By: Fredricka Jenny M.D.   On: 06/12/2023 14:53    Microbiology: Results for orders placed or performed during the hospital encounter of 02/22/19  Urine culture     Status: None   Collection Time: 02/22/19  7:59 AM   Specimen: Urine, Random  Result Value Ref Range Status   Specimen Description   Final    URINE, RANDOM Performed at [facility], [address], [place], [place] [address]    Special Requests   Final    Normal Performed at [facility], [address] [place], [place] [address]    Culture   Final    NO GROWTH Performed at [facility], [address], [place], [place] [address]    Report Status 02/23/2019 FINAL  Final    Labs: CBC: Recent Labs  Lab 06/29/23 2046 07/01/23 0234 07/02/23 0435  07/03/23 0357 07/04/23 0711  WBC 9.7 11.0* 8.7 6.7 8.3  NEUTROABS 8.3*  --   --   --   --   HGB 8.4* 7.5* 7.5* 7.0* 8.2*  HCT 27.5* 24.1* 24.9* 23.1* 26.2*  MCV 83.8 80.9 83.0 83.1 82.6  PLT 212 276 350 338 306   Basic Metabolic Panel: Recent Labs  Lab 06/29/23 2046 07/01/23 0957 07/02/23 0435 07/03/23 0357  NA 136 139 140 140  K 3.8 3.7 3.6 3.5  CL 102 107 108 110  CO2 18* 19* 20* 20*  GLUCOSE 102* 85 83 74  BUN 22 20 20 17   CREATININE 1.50* 1.18 1.17 1.00  CALCIUM  8.8* 8.6* 8.6* 8.3*  MG  --   --   --  2.2   Liver Function Tests: Recent Labs  Lab 06/29/23 2046  AST 55*  ALT 17  ALKPHOS 170*  BILITOT 0.6  PROT 8.0  ALBUMIN 3.1*  CBG: No results for input(s): "GLUCAP" in the last 168 hours.  Discharge time spent: greater than 30 minutes.  Signed: Donaciano Frizzle, MD [facility] 07/04/2023

## 2023-07-04 NOTE — Evaluation (Signed)
 Physical Therapy Evaluation Patient Details Name: Trevor Flores MRN: [id number] [name]: 03-Oct-1960 Today's Date: 07/04/2023  History of Present Illness  Trevor Flores is a 63 y.o. male with medical history significant for CKD, chronic anemia, HTN, recently metastatic diagnosed colon cancer status post recent port placement on 06/26/23. Pt presented to ED with a 1 month history of shortness of breath on 06/29/23. CT angiogram chest showed bilateral distal central pulmonary emboli, bilateral lower extremity ultrasound showed DVT. Pulmonary thrombectomy and IVC filter performed on 07/02/23.   Clinical Impression  Pt admitted with above diagnosis. Pt currently with functional limitations due to the deficits listed below (see PT Problem List). Pt received upright in bed agreeable to PT services. Pt reports PTA being fully independent and lives with spouse who can assist as needed.   To date, pt reliant on minA for bed mobility needing bed features and multi modal cuing to assist. Pt reports sleeping in his lift chair most of the time so hard to say if acute weakness or baseline functional capacity lends to need of minA today. Pt is able to stand CGA from EOB and ambulate 120' with chair follow. X1 standing rest break for MD updates in hallway and check lung sounds then return back to room. Pt does rely heavily on BUE support on RW needing VC's for improving foot clearance in swing phase with fair carryover. Is able to initiate increased gait cadence indicative of adequate power production for walking tasks for household distances. Pt returns to recliner with all needs in reach. Discussed using RW for reduced falls risk and maximizing independence with functional activities in household. Pt and spouse understanding. Pt will benefit from skilled PT services to address these deficits and maximize return to PLOF.     If plan is discharge home, recommend the following: A little help with walking and/or  transfers;Assistance with cooking/housework;Assist for transportation;Help with stairs or ramp for entrance;A little help with bathing/dressing/bathroom   Can travel by private vehicle        Equipment Recommendations None recommended by PT  Recommendations for Other Services       Functional Status Assessment Patient has had a recent decline in their functional status and demonstrates the ability to make significant improvements in function in a reasonable and predictable amount of time.     Precautions / Restrictions Precautions Precautions: Fall Recall of Precautions/Restrictions: Impaired Restrictions Weight Bearing Restrictions Per Provider Order: No      Mobility  Bed Mobility Overal bed mobility: Needs Assistance Bed Mobility: Supine to Sit     Supine to sit: HOB elevated, Used rails, Min assist     General bed mobility comments: VC's for hand placement. Patient Response: Cooperative, Flat affect  Transfers Overall transfer level: Needs assistance Equipment used: Rolling walker (2 wheels) Transfers: Sit to/from Stand Sit to Stand: Contact guard assist                Ambulation/Gait Ambulation/Gait assistance: Contact guard assist, Supervision Gait Distance (Feet): 120 Feet Assistive device: Rolling walker (2 wheels) Gait Pattern/deviations: Step-to pattern, Decreased dorsiflexion - left, Decreased dorsiflexion - right, Narrow base of support       General Gait Details: chair follow and limited step lengths and foot clearance.  Stairs            Wheelchair Mobility     Tilt Bed Tilt Bed Patient Response: Cooperative, Flat affect  Modified Rankin (Stroke Patients Only)       Balance Overall  balance assessment: Needs assistance Sitting-balance support: No upper extremity supported, Feet supported Sitting balance-Leahy Scale: Good     Standing balance support: Bilateral upper extremity supported Standing balance-Leahy Scale: Fair                                Pertinent Vitals/Pain Pain Assessment Pain Assessment: No/denies pain    Home Living Family/patient expects to be discharged to:: Private residence Living Arrangements: Spouse/significant other Available Help at Discharge: Family;Available 24 hours/day Type of Home: House Home Access: Stairs to enter Entrance Stairs-Rails: Can reach both Entrance Stairs-Number of Steps: 2   Home Layout: One level Home Equipment: Agricultural consultant (2 wheels);BSC/3in1;Tub bench      Prior Function Prior Level of Function : Independent/Modified Independent;Driving             Mobility Comments: Tourist information centre manager       Extremity/Trunk Assessment   Upper Extremity Assessment Upper Extremity Assessment: Overall WFL for tasks assessed    Lower Extremity Assessment Lower Extremity Assessment: Generalized weakness    Cervical / Trunk Assessment Cervical / Trunk Assessment: Normal  Communication   Communication Communication: Impaired Factors Affecting Communication: Hearing impaired    Cognition Arousal: Alert Behavior During Therapy: WFL for tasks assessed/performed   PT - Cognitive impairments: No apparent impairments                         Following commands: Impaired Following commands impaired: Follows one step commands with increased time     Cueing Cueing Techniques: Verbal cues, Tactile cues     General Comments General comments (skin integrity, edema, etc.): HR low 100's    Exercises     Assessment/Plan    PT Assessment Patient needs continued PT services  PT Problem List Decreased strength;Decreased mobility;Decreased activity tolerance;Decreased balance       PT Treatment Interventions DME instruction;Therapeutic exercise;Gait training;Balance training;Stair training;Neuromuscular re-education;Functional mobility training;Therapeutic activities;Patient/family education    PT Goals (Current goals can be  found in the Care Plan section)  Acute Rehab PT Goals Patient Stated Goal: to go home PT Goal Formulation: With patient/family Time For Goal Achievement: 07/18/23 Potential to Achieve Goals: Good    Frequency Min 2X/week     Co-evaluation               AM-PAC PT "6 Clicks" Mobility  Outcome Measure Help needed turning from your back to your side while in a flat bed without using bedrails?: A Lot Help needed moving from lying on your back to sitting on the side of a flat bed without using bedrails?: A Lot Help needed moving to and from a bed to a chair (including a wheelchair)?: A Little Help needed standing up from a chair using your arms (e.g., wheelchair or bedside chair)?: A Little Help needed to walk in hospital room?: A Little Help needed climbing 3-5 steps with a railing? : A Little 6 Click Score: 16    End of Session Equipment Utilized During Treatment: Gait belt Activity Tolerance: Patient tolerated treatment well Patient left: in chair;with call bell/phone within reach;with family/visitor present Nurse Communication: Mobility status PT Visit Diagnosis: Other abnormalities of gait and mobility (R26.89);Muscle weakness (generalized) (M62.81)    Time: 4098-1191 PT Time Calculation (min) (ACUTE ONLY): 25 min   Charges:   PT Evaluation $PT Eval Moderate Complexity: 1 Mod PT Treatments $Gait Training: 8-22 mins PT General Charges $$  ACUTE PT VISIT: 1 Visit         Marc Senior. Fairly IV, PT, DPT Physical Therapist- [place]  [facility]  07/04/2023, 10:03 AM

## 2023-07-04 NOTE — Discharge Instructions (Signed)
 [facility] They will call you to set up when they are coming out to see you   [address], [place], [place] [address] Hours:  Open ? Closes 5?PM Phone: [phone]        Instructions after Total Knee Replacement   Reinaldo Berber M.D.     Dept. of Orthopaedics & Sports Medicine  [facility]  [address]  [place], [place]  [address]  Phone: [phone]   Fax: [phone]    DIET: Drink plenty of non-alcoholic fluids. Resume your normal diet. Include foods high in fiber.  ACTIVITY:  You may use crutches or a walker with weight-bearing as tolerated, unless instructed otherwise. You may be weaned off of the walker or crutches by your Physical Therapist.  Do NOT place pillows under the knee. Anything placed under the knee could limit your ability to straighten the knee.   Continue doing gentle exercises. Exercising will reduce the pain and swelling, increase motion, and prevent muscle weakness.   Please continue to use the TED compression stockings for 2 weeks. You may remove the stockings at night, but should reapply them in the morning. Do not drive or operate any equipment until instructed.  WOUND CARE:  Continue to use the PolarCare or ice packs periodically to reduce pain and swelling. You may begin showering 3 days after surgery with honeycomb dressing. Remove honeycomb dressing 7 days after surgery and continue showering. Allow dermabond to fall off on its own.  MEDICATIONS: You may resume your regular medications. Please take the pain medication as prescribed on the medication. Do not take pain medication on an empty stomach. You have been given a prescription for a blood thinner (Lovenox or Coumadin). Please take the medication as instructed. (NOTE: After completing a 2 week course of Lovenox, take one 81 mg Enteric-coated aspirin twice a day for 3 additional weeks. This along with elevation will help reduce the possibility of phlebitis in your operated  leg.) Do not drive or drink alcoholic beverages when taking pain medications.  POSTOPERATIVE CONSTIPATION PROTOCOL Constipation - defined medically as fewer than three stools per week and severe constipation as less than one stool per week.  One of the most common issues patients have following surgery is constipation.  Even if you have a regular bowel pattern at home, your normal regimen is likely to be disrupted due to multiple reasons following surgery.  Combination of anesthesia, postoperative narcotics, change in appetite and fluid intake all can affect your bowels.  In order to avoid complications following surgery, here are some recommendations in order to help you during your recovery period.  Colace (docusate) - Pick up an over-the-counter form of Colace or another stool softener and take twice a day as long as you are requiring postoperative pain medications.  Take with a full glass of water daily.  If you experience loose stools or diarrhea, hold the colace until you stool forms back up.  If your symptoms do not get better within 1 week or if they get worse, check with your doctor.  Dulcolax (bisacodyl) - Pick up over-the-counter and take as directed by the product packaging as needed to assist with the movement of your bowels.  Take with a full glass of water.  Use this product as needed if not relieved by Colace only.   MiraLax (polyethylene glycol) - Pick up over-the-counter to have on hand.  MiraLax is a solution that will increase the amount of water in your bowels to assist with  bowel movements.  Take as directed and can mix with a glass of water, juice, soda, coffee, or tea.  Take if you go more than two days without a movement. Do not use MiraLax more than once per day. Call your doctor if you are still constipated or irregular after using this medication for 7 days in a row.  If you continue to have problems with postoperative constipation, please contact the office for further  assistance and recommendations.  If you experience "the worst abdominal pain ever" or develop nausea or vomiting, please contact the office immediatly for further recommendations for treatment.   CALL THE OFFICE FOR: Temperature above 101 degrees Excessive bleeding or drainage on the dressing. Excessive swelling, coldness, or paleness of the toes. Persistent nausea and vomiting.  FOLLOW-UP:  You should have an appointment to return to the office in 14 days after surgery. Arrangements have been made for continuation of Physical Therapy (either home therapy or outpatient therapy).

## 2023-07-04 NOTE — TOC Transition Note (Signed)
 Transition of Care [facility]) - Discharge Note   Patient Details  Name: Trevor Flores MRN: [id number] Date of Birth: May 05, 1960  [facility] CM/SW Contact:  Loman Risk, RN Phone Number: 07/04/2023, 10:29 AM   Clinical Narrative:     Patient to discharge today Met with patient and wife at bedside PT recommending home health.  Patient in agreement.  Patient states that he does not have a preference of home health agency.  Referral made and accepted by Shaun with [facility].   Added [facility] contact information to AVS.  Wife states that she will be transporting at discharge    Barriers to Discharge: Continued Medical Work up   Patient Goals and CMS Choice            Discharge Placement                       Discharge Plan and Services Additional resources added to the After Visit Summary for                                       Social Drivers of Health (SDOH) Interventions SDOH Screenings   Food Insecurity: No Food Insecurity (06/30/2023)  Housing: Low Risk  (06/30/2023)  Transportation Needs: No Transportation Needs (06/30/2023)  Utilities: Not At Risk (06/30/2023)  Financial Resource Strain: Low Risk  (06/24/2023)   Received from [facility]  Social Connections: Unknown (08/09/2022)   Received from [facility]  Tobacco Use: Medium Risk (06/30/2023)     Readmission Risk Interventions     No data to display

## 2023-07-07 ENCOUNTER — Inpatient Hospital Stay: Admitting: Oncology

## 2023-07-07 ENCOUNTER — Inpatient Hospital Stay

## 2023-07-07 ENCOUNTER — Other Ambulatory Visit: Payer: Self-pay | Admitting: Oncology

## 2023-07-07 ENCOUNTER — Inpatient Hospital Stay: Admitting: Hospice and Palliative Medicine

## 2023-07-07 DIAGNOSIS — C189 Malignant neoplasm of colon, unspecified: Secondary | ICD-10-CM

## 2023-07-08 ENCOUNTER — Emergency Department

## 2023-07-08 ENCOUNTER — Other Ambulatory Visit: Payer: Self-pay

## 2023-07-08 ENCOUNTER — Emergency Department: Admission: EM | Admit: 2023-07-08 | Discharge: 2023-07-09 | Disposition: A | Discharge status: Home / Self Care

## 2023-07-08 ENCOUNTER — Encounter: Payer: Self-pay | Admitting: Oncology

## 2023-07-08 DIAGNOSIS — N189 Chronic kidney disease, unspecified: Secondary | ICD-10-CM | POA: Diagnosis not present

## 2023-07-08 DIAGNOSIS — R42 Dizziness and giddiness: Secondary | ICD-10-CM | POA: Insufficient documentation

## 2023-07-08 DIAGNOSIS — Z85038 Personal history of other malignant neoplasm of large intestine: Secondary | ICD-10-CM | POA: Diagnosis not present

## 2023-07-08 DIAGNOSIS — I129 Hypertensive chronic kidney disease with stage 1 through stage 4 chronic kidney disease, or unspecified chronic kidney disease: Secondary | ICD-10-CM | POA: Diagnosis not present

## 2023-07-08 LAB — URINALYSIS, COMPLETE (UACMP) WITH MICROSCOPIC
Bacteria, UA: NONE SEEN
Bilirubin Urine: NEGATIVE
Glucose, UA: NEGATIVE mg/dL
Hgb urine dipstick: NEGATIVE
Ketones, ur: NEGATIVE mg/dL
Leukocytes,Ua: NEGATIVE
Nitrite: NEGATIVE
Protein, ur: 30 mg/dL — AB
Specific Gravity, Urine: 1.015 (ref 1.005–1.030)
Squamous Epithelial / HPF: 0 /HPF (ref 0–5)
pH: 5 (ref 5.0–8.0)

## 2023-07-08 LAB — TROPONIN I (HIGH SENSITIVITY)
Troponin I (High Sensitivity): 22 ng/L — ABNORMAL HIGH (ref <18)
Troponin I (High Sensitivity): 21 ng/L — ABNORMAL HIGH (ref <18)

## 2023-07-08 LAB — COMPREHENSIVE METABOLIC PANEL WITH GFR
ALT: 7 U/L (ref 0–44)
AST: 52 U/L — ABNORMAL HIGH (ref 15–41)
Albumin: 2.9 g/dL — ABNORMAL LOW (ref 3.5–5.0)
Alkaline Phosphatase: 205 U/L — ABNORMAL HIGH (ref 38–126)
Anion gap: 11 (ref 5–15)
BUN: 12 mg/dL (ref 8–23)
CO2: 22 mmol/L (ref 22–32)
Calcium: 8.4 mg/dL — ABNORMAL LOW (ref 8.9–10.3)
Chloride: 103 mmol/L (ref 98–111)
Creatinine, Ser: 0.76 mg/dL (ref 0.61–1.24)
GFR, Estimated: >60 mL/min (ref >60)
Glucose, Bld: 92 mg/dL (ref 70–99)
Potassium: 3.6 mmol/L (ref 3.5–5.1)
Sodium: 136 mmol/L (ref 135–145)
Total Bilirubin: 0.9 mg/dL (ref 0.0–1.2)
Total Protein: 7.3 g/dL (ref 6.5–8.1)

## 2023-07-08 LAB — CBC
HCT: 29.8 % — ABNORMAL LOW (ref 39.0–52.0)
Hemoglobin: 9 g/dL — ABNORMAL LOW (ref 13.0–17.0)
MCH: 25.7 pg — ABNORMAL LOW (ref 26.0–34.0)
MCHC: 30.2 g/dL (ref 30.0–36.0)
MCV: 85.1 fL (ref 80.0–100.0)
Platelets: 317 10*3/uL (ref 150–400)
RBC: 3.5 MIL/uL — ABNORMAL LOW (ref 4.22–5.81)
RDW: 18.6 % — ABNORMAL HIGH (ref 11.5–15.5)
WBC: 6.5 10*3/uL (ref 4.0–10.5)
nRBC: 0.3 % — ABNORMAL HIGH (ref 0.0–0.2)

## 2023-07-08 LAB — PROTIME-INR
INR: 2.2 — ABNORMAL HIGH (ref 0.8–1.2)
Prothrombin Time: 24.4 s — ABNORMAL HIGH (ref 11.4–15.2)

## 2023-07-08 MED ORDER — MECLIZINE HCL 12.5 MG PO TABS: 12.5000 mg | ORAL_TABLET | Freq: Three times a day (TID) | ORAL | 0 refills | Status: DC | PRN

## 2023-07-08 NOTE — Discharge Instructions (Addendum)
 Your evaluation in the emergency department is overall reassuring.  I am unsure as to the exact cause of your earlier episode of dizziness, but we saw no concerning findings today.  I prescribed you a dizziness medication to use as needed for any recurrent symptoms, and I recommend you follow-up with your primary care doctor for reevaluation.  Return to the emergency department with any new or worsening symptoms.

## 2023-07-08 NOTE — ED Provider Notes (Signed)
 [facility] Provider Note    Event Date/Time   First MD Initiated Contact with Patient 07/08/23 1846     (approximate)   History   Dizziness  First Nurse Note: Patient to ED via [facility] from home for dizziness. PT reports dizziness started when standing. Reports recent dx of colon cancer and blood clot. VS WNL  Pt presents to the ED via [facility] from home for dizziness. Pt A&Ox4. Pt reports an episode of dizziness at 11am that lasted 2-3 minutes this morning. Pt denies any symptoms at this time. Pt is currently receiving chemo for colon cancer. Pt is taking a blood thinner for blood clots. Pt does have a stutter, but states that this is normal for him as he has parkinson's. Pt does have a hx of vertigo.   HPI Trevor Flores is a 63 y.o. 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  Did about 3 minutes, then self resolved.  No clear oppositionality.  No chest pain, shortness of breath.  Did feel lightheaded. - Has had no recurrence of symptoms - No focal weakness - Otherwise in usual state of health, no urinary symptoms  Per chart review, admitted earlier this month bilateral pulmonary emboli.  Underwent thrombectomy, IVC filter placed on 4/30.  Transitioned from heparin  to Eliquis .      Physical Exam   Triage Vital Signs: ED Triage Vitals [07/08/23 1229]  Encounter Vitals Group     BP 131/87     Systolic BP Percentile      Diastolic BP Percentile      Pulse Rate 96     Resp 18     Temp 98.3 F (36.8 C)     Temp Source Oral     SpO2 100 %     Weight 202 lb 13.2 oz (92 kg)     Height 6' (1.829 m)     Head Circumference      Peak Flow      Pain Score 0     Pain Loc      Pain Education      Exclude from Growth Chart      Most recent vital signs: Vitals:   07/08/23 1900 07/08/23 1930  BP: 138/89 135/88  Pulse: 77 73  Resp: 19 19  Temp:    SpO2: 98% 99%     General: Awake, no distress.  CV:  Good peripheral perfusion. RRR, RP 2+ Resp:  Normal effort. CTAB Abd:  No distention. Nontender to deep palpation throughout Neuro:  Aox4, CN II-XII intact, FNF wnl, finger taps fast b/l, 5/5 strength in bilateral finger extension/grip, EHL/FHL. BUE AG 10+ sec no drift, BLE AG 5+ sec no drift. Ambulates with steady gait. SILT.     ED Results / Procedures / Treatments   Labs (all labs ordered are listed, but only abnormal results are displayed) Labs Reviewed  COMPREHENSIVE METABOLIC PANEL WITH GFR - Abnormal; Notable for the following components:      Result Value   Calcium  8.4 (*)    Albumin 2.9 (*)    AST 52 (*)    Alkaline Phosphatase 205 (*)    All other components within normal limits  CBC - Abnormal; Notable for the following components:   RBC 3.50 (*)    Hemoglobin 9.0 (*)  HCT 29.8 (*)    MCH 25.7 (*)    RDW 18.6 (*)    nRBC 0.3 (*)    All other components within normal limits  PROTIME-INR - Abnormal; Notable for the following components:   Prothrombin Time 24.4 (*)    INR 2.2 (*)    All other components within normal limits  URINALYSIS, COMPLETE (UACMP) WITH MICROSCOPIC - Abnormal; Notable for the following components:   Color, Urine YELLOW (*)    APPearance CLEAR (*)    Protein, ur 30 (*)    All other components within normal limits  TROPONIN I (HIGH SENSITIVITY) - Abnormal; Notable for the following components:   Troponin I (High Sensitivity) 22 (*)    All other components within normal limits  TROPONIN I (HIGH SENSITIVITY) - Abnormal; Notable for the following components:   Troponin I (High Sensitivity) 21 (*)    All other components within normal limits     EKG  See ED course   RADIOLOGY Radiology interpreted by myself and radiology reports  reviewed.  PROCEDURES:  Critical Care performed: No  Procedures   MEDICATIONS ORDERED IN ED: Medications - No data to display   IMPRESSION / MDM / ASSESSMENT AND PLAN / ED COURSE  I reviewed the triage vital signs and the nursing notes.                              DDX/MDM/AP: Differential diagnosis includes, but is not limited to, peripheral etiology of transient vertigo (ex BPPV), doubt intracranial pathology given very transient nature and no localizing symptoms though given recent initiation of blood thinner will get screening CT head.  Consider underlying electrolyte abnormality or anemia.  Doubt ACS.  Consider transient arrhythmia.  No history or findings to suggest underlying infection.  Plan: - Labs - EKG - CT head - Reassess  Patient's presentation is most consistent with acute presentation with potential threat to life or bodily function.  The patient is on the cardiac monitor to evaluate for evidence of arrhythmia and/or significant heart rate changes.  ED course below.  Workup unremarkable.  Overall suspect likely BPPV or other peripheral etiology of vertigo.  No concerning findings today, no concern for posterior CVA.  Rx meclizine .  Plan for PMD follow-up.  ED return precautions in place.  Patient agrees with plan.  Clinical Course as of 07/08/23 2353  Tue Jul 08, 2023  1939 Ecg = sinus rhythm, rate 97, no ST elevation or depression, no significant repolarization abnormality, left axis deviation.  Normal intervals.  No evidence of ischemia nor arrhythmia on my read. [MM]  1939 CBC with no leukocytosis, improved anemia [MM]  1940 CMP reviewed, no acute abnormalities [MM]  2037 CT head negative on my interpretation, radiology report reviewed and below IMPRESSION: No acute intracranial abnormality.   [MM]  2351 Repeat troponin stable/downtrending  Urinalysis with no evidence of infection [MM]    Clinical Course User Index [MM] Collis Deaner, MD      FINAL CLINICAL IMPRESSION(S) / ED DIAGNOSES   Final diagnoses:  Dizziness     Rx / DC Orders   ED Discharge Orders          Ordered    meclizine  (ANTIVERT ) 12.5 MG tablet  3 times daily PRN        07/08/23 2352             Note:  This document was prepared using Dragon  voice recognition software and may include unintentional dictation errors.   Collis Deaner, MD 07/08/23 [phone]

## 2023-07-08 NOTE — ED Notes (Signed)
 This RN gave report to Wilton Hasting RN and performed care handoff. Call light in reach, bed wheels locked, side rail raised, pt updated on plan of care. Rounding completed.

## 2023-07-08 NOTE — ED Triage Notes (Addendum)
 Pt presents to the ED via [facility] from home for dizziness. Pt A&Ox4. Pt reports an episode of dizziness at 11am that lasted 2-3 minutes this morning. Pt denies any symptoms at this time. Pt is currently receiving chemo for colon cancer. Pt is taking a blood thinner for blood clots. Pt does have a stutter, but states that this is normal for him as he has parkinson's. Pt does have a hx of vertigo.

## 2023-07-08 NOTE — ED Notes (Signed)
 Blue top sent to lab.

## 2023-07-08 NOTE — ED Triage Notes (Signed)
 First Nurse Note: Patient to ED via [facility] from home for dizziness. PT reports dizziness started when standing. Reports recent dx of colon cancer and blood clot. VS WNL

## 2023-07-09 ENCOUNTER — Encounter: Payer: Self-pay | Admitting: Oncology

## 2023-07-09 ENCOUNTER — Inpatient Hospital Stay

## 2023-07-09 NOTE — Anesthesia Postprocedure Evaluation (Signed)
 Anesthesia Post Note  Patient: Trevor Flores  Procedure(s) Performed: INSERTION, TUNNELED CENTRAL VENOUS DEVICE, WITH PORT (Chest)  Patient location during evaluation: PACU Anesthesia Type: General Level of consciousness: awake and alert Pain management: pain level controlled Vital Signs Assessment: post-procedure vital signs reviewed and stable Respiratory status: spontaneous breathing, nonlabored ventilation, respiratory function stable and patient connected to nasal cannula oxygen Cardiovascular status: blood pressure returned to baseline and stable Postop Assessment: no apparent nausea or vomiting Anesthetic complications: no   No notable events documented.   Last Vitals:  Vitals:   06/26/23 1330 06/26/23 1353  BP: 118/74 124/80  Pulse: 78 80  Resp: 19 18  Temp: 36.9 C 36.8 C  SpO2: 93% 99%    Last Pain:  Vitals:   06/26/23 1353  TempSrc: Temporal                 Zula Hitch

## 2023-07-09 NOTE — Anesthesia Preprocedure Evaluation (Signed)
 Anesthesia Evaluation  Patient identified by MRN, date of birth, ID band Patient awake    Reviewed: Allergy & Precautions, H&P , NPO status , Patient's Chart, lab work & pertinent test results, reviewed documented beta blocker date and time   Airway Mallampati: II   Neck ROM: full    Dental  (+) Poor Dentition   Pulmonary shortness of breath   Pulmonary exam normal        Cardiovascular Exercise Tolerance: Poor hypertension, + angina with exertion + CAD  Normal cardiovascular exam+ Valvular Problems/Murmurs  Rhythm:regular Rate:Normal     Neuro/Psych CVA  negative psych ROS   GI/Hepatic Neg liver ROS,GERD  Medicated,,  Endo/Other  negative endocrine ROS    Renal/GU negative Renal ROS  negative genitourinary   Musculoskeletal   Abdominal   Peds  Hematology  (+) Blood dyscrasia, anemia   Anesthesia Other Findings Past Medical History: 2025: 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 12/2011 Echo: EF 60%, no rwma. No date: Hemiparesis affecting right side as late effect of  cerebrovascular accident (CVA) (HCC) No date: History of stress test     Comment:  a. 04/2004 Ex MV: Ex time 10:30. Hypertensive response.               No ischemia/infarct. No date: Hyperlipidemia No date: Hypertension No date: Long-term use of aspirin  therapy No date: Palpitations No date: Parkinson disease (HCC) No date: Personal history of gout No date:  Possible Lacunar infarction [facility])     Comment:  a. 12/2011 Head CT: CSF density lateral to R cerebral               peduncle - ? prior lacunar infarct. No date: Pre-syncope     Comment:  a. 06/2014 Monitor: Sinus rhythm. No significant               arrhythmia. No date: Resting tremor No date: Stroke [facility]) No date: Vascular parkinsonism (HCC) No date: Vertigo Past Surgical History: No date: APPENDECTOMY 02/04/2023: COLONOSCOPY WITH PROPOFOL ; N/A     Comment:  Procedure: COLONOSCOPY WITH PROPOFOL ;  Surgeon: Toledo,               Alphonsus Jeans, MD;  Location: [facility] ENDOSCOPY;  Service:               Gastroenterology;  Laterality: N/A; 06/10/2023: COLONOSCOPY WITH PROPOFOL ; N/A     Comment:  Procedure: COLONOSCOPY WITH PROPOFOL ;  Surgeon: Toledo,               Alphonsus Jeans, MD;  Location: [facility] ENDOSCOPY;  Service:               Gastroenterology;  Laterality: N/A; 02/04/2023: ESOPHAGOGASTRODUODENOSCOPY (EGD) WITH PROPOFOL ; N/A     Comment:  Procedure: ESOPHAGOGASTRODUODENOSCOPY (EGD) WITH               PROPOFOL ;  Surgeon: Toledo, Alphonsus Jeans, MD;  Location:               [facility] ENDOSCOPY;  Service: Gastroenterology;  Laterality:               N/A; 06/10/2023: ESOPHAGOGASTRODUODENOSCOPY (EGD) WITH PROPOFOL ; N/A     Comment:  Procedure: ESOPHAGOGASTRODUODENOSCOPY (EGD) WITH               PROPOFOL ;  Surgeon: Toledo, Alphonsus Jeans, MD;  Location:               [facility] ENDOSCOPY;  Service: Gastroenterology;  Laterality:               N/A; ?: HEMORRHOID BANDING     Comment:  Dr Bradford Cadet 07/02/2023: IVC FILTER INSERTION; N/A     Comment:  Procedure: IVC FILTER INSERTION;  Surgeon: Celso College,              MD;  Location: [facility] INVASIVE CV LAB;  Service:               Cardiovascular;  Laterality: N/A; 06/10/2023: POLYPECTOMY     Comment:  Procedure: POLYPECTOMY, INTESTINE;  Surgeon: Corky Diener,               Alphonsus Jeans, MD;  Location: [facility] ENDOSCOPY;  Service:               Gastroenterology;; 06/26/2023: PORTACATH  PLACEMENT; N/A     Comment:  Procedure: INSERTION, TUNNELED CENTRAL VENOUS DEVICE,               WITH PORT;  Surgeon: [name], MD;                Location: [facility] ORS;  Service: General;  Laterality: N/A; 07/02/2023: PULMONARY THROMBECTOMY; Bilateral     Comment:  Procedure: PULMONARY THROMBECTOMY;  Surgeon: Celso College, MD;  Location: [facility] INVASIVE CV LAB;  Service:               Cardiovascular;  Laterality: Bilateral;   Reproductive/Obstetrics negative OB ROS                             Anesthesia Physical Anesthesia Plan  ASA: 4  Anesthesia Plan: General   Post-op Pain Management:    Induction:   PONV Risk Score and Plan:   Airway Management Planned:   Additional Equipment:   Intra-op Plan:   Post-operative Plan:   Informed Consent: I have reviewed the patients History and Physical, chart, labs and discussed the procedure including the risks, benefits and alternatives for the proposed anesthesia with the patient or authorized representative who has indicated his/her understanding and acceptance.     Dental Advisory Given  Plan Discussed with: CRNA  Anesthesia Plan Comments:        Anesthesia Quick Evaluation

## 2023-07-14 ENCOUNTER — Inpatient Hospital Stay

## 2023-07-14 ENCOUNTER — Inpatient Hospital Stay: Attending: Oncology

## 2023-07-14 ENCOUNTER — Inpatient Hospital Stay (HOSPITAL_BASED_OUTPATIENT_CLINIC_OR_DEPARTMENT_OTHER): Admitting: Oncology

## 2023-07-14 ENCOUNTER — Encounter: Payer: Self-pay | Admitting: Oncology

## 2023-07-14 VITALS — HR 90

## 2023-07-14 VITALS — BP 107/82 | HR 104 | Temp 97.5°F | Resp 16 | Wt 198.1 lb

## 2023-07-14 DIAGNOSIS — I130 Hypertensive heart and chronic kidney disease with heart failure and stage 1 through stage 4 chronic kidney disease, or unspecified chronic kidney disease: Secondary | ICD-10-CM | POA: Diagnosis not present

## 2023-07-14 DIAGNOSIS — N189 Chronic kidney disease, unspecified: Secondary | ICD-10-CM

## 2023-07-14 DIAGNOSIS — C189 Malignant neoplasm of colon, unspecified: Secondary | ICD-10-CM

## 2023-07-14 DIAGNOSIS — Z5111 Encounter for antineoplastic chemotherapy: Secondary | ICD-10-CM | POA: Diagnosis present

## 2023-07-14 DIAGNOSIS — K769 Liver disease, unspecified: Secondary | ICD-10-CM | POA: Insufficient documentation

## 2023-07-14 DIAGNOSIS — C19 Malignant neoplasm of rectosigmoid junction: Secondary | ICD-10-CM | POA: Insufficient documentation

## 2023-07-14 DIAGNOSIS — I2699 Other pulmonary embolism without acute cor pulmonale: Secondary | ICD-10-CM

## 2023-07-14 DIAGNOSIS — K5900 Constipation, unspecified: Secondary | ICD-10-CM

## 2023-07-14 DIAGNOSIS — D649 Anemia, unspecified: Secondary | ICD-10-CM | POA: Insufficient documentation

## 2023-07-14 DIAGNOSIS — Z7901 Long term (current) use of anticoagulants: Secondary | ICD-10-CM | POA: Insufficient documentation

## 2023-07-14 DIAGNOSIS — C787 Secondary malignant neoplasm of liver and intrahepatic bile duct: Secondary | ICD-10-CM | POA: Diagnosis not present

## 2023-07-14 DIAGNOSIS — R634 Abnormal weight loss: Secondary | ICD-10-CM | POA: Diagnosis not present

## 2023-07-14 DIAGNOSIS — Z86711 Personal history of pulmonary embolism: Secondary | ICD-10-CM | POA: Diagnosis not present

## 2023-07-14 DIAGNOSIS — D500 Iron deficiency anemia secondary to blood loss (chronic): Secondary | ICD-10-CM

## 2023-07-14 DIAGNOSIS — Z79899 Other long term (current) drug therapy: Secondary | ICD-10-CM | POA: Diagnosis not present

## 2023-07-14 DIAGNOSIS — Z86718 Personal history of other venous thrombosis and embolism: Secondary | ICD-10-CM | POA: Diagnosis not present

## 2023-07-14 DIAGNOSIS — G893 Neoplasm related pain (acute) (chronic): Secondary | ICD-10-CM | POA: Insufficient documentation

## 2023-07-14 LAB — CBC WITH DIFFERENTIAL (CANCER CENTER ONLY)
Abs Immature Granulocytes: 0.08 10*3/uL — ABNORMAL HIGH (ref 0.00–0.07)
Basophils Absolute: 0.1 10*3/uL (ref 0.0–0.1)
Basophils Relative: 1 %
Eosinophils Absolute: 0 10*3/uL (ref 0.0–0.5)
Eosinophils Relative: 0 %
HCT: 31.1 % — ABNORMAL LOW (ref 39.0–52.0)
Hemoglobin: 9.5 g/dL — ABNORMAL LOW (ref 13.0–17.0)
Immature Granulocytes: 1 %
Lymphocytes Relative: 18 %
Lymphs Abs: 1.3 10*3/uL (ref 0.7–4.0)
MCH: 26.2 pg (ref 26.0–34.0)
MCHC: 30.5 g/dL (ref 30.0–36.0)
MCV: 85.9 fL (ref 80.0–100.0)
Monocytes Absolute: 0.6 10*3/uL (ref 0.1–1.0)
Monocytes Relative: 9 %
Neutro Abs: 5 10*3/uL (ref 1.7–7.7)
Neutrophils Relative %: 71 %
Platelet Count: 307 10*3/uL (ref 150–400)
RBC: 3.62 MIL/uL — ABNORMAL LOW (ref 4.22–5.81)
RDW: 21.1 % — ABNORMAL HIGH (ref 11.5–15.5)
WBC Count: 7.1 10*3/uL (ref 4.0–10.5)
nRBC: 0 % (ref 0.0–0.2)

## 2023-07-14 LAB — CMP (CANCER CENTER ONLY)
ALT: 8 U/L (ref 0–44)
AST: 75 U/L — ABNORMAL HIGH (ref 15–41)
Albumin: 3.2 g/dL — ABNORMAL LOW (ref 3.5–5.0)
Alkaline Phosphatase: 274 U/L — ABNORMAL HIGH (ref 38–126)
Anion gap: 14 (ref 5–15)
BUN: 17 mg/dL (ref 8–23)
CO2: 22 mmol/L (ref 22–32)
Calcium: 8.6 mg/dL — ABNORMAL LOW (ref 8.9–10.3)
Chloride: 101 mmol/L (ref 98–111)
Creatinine: 1.02 mg/dL (ref 0.61–1.24)
GFR, Estimated: >60 mL/min (ref >60)
Glucose, Bld: 85 mg/dL (ref 70–99)
Potassium: 3.6 mmol/L (ref 3.5–5.1)
Sodium: 137 mmol/L (ref 135–145)
Total Bilirubin: 1.1 mg/dL (ref 0.0–1.2)
Total Protein: 7.8 g/dL (ref 6.5–8.1)

## 2023-07-14 LAB — TOTAL PROTEIN, URINE DIPSTICK: Protein, ur: 30 mg/dL — AB

## 2023-07-14 MED ORDER — PALONOSETRON HCL INJECTION 0.25 MG/5ML
0.2500 mg | Freq: Once | INTRAVENOUS | Status: AC
  Administered 2023-07-14: 0.25 mg via INTRAVENOUS
  Filled 2023-07-14: qty 5

## 2023-07-14 MED ORDER — PROCHLORPERAZINE MALEATE 10 MG PO TABS: 10.0000 mg | ORAL_TABLET | Freq: Four times a day (QID) | ORAL | Status: DC | PRN

## 2023-07-14 MED ORDER — SODIUM CHLORIDE 0.9 % IV SOLN
2400.0000 mg/m2 | INTRAVENOUS | Status: DC
  Administered 2023-07-14: 5000 mg via INTRAVENOUS
  Filled 2023-07-14: qty 100

## 2023-07-14 MED ORDER — SODIUM CHLORIDE 0.9 % IV SOLN
5.0000 mg/kg | Freq: Once | INTRAVENOUS | Status: DC
  Filled 2023-07-14: qty 20

## 2023-07-14 MED ORDER — IRON SUCROSE 20 MG/ML IV SOLN
200.0000 mg | Freq: Once | INTRAVENOUS | Status: AC
  Administered 2023-07-14: 200 mg via INTRAVENOUS

## 2023-07-14 MED ORDER — LEUCOVORIN CALCIUM INJECTION 350 MG
400.0000 mg/m2 | Freq: Once | INTRAVENOUS | Status: AC
  Administered 2023-07-14: 856 mg via INTRAVENOUS
  Filled 2023-07-14: qty 42.8

## 2023-07-14 MED ORDER — DEXAMETHASONE SODIUM PHOSPHATE 10 MG/ML IJ SOLN
10.0000 mg | Freq: Once | INTRAMUSCULAR | Status: AC
  Administered 2023-07-14: 10 mg via INTRAVENOUS
  Filled 2023-07-14: qty 1

## 2023-07-14 MED ORDER — OXALIPLATIN CHEMO INJECTION 100 MG/20ML
75.0000 mg/m2 | Freq: Once | INTRAVENOUS | Status: AC
  Administered 2023-07-14: 150 mg via INTRAVENOUS
  Filled 2023-07-14: qty 25.92

## 2023-07-14 MED ORDER — SODIUM CHLORIDE 0.9 % IV SOLN
INTRAVENOUS | Status: DC
  Filled 2023-07-14: qty 250

## 2023-07-14 MED ORDER — DEXTROSE 5 % IV SOLN
INTRAVENOUS | Status: DC
  Filled 2023-07-14: qty 250

## 2023-07-14 MED ORDER — SODIUM CHLORIDE 0.9 % IV SOLN
450.0000 mg | Freq: Once | INTRAVENOUS | Status: AC
  Administered 2023-07-14: 450 mg via INTRAVENOUS
  Filled 2023-07-14: qty 14

## 2023-07-14 NOTE — Assessment & Plan Note (Signed)
 refer patient to nutritionist

## 2023-07-14 NOTE — Progress Notes (Signed)
 Per Dr. Wilhelmenia Harada,  OK to treat with HR 104   Glendora Landsman, PharmD, BCPS Clinical Pharmacist

## 2023-07-14 NOTE — Patient Instructions (Signed)
 [facility] - A DEPT OF [facility]  Discharge Instructions: Thank you for choosing [facility] to provide your oncology and hematology care.  If you have a lab appointment with the Cancer Center, please go directly to the Cancer Center and check in at the registration area.  Wear comfortable clothing and clothing appropriate for easy access to any Portacath or PICC line.   We strive to give you quality time with your provider. You may need to reschedule your appointment if you arrive late (15 or more minutes).  Arriving late affects you and other patients whose appointments are after yours.  Also, if you miss three or more appointments without notifying the office, you may be dismissed from the clinic at the provider's discretion.      For prescription refill requests, have your pharmacy contact our office and allow 72 hours for refills to be completed.    Today you received the following chemotherapy and/or immunotherapy agents Oxaliplatin, Leucovorin , Adrucil and Mvasi      To help prevent nausea and vomiting after your treatment, we encourage you to take your nausea medication as directed.  BELOW ARE SYMPTOMS THAT SHOULD BE REPORTED IMMEDIATELY: *FEVER GREATER THAN 100.4 F (38 C) OR HIGHER *CHILLS OR SWEATING *NAUSEA AND VOMITING THAT IS NOT CONTROLLED WITH YOUR NAUSEA MEDICATION *UNUSUAL SHORTNESS OF BREATH *UNUSUAL BRUISING OR BLEEDING *URINARY PROBLEMS (pain or burning when urinating, or frequent urination) *BOWEL PROBLEMS (unusual diarrhea, constipation, pain near the anus) TENDERNESS IN MOUTH AND THROAT WITH OR WITHOUT PRESENCE OF ULCERS (sore throat, sores in mouth, or a toothache) UNUSUAL RASH, SWELLING OR PAIN  UNUSUAL VAGINAL DISCHARGE OR ITCHING   Items with * indicate a potential emergency and should be followed up as soon as possible or go to the Emergency Department if any problems should occur.  Please show the  CHEMOTHERAPY ALERT CARD or IMMUNOTHERAPY ALERT CARD at check-in to the Emergency Department and triage nurse.  Should you have questions after your visit or need to cancel or reschedule your appointment, please contact [facility] - A DEPT OF [facility]  [phone] and follow the prompts.  Office hours are 8:00 a.m. to 4:30 p.m. Monday - Friday. Please note that voicemails left after 4:00 p.m. may not be returned until the following business day.  We are closed weekends and major holidays. You have access to a nurse at all times for urgent questions. Please call the main number to the clinic [phone] and follow the prompts.  For any non-urgent questions, you may also contact your provider using MyChart. We now offer e-Visits for anyone 53 and older to request care online for non-urgent symptoms. For details visit [email or web address].   Also download the MyChart app! Go to the app store, search "MyChart", open the app, select [place], and log in with your MyChart username and password.

## 2023-07-14 NOTE — Progress Notes (Signed)
 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 Endoscopy findings, biopsy results, CT imaging findings were reviewed and discussed with patient and his wife. Unfortunately, colon cancer appears to have metastasis to liver.  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  He agrees with the plan. Proceed with cycle 1 - dose reduced FOLFOX + Bevacizumab     CKD (chronic kidney disease) Encourage oral hydration and avoid nephrotoxins.    Normocytic anemia Labs are reviewed and discussed with patient. Lab Results  Component Value Date   HGB 9.5 (L) 07/14/2023   TIBC 335 06/17/2023   IRONPCTSAT 9 (L) 06/17/2023   FERRITIN 93 06/17/2023    He tolerated with Venofer  treatments.  Will proceed with 1 dose of Venofer .   Weight loss refer patient to nutritionist  Constipation Due to partially obstructed colon mass. Recommend patient to use Senokot-S 2 tablet daily and MiraLAX  daily as needed. He knows to stop bowel regimen if he experiences diarrhea  Encounter for antineoplastic chemotherapy Treatment plan as listed above  Liver lesion Not able to be biopsied due to recent VTE  Bilateral pulmonary embolism (HCC) Bilateral PE and bilateral DVT Continue Eliquis  5mg  BID   Orders Placed This Encounter  Procedures   CMP (Cancer Center only)    Standing Status:   Future    Expected Date:   07/21/2023    Expiration Date:    07/13/2024   CBC with Differential (Cancer Center Only)    Standing Status:   Future    Expected Date:   07/21/2023    Expiration Date:   07/13/2024   CBC with Differential (Cancer Center Only)    Standing Status:   Future    Expected Date:   07/29/2023    Expiration Date:   07/28/2024   CMP (Cancer Center only)    Standing Status:   Future    Expected Date:   07/29/2023    Expiration Date:   07/28/2024   Follow-up 1 week All questions were answered. The patient knows to call the clinic with any problems, questions or concerns.  [name], MD, PhD [facility] Hematology Oncology 07/14/2023    HISTORY OF PRESENTING ILLNESS:  Trevor Flores 63 y.o. male presents to establish care for colon cancer I have reviewed his chart and materials related to his cancer extensively and collaborated history with the patient. Summary of oncologic history is as follows: Oncology History  Adenocarcinoma of colon (HCC)  06/12/2023 Imaging   CT chest abdomen pelvis with contrast showed  1. Large, circumferential mass of the very redundant mid sigmoid colon, measuring 6.5 cm in length and 4.2 x 4.0 cm in diameter, consistent with primary colonic malignancy. 2. Enlarged lymph nodes or metastatic soft tissue mass in the sigmoid mesocolon as well as an enlarged left iliac lymph node, consistent with nodal metastatic disease. 3. Numerous bulky hypodense liver lesions of varying sizes seen throughout the liver, consistent with hepatic metastatic disease. 4. No evidence  of lymphadenopathy or metastatic disease in the chest. 5. Coronary artery disease.     06/17/2023 Initial Diagnosis   Adenocarcinoma of colon (HCC)  He has been experiencing increased constipation, requiring the use of Miralax  and Colace every two days to manage bowel movements. Despite these measures, constipation persists.  He has also experienced unintentional weight loss 06/10/2023 patient's status post upper endoscopy and  colonoscopy. Colonoscopy showed ascending colon 15 mm polyp which was resected and retrieved. A fungating infiltrative ulcerated partially obstructing large mass found in the mid descending colon.  The mass was circumferential.  5 cm in length.  Mass was biopsied.  Pathology showed 1. Ascending  Colon Polyp, hot snare :       - TUBULAR ADENOMA.       - NEGATIVE FOR HIGH-GRADE DYSPLASIA AND MALIGNANCY.        2. Sigmoid Colon Biopsy, mass, mid, cbx :       - INVASIVE MODERATELY DIFFERENTIATED COLORECTAL ADENOCARCINOMA WITH MUCINOUS       FEATURES.        06/17/2023 Cancer Staging   Staging form: Colon and Rectum, AJCC 8th Edition - Clinical stage from 06/17/2023: Stage Unknown (cTX, cN1, cM1) - Signed by [name], MD on 06/17/2023 Stage prefix: Initial diagnosis   07/14/2023 -  Chemotherapy   Patient is on Treatment Plan : COLORECTAL FOLFOX + Bevacizumab q14d      Patient has history of Parkinson's disease, and follows up with neurology Dr. Mason Sole.  Patient is on carbidopa /levodopa  Recent hospitalization due to bilateral PE and lower extremities DVT.  Accompanied by son.  + poor appetite. Constipation is managed well with current bowel regimen.  He takes Oxycodon 2.5mg  twice daily.     MEDICAL HISTORY:  Past Medical History:  Diagnosis Date   Adenocarcinoma of colon metastatic to liver (HCC) 2025   a.) stage IV   Anginal pain (HCC)    Aortic atherosclerosis (HCC)    Chronic kidney disease    Coronary artery disease    DDD (degenerative disc disease), cervical    Diastolic dysfunction    Dyspnea    ED (erectile dysfunction)    a.) on PDE5i (sildenafil) PRN   Generalized osteoarthritis of multiple sites    GERD (gastroesophageal reflux disease)    Heart murmur    a. 12/2011 Echo: EF 60%, no rwma.   Hemiparesis affecting right side as late effect of cerebrovascular accident (CVA) (HCC)    History of stress test    a. 04/2004 Ex MV: Ex time 10:30. Hypertensive response. No  ischemia/infarct.   Hyperlipidemia    Hypertension    Long-term use of aspirin  therapy    Palpitations    Parkinson disease (HCC)    Personal history of gout    Possible Lacunar infarction (HCC)    a. 12/2011 Head CT: CSF density lateral to R cerebral peduncle - ? prior lacunar infarct.   Pre-syncope    a. 06/2014 Monitor: Sinus rhythm. No significant arrhythmia.   Resting tremor    Stroke [facility])    Vascular parkinsonism (HCC)    Vertigo     SURGICAL HISTORY: Past Surgical History:  Procedure Laterality Date   APPENDECTOMY     COLONOSCOPY WITH PROPOFOL  N/A 02/04/2023   Procedure: COLONOSCOPY WITH PROPOFOL ;  Surgeon: Toledo, Alphonsus Jeans, MD;  Location: [facility] ENDOSCOPY;  Service: Gastroenterology;  Laterality: N/A;   COLONOSCOPY WITH PROPOFOL  N/A 06/10/2023   Procedure: COLONOSCOPY WITH PROPOFOL ;  Surgeon: Corky Diener, Teodoro K, MD;  Location:  [facility] ENDOSCOPY;  Service: Gastroenterology;  Laterality: N/A;   ESOPHAGOGASTRODUODENOSCOPY (EGD) WITH PROPOFOL  N/A 02/04/2023   Procedure: ESOPHAGOGASTRODUODENOSCOPY (EGD) WITH PROPOFOL ;  Surgeon: Toledo, Alphonsus Jeans, MD;  Location: [facility] ENDOSCOPY;  Service: Gastroenterology;  Laterality: N/A;   ESOPHAGOGASTRODUODENOSCOPY (EGD) WITH PROPOFOL  N/A 06/10/2023   Procedure: ESOPHAGOGASTRODUODENOSCOPY (EGD) WITH PROPOFOL ;  Surgeon: Toledo, Alphonsus Jeans, MD;  Location: [facility] ENDOSCOPY;  Service: Gastroenterology;  Laterality: N/A;   HEMORRHOID BANDING  ?   Dr Bradford Cadet   IVC FILTER INSERTION N/A 07/02/2023   Procedure: IVC FILTER INSERTION;  Surgeon: Celso College, MD;  Location: [facility] INVASIVE CV LAB;  Service: Cardiovascular;  Laterality: N/A;   POLYPECTOMY  06/10/2023   Procedure: POLYPECTOMY, INTESTINE;  Surgeon: Toledo, Alphonsus Jeans, MD;  Location: [facility] ENDOSCOPY;  Service: Gastroenterology;;   PORTACATH PLACEMENT N/A 06/26/2023   Procedure: INSERTION, TUNNELED CENTRAL VENOUS DEVICE, WITH PORT;  Surgeon: [name], MD;  Location: [facility] ORS;  Service: General;   Laterality: N/A;   PULMONARY THROMBECTOMY Bilateral 07/02/2023   Procedure: PULMONARY THROMBECTOMY;  Surgeon: Celso College, MD;  Location: [facility] INVASIVE CV LAB;  Service: Cardiovascular;  Laterality: Bilateral;    SOCIAL HISTORY: Social History   Socioeconomic History   Marital status: Married    Spouse name: Garson,Thelma (Spouse)   Number of children: Not on file   Years of education: Not on file   Highest education level: Not on file  Occupational History   Not on file  Tobacco Use   Smoking status: Never   Smokeless tobacco: Former    Types: Designer, multimedia Use   Vaping status: Never Used  Substance and Sexual Activity   Alcohol use: No   Drug use: Never   Sexual activity: Yes    Birth control/protection: None  Other Topics Concern   Not on file  Social History Narrative   Not on file   Social Drivers of Health   Financial Resource Strain: Low Risk  (07/09/2023)   Received from [facility]   Overall Financial Resource Strain (CARDIA)    Difficulty of Paying Living Expenses: Not hard at all  Food Insecurity: No Food Insecurity (07/09/2023)   Received from [facility]   Hunger Vital Sign    Worried About Running Out of Food in the Last Year: Never true    Ran Out of Food in the Last Year: Never true  Transportation Needs: No Transportation Needs (07/09/2023)   Received from [facility] - Transportation    In the past 12 months, has lack of transportation kept you from medical appointments or from getting medications?: No    Lack of Transportation (Non-Medical): No  Physical Activity: Not on file  Stress: Not on file  Social Connections: Unknown (08/09/2022)   [facility]   Social Network    Social Network: Not on file  Intimate Partner Violence: Not At Risk (06/30/2023)   Humiliation, Afraid, Rape, and Kick questionnaire    Fear of Current or Ex-Partner: No    Emotionally Abused: No     Physically Abused: No    Sexually Abused: No    FAMILY HISTORY: Family History  Problem Relation Age of Onset   Diabetes Brother    Colon cancer Neg Hx     ALLERGIES:  is allergic to other and hydralazine .  MEDICATIONS:  Current Outpatient Medications  Medication Sig Dispense Refill   albuterol  (VENTOLIN  HFA) 108 (90 Base) MCG/ACT inhaler Inhale 1-2 puffs into  the lungs every 4 (four) hours as needed.     allopurinol (ZYLOPRIM) 100 MG tablet Take 100 mg by mouth daily.     apixaban  (ELIQUIS ) 5 MG TABS tablet Take 2 tablets (10 mg total) by mouth 2 (two) times daily for 6 days, THEN 1 tablet (5 mg total) 2 (two) times daily. 100 tablet 0   carbidopa -levodopa  (SINEMET  IR) 25-100 MG tablet Take 1 tablet by mouth 3 (three) times daily.     diltiazem  (TIAZAC ) 360 MG 24 hr capsule Take 1 capsule (360 mg total) by mouth daily. 90 capsule 3   Docusate Sodium  (DSS) 100 MG CAPS Take 1 capsule by mouth 2 (two) times daily.     doxazosin  (CARDURA ) 1 MG tablet TAKE 1 TABLET BY MOUTH DAILY AS NEEDED FOR PRESSURE GREATER THAN 150 90 tablet 0   fluticasone (FLONASE) 50 MCG/ACT nasal spray Place into both nostrils daily as needed for allergies or rhinitis.     loratadine (CLARITIN) 10 MG tablet Take 10 mg by mouth daily as needed for allergies.     losartan  (COZAAR ) 100 MG tablet TAKE 1 TABLET(100 MG) BY MOUTH DAILY IN THE MORNING 90 tablet 2   meclizine  (ANTIVERT ) 12.5 MG tablet Take 1 tablet (12.5 mg total) by mouth 3 (three) times daily as needed for dizziness. 30 tablet 0   metoprolol  succinate (TOPROL -XL) 25 MG 24 hr tablet TAKE 1 TABLET(25 MG) BY MOUTH EVERY EVENING WITH FOOD (Patient taking differently: Take 12.5 mg by mouth daily.) 90 tablet 2   oxyCODONE  (OXY IR/ROXICODONE ) 5 MG immediate release tablet Take 1 tablet (5 mg total) by mouth every 6 (six) hours as needed for severe pain (pain score 7-10) or moderate pain (pain score 4-6). (Patient taking differently: Take 2.5 mg by mouth every 6  (six) hours as needed for severe pain (pain score 7-10) or moderate pain (pain score 4-6).) 60 tablet 0   pantoprazole  (PROTONIX ) 40 MG tablet Take 1 tablet (40 mg total) by mouth daily. 30 tablet 0   polyethylene glycol powder (GLYCOLAX /MIRALAX ) 17 GM/SCOOP powder Take 17 g by mouth daily as needed.     prochlorperazine  (COMPAZINE ) 10 MG tablet Take 1 tablet (10 mg total) by mouth every 6 (six) hours as needed for nausea or vomiting.     rosuvastatin  (CRESTOR ) 5 MG tablet Take 5 mg by mouth daily.     senna-docusate (SENOKOT-S) 8.6-50 MG tablet Take 2 tablets by mouth daily. 60 tablet 1   sildenafil (VIAGRA) 25 MG tablet Take 25 mg by mouth as needed.     SPIRIVA HANDIHALER 18 MCG inhalation capsule Place 18 mcg into inhaler and inhale daily.     Vitamin D, Ergocalciferol, (DRISDOL) 1.25 MG (50000 UNIT) CAPS capsule Take 50,000 Units by mouth once a week.     ondansetron  (ZOFRAN ) 8 MG tablet Take 1 tablet (8 mg total) by mouth every 8 (eight) hours as needed for nausea or vomiting. Start on the third day after chemotherapy. (Patient not taking: Reported on 06/30/2023) 30 tablet 1   No current facility-administered medications for this visit.   Facility-Administered Medications Ordered in Other Visits  Medication Dose Route Frequency Provider Last Rate Last Admin   0.9 %  sodium chloride  infusion   Intravenous Continuous [name], MD 10 mL/hr at 07/14/23 1056 New Bag at 07/14/23 1056   dextrose  5 % solution   Intravenous Continuous [name], MD 10 mL/hr at 07/14/23 1045 New Bag at 07/14/23 1045   fluorouracil (ADRUCIL) 5,000  mg in sodium chloride  0.9 % 150 mL chemo infusion  2,400 mg/m2 (Order-Specific) Intravenous 1 day or 1 dose [name], MD       leucovorin 856 mg in dextrose  5 % 250 mL infusion  400 mg/m2 (Order-Specific) Intravenous Once [name], MD 146 mL/hr at 07/14/23 1205 856 mg at 07/14/23 1205   oxaliplatin (ELOXATIN) 150 mg in dextrose  5 % 500 mL chemo infusion  75 mg/m2  (Order-Specific) Intravenous Once Leotha Voeltz, MD 265 mL/hr at 07/14/23 1207 150 mg at 07/14/23 1207    Review of Systems  Constitutional:  Positive for appetite change, fatigue and unexpected weight change. Negative for chills and fever.  HENT:   Negative for hearing loss and voice change.   Eyes:  Negative for eye problems and icterus.  Respiratory:  Negative for chest tightness, cough and shortness of breath.   Cardiovascular:  Negative for chest pain and leg swelling.  Gastrointestinal:  Positive for blood in stool. Negative for abdominal distention and abdominal pain.  Endocrine: Negative for hot flashes.  Genitourinary:  Negative for difficulty urinating, dysuria and frequency.   Musculoskeletal:  Negative for arthralgias.  Skin:  Negative for itching and rash.  Neurological:  Negative for light-headedness and numbness.  Hematological:  Negative for adenopathy. Does not bruise/bleed easily.  Psychiatric/Behavioral:  Negative for confusion.      PHYSICAL EXAMINATION: ECOG PERFORMANCE STATUS: 1 - Symptomatic but completely ambulatory  Vitals:   07/14/23 0952  BP: 107/82  Pulse: (!) 104  Resp: 16  Temp: (!) 97.5 F (36.4 C)  SpO2: 94%   Filed Weights   07/14/23 0952  Weight: 198 lb 1.6 oz (89.9 kg)    Physical Exam Constitutional:      General: He is not in acute distress.    Appearance: He is not diaphoretic.  HENT:     Head: Normocephalic and atraumatic.  Eyes:     General: No scleral icterus. Cardiovascular:     Rate and Rhythm: Normal rate and regular rhythm.  Pulmonary:     Effort: Pulmonary effort is normal. No respiratory distress.     Breath sounds: Normal breath sounds.  Abdominal:     General: Bowel sounds are normal. There is no distension.     Palpations: Abdomen is soft.     Tenderness: There is no abdominal tenderness.  Musculoskeletal:        General: Normal range of motion.     Cervical back: Normal range of motion and neck supple.  Skin:     General: Skin is warm and dry.     Findings: No erythema.  Neurological:     Mental Status: He is alert and oriented to person, place, and time. Mental status is at baseline.     Motor: No abnormal muscle tone.     Comments: Patient has resting tremor bilaterally  Psychiatric:        Mood and Affect: Affect normal.      LABORATORY DATA:  I have reviewed the data as listed    Latest Ref Rng & Units 07/14/2023    9:27 AM 07/08/2023   12:33 PM 07/04/2023    7:11 AM  CBC  WBC 4.0 - 10.5 K/uL 7.1  6.5  8.3   Hemoglobin 13.0 - 17.0 g/dL 9.5  9.0  8.2   Hematocrit 39.0 - 52.0 % 31.1  29.8  26.2   Platelets 150 - 400 K/uL 307  317  306       Latest  Ref Rng & Units 07/14/2023    9:27 AM 07/08/2023   12:33 PM 07/03/2023    3:57 AM  CMP  Glucose 70 - 99 mg/dL 85  92  74   BUN 8 - 23 mg/dL 17  12  17    Creatinine 0.61 - 1.24 mg/dL 6.96  2.95  2.84   Sodium 135 - 145 mmol/L 137  136  140   Potassium 3.5 - 5.1 mmol/L 3.6  3.6  3.5   Chloride 98 - 111 mmol/L 101  103  110   CO2 22 - 32 mmol/L 22  22  20    Calcium  8.9 - 10.3 mg/dL 8.6  8.4  8.3   Total Protein 6.5 - 8.1 g/dL 7.8  7.3    Total Bilirubin 0.0 - 1.2 mg/dL 1.1  0.9    Alkaline Phos 38 - 126 U/L 274  205    AST 15 - 41 U/L 75  52    ALT 0 - 44 U/L 8  7       RADIOGRAPHIC STUDIES: I have personally reviewed the radiological images as listed and agreed with the findings in the report. CT HEAD WO CONTRAST ( ) Result Date: 07/08/2023 CLINICAL DATA:  Dizziness EXAM: CT HEAD WITHOUT CONTRAST TECHNIQUE: Contiguous axial images were obtained from the base of the skull through the vertex without intravenous contrast. RADIATION DOSE REDUCTION: This exam was performed according to the departmental dose-optimization program which includes automated exposure control, adjustment of the mA and/or kV according to patient size and/or use of iterative reconstruction technique. COMPARISON:  12/05/2011 FINDINGS: Brain: No acute intracranial  abnormality. Specifically, no hemorrhage, hydrocephalus, mass lesion, acute infarction, or significant intracranial injury. Vascular: No hyperdense vessel or unexpected calcification. Skull: No acute calvarial abnormality. Sinuses/Orbits: No acute findings Other: None IMPRESSION: No acute intracranial abnormality. Electronically Signed   By: Janeece Mechanic M.D.   On: 07/08/2023 20:24   PERIPHERAL VASCULAR CATHETERIZATION Result Date: 07/02/2023 See surgical note for result.  ECHOCARDIOGRAM COMPLETE Result Date: 06/30/2023    ECHOCARDIOGRAM REPORT   Patient Name:   PEDROHENRIQUE METAYER Date of Exam: 06/30/2023 Medical Rec #:  [id number]       Height:       72.0 in Accession #:    [id number]      Weight:       205.0 lb Date of Birth:  [birth year]       BSA:          2.153 m Patient Age:    63 years        BP:           112/80 mmHg Patient Gender: M               HR:           59 bpm. Exam Location:  [facility] Procedure: 2D Echo, Cardiac Doppler and Color Doppler (Both Spectral and Color            Flow Doppler were utilized during procedure). Indications:     Congestive heart failure I50.9  History:         Patient has prior history of Echocardiogram examinations, most                  recent 04/04/2023. CHF, CAD, Arrythmias:LBBB; Risk                  Factors:Hypertension.  Sonographer:     Broadus Canes Referring Phys:  [id number] Herbie Loll  DUNCAN Diagnosing Phys: Antionette Kirks MD  Sonographer Comments: Suboptimal apical window. Image acquisition challenging due to respiratory motion. IMPRESSIONS  1. Left ventricular ejection fraction, by estimation, is 65 to 70%. The left ventricle has normal function. The left ventricle has no regional wall motion abnormalities. There is mild left ventricular hypertrophy. Left ventricular diastolic parameters are consistent with Grade I diastolic dysfunction (impaired relaxation).  2. Right ventricular systolic function is normal. The right ventricular size is normal. Tricuspid regurgitation  signal is inadequate for assessing PA pressure.  3. The mitral valve is normal in structure. No evidence of mitral valve regurgitation. No evidence of mitral stenosis.  4. The aortic valve is normal in structure. Aortic valve regurgitation is not visualized. Aortic valve sclerosis is present, with no evidence of aortic valve stenosis. FINDINGS  Left Ventricle: Left ventricular ejection fraction, by estimation, is 65 to 70%. The left ventricle has normal function. The left ventricle has no regional wall motion abnormalities. The left ventricular internal cavity size was normal in size. There is  mild left ventricular hypertrophy. Left ventricular diastolic parameters are consistent with Grade I diastolic dysfunction (impaired relaxation). Right Ventricle: The right ventricular size is normal. No increase in right ventricular wall thickness. Right ventricular systolic function is normal. Tricuspid regurgitation signal is inadequate for assessing PA pressure. Left Atrium: Left atrial size was normal in size. Right Atrium: Right atrial size was normal in size. Pericardium: There is no evidence of pericardial effusion. Mitral Valve: The mitral valve is normal in structure. No evidence of mitral valve regurgitation. No evidence of mitral valve stenosis. Tricuspid Valve: The tricuspid valve is normal in structure. Tricuspid valve regurgitation is not demonstrated. No evidence of tricuspid stenosis. Aortic Valve: The aortic valve is normal in structure. Aortic valve regurgitation is not visualized. Aortic valve sclerosis is present, with no evidence of aortic valve stenosis. Aortic valve mean gradient measures 2.5 mmHg. Aortic valve peak gradient measures 4.5 mmHg. Aortic valve area, by VTI measures 3.16 cm. Pulmonic Valve: The pulmonic valve was normal in structure. Pulmonic valve regurgitation is trivial. No evidence of pulmonic stenosis. Aorta: The aortic root is normal in size and structure. Venous: The inferior vena  cava was not well visualized. IAS/Shunts: No atrial level shunt detected by color flow Doppler.  LEFT VENTRICLE PLAX 2D LVIDd:         4.60 cm   Diastology LVIDs:         2.80 cm   LV e' medial:    6.96 cm/s LV PW:         1.00 cm   LV E/e' medial:  8.2 LV IVS:        1.20 cm   LV e' lateral:   9.57 cm/s LVOT diam:     2.10 cm   LV E/e' lateral: 6.0 LV SV:         62 LV SV Index:   29 LVOT Area:     3.46 cm  RIGHT VENTRICLE RV Basal diam:  2.80 cm RV Mid diam:    2.50 cm LEFT ATRIUM           Index        RIGHT ATRIUM          Index LA diam:      4.90 cm 2.28 cm/m   RA Area:     8.66 cm LA Vol (A4C): 41.9 ml 19.46 ml/m  RA Volume:   16.40 ml 7.62 ml/m  AORTIC VALVE AV Area (  Vmax):    3.06 cm AV Area (Vmean):   2.92 cm AV Area (VTI):     3.16 cm AV Vmax:           106.50 cm/s AV Vmean:          71.850 cm/s AV VTI:            0.197 m AV Peak Grad:      4.5 mmHg AV Mean Grad:      2.5 mmHg LVOT Vmax:         94.20 cm/s LVOT Vmean:        60.500 cm/s LVOT VTI:          0.180 m LVOT/AV VTI ratio: 0.91  AORTA Ao Root diam: 3.50 cm MITRAL VALVE                TRICUSPID VALVE MV Area (PHT): 4.26 cm     TR Peak grad:   11.2 mmHg MV Decel Time: 178 msec     TR Vmax:        167.00 cm/s MV E velocity: 57.10 cm/s MV A velocity: 113.00 cm/s  SHUNTS MV E/A ratio:  0.51         Systemic VTI:  0.18 m                             Systemic Diam: 2.10 cm Antionette Kirks MD Electronically signed by Antionette Kirks MD Signature Date/Time: 06/30/2023/2:28:36 PM    Final    US  Venous Img Lower Bilateral (DVT) Addendum Date: 06/30/2023 ADDENDUM REPORT: 06/30/2023 08:56 ADDENDUM: Regarding the right lower extremity, thrombus appears occlusive in the femoral vein. In the left lower extremity, thrombus appears occlusive in the profunda and calf veins. Critical Value/emergent results were called by telephone at the time of interpretation on 06/30/2023 at 8:56 am to provider Dr. Mariella Shore, who verbally acknowledged these results.  Electronically Signed   By: Donnal Fusi M.D.   On: 06/30/2023 08:56   Result Date: 06/30/2023 CLINICAL DATA:  Pulmonary embolus. Lower extremity edema for 3 weeks. EXAM: BILATERAL LOWER EXTREMITY VENOUS DOPPLER ULTRASOUND TECHNIQUE: Gray-scale sonography with graded compression, as well as color Doppler and duplex ultrasound were performed to evaluate the lower extremity deep venous systems from the level of the common femoral vein and including the common femoral, femoral, profunda femoral, popliteal and calf veins including the posterior tibial, peroneal and gastrocnemius veins when visible. The superficial great saphenous vein was also interrogated. Spectral Doppler was utilized to evaluate flow at rest and with distal augmentation maneuvers in the common femoral, femoral and popliteal veins. COMPARISON:  None Available. FINDINGS: RIGHT LOWER EXTREMITY Common Femoral Vein: Positive for thrombus. Saphenofemoral Junction: Positive for thrombus. Profunda Femoral Vein: Positive for thrombus. Femoral Vein: Positive for thrombus. Popliteal Vein: Positive for thrombus. Calf Veins: Positive for thrombus. Other Findings:  None. LEFT LOWER EXTREMITY Common Femoral Vein: No evidence of thrombus. Normal compressibility, respiratory phasicity and response to augmentation. Saphenofemoral Junction: No evidence of thrombus. Normal compressibility and flow on color Doppler imaging. Profunda Femoral Vein: Positive for thrombus. Femoral Vein: Positive for thrombus. Popliteal Vein: No evidence of thrombus. Normal compressibility, respiratory phasicity and response to augmentation. Calf Veins: Positive for thrombus. Other Findings:  None. IMPRESSION: Exam is positive for extensive bilateral lower extremity deep venous thrombosis in this patient with a known history of pulmonary embolus. Electronically Signed: By: Donnal Fusi M.D. On: 06/30/2023 08:40  CT Angio Chest PE W and/or Wo Contrast Result Date: 06/29/2023 CLINICAL  DATA:  Pulmonary embolism (PE) suspected, high prob. Pt c/o SOB x 1 month, denies cough and fevers. Pt reports recently being dx with colon cancer EXAM: CT ANGIOGRAPHY CHEST WITH CONTRAST TECHNIQUE: Multidetector CT imaging of the chest was performed using the standard protocol during bolus administration of intravenous contrast. Multiplanar CT image reconstructions and MIPs were obtained to evaluate the vascular anatomy. RADIATION DOSE REDUCTION: This exam was performed according to the departmental dose-optimization program which includes automated exposure control, adjustment of the mA and/or kV according to patient size and/or use of iterative reconstruction technique. CONTRAST:  75mL OMNIPAQUE  IOHEXOL  350 MG/ML SOLN COMPARISON:  CT chest abdomen pelvis 06/12/2023 FINDINGS: Cardiovascular: Right chest wall Port-A-Cath with tip terminating within the superior vena cava. Satisfactory opacification of the pulmonary arteries to the segmental level. Bilateral distal central left and right interlobar artery pulmonary emboli extending to the bilateral lower lobes of the segmental and subsegmental levels. Limited evaluation of the subsegmental level due to timing of contrast and motion artifact. Normal heart size. No significant pericardial effusion. The thoracic aorta is normal in caliber. Mild atherosclerotic plaque of the thoracic aorta. At least three-vessel coronary artery calcifications. Mediastinum/Nodes: No enlarged mediastinal, hilar, or axillary lymph nodes. Thyroid gland, trachea, and esophagus demonstrate no significant findings. Lungs/Pleura: Right lower lobe basilar peribronchovascular peripheral ground-glass airspace opacities and slightly more consolidative peripheral consolidation.No pulmonary nodule. No pulmonary mass. No pleural effusion. No pneumothorax. Upper Abdomen: Poorly visualized known hepatic hypodense lesions. Musculoskeletal: No chest wall abnormality. No suspicious lytic or blastic osseous  lesions. No acute displaced fracture. Review of the MIP images confirms the above findings. IMPRESSION: 1. Distal central left and right interlobar pulmonary emboli extending to the bilateral lower lobes of the segmental and subsegmental levels. Extensive pulmonary embolus of the right lower lobe. Right lower lobe basilar finding may represent developing pulmonary infarction versus infection. No right heart strain. 2. Poorly visualized known hepatic metastases. These results were called by telephone at the time of interpretation on 06/29/2023 at 10:55 pm to provider [facility] , who verbally acknowledged these results. Electronically Signed   By: Morgane  Naveau M.D.   On: 06/29/2023 22:57   DG Chest Port 1 View Result Date: 06/29/2023 CLINICAL DATA:  141880 SOB (shortness of breath) [id number] EXAM: PORTABLE CHEST 1 VIEW COMPARISON:  Chest x-ray 06/26/2023 FINDINGS: Right chest wall Port-A-Cath with tip overlying the expected region of the distal superior vena cava. The heart and mediastinal contours are unchanged. Atherosclerotic plaque. No focal consolidation. No pulmonary edema. No pleural effusion. No pneumothorax. No acute osseous abnormality. IMPRESSION: 1. No active disease. 2.  Aortic Atherosclerosis (ICD10-I70.0). Electronically Signed   By: Morgane  Naveau M.D.   On: 06/29/2023 21:51   DG Chest Port 1 View Result Date: 06/26/2023 CLINICAL DATA:  Port-A-Cath placement EXAM: PORTABLE CHEST 1 VIEW COMPARISON:  February 28, 2023 FINDINGS: The heart size and mediastinal contours are within normal limits. Both lungs are clear. The visualized skeletal structures are unremarkable. Tip of the right IJ Infuse-A-Port catheter in the superior vena cava. No pneumothorax. IMPRESSION: No acute cardiopulmonary process. Electronically Signed   By: Fredrich Jefferson M.D.   On: 06/26/2023 14:20   DG C-Arm 1-60 Min-No Report Result Date: 06/26/2023 Fluoroscopy was utilized by the requesting physician.  No radiographic  interpretation.

## 2023-07-14 NOTE — Assessment & Plan Note (Addendum)
 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 Endoscopy findings, biopsy results, CT imaging findings were reviewed and discussed with patient and his wife. Unfortunately, colon cancer appears to have metastasis to liver.  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  He agrees with the plan. Proceed with cycle 1 - dose reduced FOLFOX + Bevacizumab

## 2023-07-14 NOTE — Assessment & Plan Note (Signed)
 Bilateral PE and bilateral DVT Continue Eliquis  5mg  BID

## 2023-07-14 NOTE — Progress Notes (Signed)
 Patient states he has been getting short of breath without any activity. Patient is also concerned about his weight loss.

## 2023-07-14 NOTE — Assessment & Plan Note (Signed)
 Encourage oral hydration and avoid nephrotoxins.

## 2023-07-14 NOTE — Addendum Note (Signed)
 Addended by: [name] on: 07/14/2023 03:16 PM   Modules accepted: Orders

## 2023-07-14 NOTE — Assessment & Plan Note (Signed)
 Not able to be biopsied due to recent VTE

## 2023-07-14 NOTE — Assessment & Plan Note (Addendum)
 Labs are reviewed and discussed with patient. Lab Results  Component Value Date   HGB 9.5 (L) 07/14/2023   TIBC 335 06/17/2023   IRONPCTSAT 9 (L) 06/17/2023   FERRITIN 93 06/17/2023    He tolerated with Venofer  treatments.  Will proceed with 1 dose of Venofer .

## 2023-07-14 NOTE — Assessment & Plan Note (Signed)
 Due to partially obstructed colon mass. Recommend patient to use Senokot-S 2 tablet daily and MiraLAX  daily as needed. He knows to stop bowel regimen if he experiences diarrhea

## 2023-07-14 NOTE — Assessment & Plan Note (Signed)
 Treatment plan as listed above.

## 2023-07-14 NOTE — Assessment & Plan Note (Signed)
 Recommend patient to try oxycodone  5mg  Q4-6 hours PRN.  Establish care with palliative care.

## 2023-07-15 ENCOUNTER — Encounter: Payer: Self-pay | Admitting: Oncology

## 2023-07-15 ENCOUNTER — Telehealth: Payer: Self-pay

## 2023-07-15 ENCOUNTER — Other Ambulatory Visit: Payer: Self-pay

## 2023-07-15 NOTE — Telephone Encounter (Signed)
Telephone call to patient for follow up after receiving first infusion.   No answer but left message stating we were calling to check on them.  Encouraged patient to call for any questions or concerns.   

## 2023-07-16 ENCOUNTER — Inpatient Hospital Stay (HOSPITAL_BASED_OUTPATIENT_CLINIC_OR_DEPARTMENT_OTHER): Admitting: Hospice and Palliative Medicine

## 2023-07-16 ENCOUNTER — Encounter

## 2023-07-16 ENCOUNTER — Inpatient Hospital Stay

## 2023-07-16 ENCOUNTER — Encounter: Payer: Self-pay | Admitting: Hospice and Palliative Medicine

## 2023-07-16 ENCOUNTER — Telehealth: Payer: Self-pay | Admitting: *Deleted

## 2023-07-16 VITALS — HR 85 | Temp 97.6°F | Resp 14 | Wt 209.0 lb

## 2023-07-16 VITALS — BP 110/76 | HR 84 | Temp 97.6°F | Resp 18

## 2023-07-16 DIAGNOSIS — Z515 Encounter for palliative care: Secondary | ICD-10-CM

## 2023-07-16 DIAGNOSIS — C189 Malignant neoplasm of colon, unspecified: Secondary | ICD-10-CM | POA: Diagnosis not present

## 2023-07-16 DIAGNOSIS — R29898 Other symptoms and signs involving the musculoskeletal system: Secondary | ICD-10-CM

## 2023-07-16 DIAGNOSIS — Z5111 Encounter for antineoplastic chemotherapy: Secondary | ICD-10-CM | POA: Diagnosis not present

## 2023-07-16 MED ORDER — HEPARIN SOD (PORK) LOCK FLUSH 100 UNIT/ML IV SOLN
500.0000 [IU] | Freq: Once | INTRAVENOUS | Status: AC | PRN
  Administered 2023-07-16: 500 [IU]
  Filled 2023-07-16: qty 5

## 2023-07-16 MED ORDER — SODIUM CHLORIDE 0.9% FLUSH
10.0000 mL | INTRAVENOUS | Status: DC | PRN
  Administered 2023-07-16: 10 mL
  Filled 2023-07-16: qty 10

## 2023-07-16 NOTE — Progress Notes (Signed)
 Palliative Medicine [facility] at [facility] [phone] [phone]   Name: Trevor Flores Date: 07/16/2023 MRN: [id number]  [name]: [birth year]  Patient Care Team: Antonio Baumgarten, MD as PCP - General (Internal Medicine) Devorah Fonder, MD as PCP - Cardiology (Cardiology) Devorah Fonder, MD as Consulting Physician (Cardiology) Christi Coward, MD (Internal Medicine) Marquita Situ, Magali Schmitz, MD (General Surgery) Rochell Chroman, RN as Oncology Nurse Navigator [name], MD as Consulting Physician (Oncology)    REASON FOR CONSULTATION: Trevor Flores is a 63 y.o. 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  Patient started on systemic chemotherapy.  Was referred to palliative care to address goals and manage ongoing symptoms.  SOCIAL HISTORY:     reports that he has never smoked. He has quit using smokeless tobacco.  His smokeless tobacco use included chew. He reports that he does not drink alcohol and does not use drugs.  Patient is married lives at home with his wife and son.  Patient previously worked as a Naval architect.  ADVANCE DIRECTIVES:    CODE STATUS:   PAST MEDICAL HISTORY: Past Medical History:  Diagnosis Date   Adenocarcinoma of colon metastatic to liver (HCC) 2025   a.) stage IV   Anginal pain (HCC)    Aortic atherosclerosis (HCC)    Chronic kidney disease    Coronary artery disease    DDD (degenerative disc disease), cervical    Diastolic dysfunction    Dyspnea    ED (erectile dysfunction)    a.) on PDE5i (sildenafil) PRN   Generalized osteoarthritis of multiple sites    GERD (gastroesophageal reflux disease)    Heart murmur    a. 12/2011 Echo: EF 60%, no rwma.   Hemiparesis affecting right side as late effect of cerebrovascular accident (CVA) (HCC)    History of stress test    a. 04/2004 Ex  MV: Ex time 10:30. Hypertensive response. No ischemia/infarct.   Hyperlipidemia    Hypertension    Long-term use of aspirin  therapy    Palpitations    Parkinson disease (HCC)    Personal history of gout    Possible Lacunar infarction (HCC)    a. 12/2011 Head CT: CSF density lateral to R cerebral peduncle - ? prior lacunar infarct.   Pre-syncope    a. 06/2014 Monitor: Sinus rhythm. No significant arrhythmia.   Resting tremor    Stroke [facility])    Vascular parkinsonism (HCC)    Vertigo     PAST SURGICAL HISTORY:  Past Surgical History:  Procedure Laterality Date   APPENDECTOMY     COLONOSCOPY WITH PROPOFOL  N/A 02/04/2023   Procedure: COLONOSCOPY WITH PROPOFOL ;  Surgeon: Toledo, Alphonsus Jeans, MD;  Location: [facility] ENDOSCOPY;  Service: Gastroenterology;  Laterality: N/A;   COLONOSCOPY WITH PROPOFOL  N/A 06/10/2023   Procedure: COLONOSCOPY WITH PROPOFOL ;  Surgeon: Toledo, Alphonsus Jeans, MD;  Location: [facility] ENDOSCOPY;  Service: Gastroenterology;  Laterality: N/A;   ESOPHAGOGASTRODUODENOSCOPY (EGD) WITH PROPOFOL  N/A 02/04/2023   Procedure: ESOPHAGOGASTRODUODENOSCOPY (EGD) WITH PROPOFOL ;  Surgeon: Toledo, Alphonsus Jeans, MD;  Location: [facility] ENDOSCOPY;  Service: Gastroenterology;  Laterality: N/A;   ESOPHAGOGASTRODUODENOSCOPY (EGD) WITH PROPOFOL  N/A 06/10/2023   Procedure: ESOPHAGOGASTRODUODENOSCOPY (EGD) WITH PROPOFOL ;  Surgeon: Toledo, Alphonsus Jeans, MD;  Location: [facility] ENDOSCOPY;  Service: Gastroenterology;  Laterality: N/A;   HEMORRHOID BANDING  ?   Dr Bradford Cadet   IVC FILTER INSERTION N/A  07/02/2023   Procedure: IVC FILTER INSERTION;  Surgeon: Celso College, MD;  Location: [facility] INVASIVE CV LAB;  Service: Cardiovascular;  Laterality: N/A;   POLYPECTOMY  06/10/2023   Procedure: POLYPECTOMY, INTESTINE;  Surgeon: Toledo, Alphonsus Jeans, MD;  Location: [facility] ENDOSCOPY;  Service: Gastroenterology;;   PORTACATH PLACEMENT N/A 06/26/2023   Procedure: INSERTION, TUNNELED CENTRAL VENOUS DEVICE, WITH PORT;  Surgeon: [name], MD;  Location: [facility] ORS;  Service: General;  Laterality: N/A;   PULMONARY THROMBECTOMY Bilateral 07/02/2023   Procedure: PULMONARY THROMBECTOMY;  Surgeon: Celso College, MD;  Location: [facility] INVASIVE CV LAB;  Service: Cardiovascular;  Laterality: Bilateral;    HEMATOLOGY/ONCOLOGY HISTORY:  Oncology History  Adenocarcinoma of colon (HCC)  06/12/2023 Imaging   CT chest abdomen pelvis with contrast showed  1. Large, circumferential mass of the very redundant mid sigmoid colon, measuring 6.5 cm in length and 4.2 x 4.0 cm in diameter, consistent with primary colonic malignancy. 2. Enlarged lymph nodes or metastatic soft tissue mass in the sigmoid mesocolon as well as an enlarged left iliac lymph node, consistent with nodal metastatic disease. 3. Numerous bulky hypodense liver lesions of varying sizes seen throughout the liver, consistent with hepatic metastatic disease. 4. No evidence of lymphadenopathy or metastatic disease in the chest. 5. Coronary artery disease.     06/17/2023 Initial Diagnosis   Adenocarcinoma of colon (HCC)  He has been experiencing increased constipation, requiring the use of Miralax  and Colace every two days to manage bowel movements. Despite these measures, constipation persists.  He has also experienced unintentional weight loss 06/10/2023 patient's status post upper endoscopy and colonoscopy. Colonoscopy showed ascending colon 15 mm polyp which was resected and retrieved. A fungating infiltrative ulcerated partially obstructing large mass found in the mid descending colon.  The mass was circumferential.  5 cm in length.  Mass was biopsied.  Pathology showed 1. Ascending  Colon Polyp, hot snare :       - TUBULAR ADENOMA.       - NEGATIVE FOR HIGH-GRADE DYSPLASIA AND MALIGNANCY.        2. Sigmoid Colon Biopsy, mass, mid, cbx :       - INVASIVE MODERATELY DIFFERENTIATED COLORECTAL ADENOCARCINOMA WITH MUCINOUS       FEATURES.        06/17/2023 Cancer  Staging   Staging form: Colon and Rectum, AJCC 8th Edition - Clinical stage from 06/17/2023: Stage Unknown (cTX, cN1, cM1) - Signed by [name], MD on 06/17/2023 Stage prefix: Initial diagnosis   07/14/2023 -  Chemotherapy   Patient is on Treatment Plan : COLORECTAL FOLFOX + Bevacizumab q14d       ALLERGIES:  is allergic to other and hydralazine .  MEDICATIONS:  Current Outpatient Medications  Medication Sig Dispense Refill   albuterol  (VENTOLIN  HFA) 108 (90 Base) MCG/ACT inhaler Inhale 1-2 puffs into the lungs every 4 (four) hours as needed.     allopurinol (ZYLOPRIM) 100 MG tablet Take 100 mg by mouth daily.     apixaban  (ELIQUIS ) 5 MG TABS tablet Take 2 tablets (10 mg total) by mouth 2 (two) times daily for 6 days, THEN 1 tablet (5 mg total) 2 (two) times daily. 100 tablet 0   carbidopa -levodopa  (SINEMET  IR) 25-100 MG tablet Take 1 tablet by mouth 3 (three) times daily.     diltiazem  (TIAZAC ) 360 MG 24 hr capsule Take 1 capsule (360 mg total) by mouth daily. 90 capsule 3   Docusate Sodium  (DSS) 100 MG CAPS Take 1 capsule  by mouth 2 (two) times daily.     doxazosin  (CARDURA ) 1 MG tablet TAKE 1 TABLET BY MOUTH DAILY AS NEEDED FOR PRESSURE GREATER THAN 150 90 tablet 0   fluticasone (FLONASE) 50 MCG/ACT nasal spray Place into both nostrils daily as needed for allergies or rhinitis.     loratadine (CLARITIN) 10 MG tablet Take 10 mg by mouth daily as needed for allergies.     losartan  (COZAAR ) 100 MG tablet TAKE 1 TABLET(100 MG) BY MOUTH DAILY IN THE MORNING 90 tablet 2   meclizine  (ANTIVERT ) 12.5 MG tablet Take 1 tablet (12.5 mg total) by mouth 3 (three) times daily as needed for dizziness. 30 tablet 0   metoprolol  succinate (TOPROL -XL) 25 MG 24 hr tablet TAKE 1 TABLET(25 MG) BY MOUTH EVERY EVENING WITH FOOD (Patient taking differently: Take 12.5 mg by mouth daily.) 90 tablet 2   oxyCODONE  (OXY IR/ROXICODONE ) 5 MG immediate release tablet Take 1 tablet (5 mg total) by mouth every 6 (six) hours  as needed for severe pain (pain score 7-10) or moderate pain (pain score 4-6). (Patient taking differently: Take 2.5 mg by mouth every 6 (six) hours as needed for severe pain (pain score 7-10) or moderate pain (pain score 4-6).) 60 tablet 0   polyethylene glycol powder (GLYCOLAX /MIRALAX ) 17 GM/SCOOP powder Take 17 g by mouth daily as needed.     prochlorperazine  (COMPAZINE ) 10 MG tablet Take 1 tablet (10 mg total) by mouth every 6 (six) hours as needed for nausea or vomiting.     rosuvastatin  (CRESTOR ) 5 MG tablet Take 5 mg by mouth daily.     senna-docusate (SENOKOT-S) 8.6-50 MG tablet Take 2 tablets by mouth daily. 60 tablet 1   sildenafil (VIAGRA) 25 MG tablet Take 25 mg by mouth as needed.     SPIRIVA HANDIHALER 18 MCG inhalation capsule Place 18 mcg into inhaler and inhale daily.     Vitamin D, Ergocalciferol, (DRISDOL) 1.25 MG (50000 UNIT) CAPS capsule Take 50,000 Units by mouth once a week.     ondansetron  (ZOFRAN ) 8 MG tablet Take 1 tablet (8 mg total) by mouth every 8 (eight) hours as needed for nausea or vomiting. Start on the third day after chemotherapy. (Patient not taking: Reported on 06/30/2023) 30 tablet 1   pantoprazole  (PROTONIX ) 40 MG tablet Take 1 tablet (40 mg total) by mouth daily. 30 tablet 0   No current facility-administered medications for this visit.    VITAL SIGNS: Pulse 85   Temp 97.6 F (36.4 C) (Tympanic)   Resp 14   Wt 209 lb (94.8 kg)   SpO2 97%   BMI 28.35 kg/m  Filed Weights   07/16/23 1429  Weight: 209 lb (94.8 kg)    Estimated body mass index is 28.35 kg/m as calculated from the following:   Height as of 07/08/23: 6' (1.829 m).   Weight as of this encounter: 209 lb (94.8 kg).  LABS: CBC:    Component Value Date/Time   WBC 7.1 07/14/2023 0927   WBC 6.5 07/08/2023 1233   HGB 9.5 (L) 07/14/2023 0927   HGB 15.0 12/05/2011 0955   HCT 31.1 (L) 07/14/2023 0927   HCT 43.2 12/05/2011 0955   PLT 307 07/14/2023 0927   PLT 228 12/05/2011 0955   MCV  85.9 07/14/2023 0927   MCV 84 12/05/2011 0955   NEUTROABS 5.0 07/14/2023 0927   LYMPHSABS 1.3 07/14/2023 0927   MONOABS 0.6 07/14/2023 0927   EOSABS 0.0 07/14/2023 0927   BASOSABS 0.1  07/14/2023 0927   Comprehensive Metabolic Panel:    Component Value Date/Time   NA 137 07/14/2023 0927   NA 139 03/19/2023 0934   NA 142 12/05/2011 0955   K 3.6 07/14/2023 0927   K 3.7 12/05/2011 0955   CL 101 07/14/2023 0927   CL 107 12/05/2011 0955   CO2 22 07/14/2023 0927   CO2 27 12/05/2011 0955   BUN 17 07/14/2023 0927   BUN 16 03/19/2023 0934   BUN 14 12/05/2011 0955   CREATININE 1.02 07/14/2023 0927   CREATININE 1.38 (H) 12/05/2011 0955   GLUCOSE 85 07/14/2023 0927   GLUCOSE 95 12/05/2011 0955   CALCIUM  8.6 (L) 07/14/2023 0927   CALCIUM  9.0 12/05/2011 0955   AST 75 (H) 07/14/2023 0927   ALT 8 07/14/2023 0927   ALT 29 12/05/2011 0955   ALKPHOS 274 (H) 07/14/2023 0927   ALKPHOS 122 12/05/2011 0955   BILITOT 1.1 07/14/2023 0927   PROT 7.8 07/14/2023 0927   PROT 8.8 (H) 12/05/2011 0955   ALBUMIN 3.2 (L) 07/14/2023 0927   ALBUMIN 4.2 12/05/2011 0955    RADIOGRAPHIC STUDIES: CT HEAD WO CONTRAST ( ) Result Date: 07/08/2023 CLINICAL DATA:  Dizziness EXAM: CT HEAD WITHOUT CONTRAST TECHNIQUE: Contiguous axial images were obtained from the base of the skull through the vertex without intravenous contrast. RADIATION DOSE REDUCTION: This exam was performed according to the departmental dose-optimization program which includes automated exposure control, adjustment of the mA and/or kV according to patient size and/or use of iterative reconstruction technique. COMPARISON:  12/05/2011 FINDINGS: Brain: No acute intracranial abnormality. Specifically, no hemorrhage, hydrocephalus, mass lesion, acute infarction, or significant intracranial injury. Vascular: No hyperdense vessel or unexpected calcification. Skull: No acute calvarial abnormality. Sinuses/Orbits: No acute findings Other: None IMPRESSION: No  acute intracranial abnormality. Electronically Signed   By: Janeece Mechanic M.D.   On: 07/08/2023 20:24   PERIPHERAL VASCULAR CATHETERIZATION Result Date: 07/02/2023 See surgical note for result.  ECHOCARDIOGRAM COMPLETE Result Date: 06/30/2023    ECHOCARDIOGRAM REPORT   Patient Name:   SEMAJ TEEMS Date of Exam: 06/30/2023 Medical Rec #:  [id number]       Height:       72.0 in Accession #:    [id number]      Weight:       205.0 lb Date of Birth:  1960-12-08       BSA:          2.153 m Patient Age:    63 years        BP:           112/80 mmHg Patient Gender: M               HR:           59 bpm. Exam Location:  [facility] Procedure: 2D Echo, Cardiac Doppler and Color Doppler (Both Spectral and Color            Flow Doppler were utilized during procedure). Indications:     Congestive heart failure I50.9  History:         Patient has prior history of Echocardiogram examinations, most                  recent 04/04/2023. CHF, CAD, Arrythmias:LBBB; Risk                  Factors:Hypertension.  Sonographer:     Broadus Canes Referring Phys:  [id number] Lanetta Pion Diagnosing Phys: Antionette Kirks MD  Sonographer  Comments: Suboptimal apical window. Image acquisition challenging due to respiratory motion. IMPRESSIONS  1. Left ventricular ejection fraction, by estimation, is 65 to 70%. The left ventricle has normal function. The left ventricle has no regional wall motion abnormalities. There is mild left ventricular hypertrophy. Left ventricular diastolic parameters are consistent with Grade I diastolic dysfunction (impaired relaxation).  2. Right ventricular systolic function is normal. The right ventricular size is normal. Tricuspid regurgitation signal is inadequate for assessing PA pressure.  3. The mitral valve is normal in structure. No evidence of mitral valve regurgitation. No evidence of mitral stenosis.  4. The aortic valve is normal in structure. Aortic valve regurgitation is not visualized. Aortic valve sclerosis is  present, with no evidence of aortic valve stenosis. FINDINGS  Left Ventricle: Left ventricular ejection fraction, by estimation, is 65 to 70%. The left ventricle has normal function. The left ventricle has no regional wall motion abnormalities. The left ventricular internal cavity size was normal in size. There is  mild left ventricular hypertrophy. Left ventricular diastolic parameters are consistent with Grade I diastolic dysfunction (impaired relaxation). Right Ventricle: The right ventricular size is normal. No increase in right ventricular wall thickness. Right ventricular systolic function is normal. Tricuspid regurgitation signal is inadequate for assessing PA pressure. Left Atrium: Left atrial size was normal in size. Right Atrium: Right atrial size was normal in size. Pericardium: There is no evidence of pericardial effusion. Mitral Valve: The mitral valve is normal in structure. No evidence of mitral valve regurgitation. No evidence of mitral valve stenosis. Tricuspid Valve: The tricuspid valve is normal in structure. Tricuspid valve regurgitation is not demonstrated. No evidence of tricuspid stenosis. Aortic Valve: The aortic valve is normal in structure. Aortic valve regurgitation is not visualized. Aortic valve sclerosis is present, with no evidence of aortic valve stenosis. Aortic valve mean gradient measures 2.5 mmHg. Aortic valve peak gradient measures 4.5 mmHg. Aortic valve area, by VTI measures 3.16 cm. Pulmonic Valve: The pulmonic valve was normal in structure. Pulmonic valve regurgitation is trivial. No evidence of pulmonic stenosis. Aorta: The aortic root is normal in size and structure. Venous: The inferior vena cava was not well visualized. IAS/Shunts: No atrial level shunt detected by color flow Doppler.  LEFT VENTRICLE PLAX 2D LVIDd:         4.60 cm   Diastology LVIDs:         2.80 cm   LV e' medial:    6.96 cm/s LV PW:         1.00 cm   LV E/e' medial:  8.2 LV IVS:        1.20 cm   LV e'  lateral:   9.57 cm/s LVOT diam:     2.10 cm   LV E/e' lateral: 6.0 LV SV:         62 LV SV Index:   29 LVOT Area:     3.46 cm  RIGHT VENTRICLE RV Basal diam:  2.80 cm RV Mid diam:    2.50 cm LEFT ATRIUM           Index        RIGHT ATRIUM          Index LA diam:      4.90 cm 2.28 cm/m   RA Area:     8.66 cm LA Vol (A4C): 41.9 ml 19.46 ml/m  RA Volume:   16.40 ml 7.62 ml/m  AORTIC VALVE AV Area (Vmax):    3.06 cm AV Area (  Vmean):   2.92 cm AV Area (VTI):     3.16 cm AV Vmax:           106.50 cm/s AV Vmean:          71.850 cm/s AV VTI:            0.197 m AV Peak Grad:      4.5 mmHg AV Mean Grad:      2.5 mmHg LVOT Vmax:         94.20 cm/s LVOT Vmean:        60.500 cm/s LVOT VTI:          0.180 m LVOT/AV VTI ratio: 0.91  AORTA Ao Root diam: 3.50 cm MITRAL VALVE                TRICUSPID VALVE MV Area (PHT): 4.26 cm     TR Peak grad:   11.2 mmHg MV Decel Time: 178 msec     TR Vmax:        167.00 cm/s MV E velocity: 57.10 cm/s MV A velocity: 113.00 cm/s  SHUNTS MV E/A ratio:  0.51         Systemic VTI:  0.18 m                             Systemic Diam: 2.10 cm Antionette Kirks MD Electronically signed by Antionette Kirks MD Signature Date/Time: 06/30/2023/2:28:36 PM    Final    US  Venous Img Lower Bilateral (DVT) Addendum Date: 06/30/2023 ADDENDUM REPORT: 06/30/2023 08:56 ADDENDUM: Regarding the right lower extremity, thrombus appears occlusive in the femoral vein. In the left lower extremity, thrombus appears occlusive in the profunda and calf veins. Critical Value/emergent results were called by telephone at the time of interpretation on 06/30/2023 at 8:56 am to provider Dr. Mariella Shore, who verbally acknowledged these results. Electronically Signed   By: Donnal Fusi M.D.   On: 06/30/2023 08:56   Result Date: 06/30/2023 CLINICAL DATA:  Pulmonary embolus. Lower extremity edema for 3 weeks. EXAM: BILATERAL LOWER EXTREMITY VENOUS DOPPLER ULTRASOUND TECHNIQUE: Gray-scale sonography with graded compression, as well as  color Doppler and duplex ultrasound were performed to evaluate the lower extremity deep venous systems from the level of the common femoral vein and including the common femoral, femoral, profunda femoral, popliteal and calf veins including the posterior tibial, peroneal and gastrocnemius veins when visible. The superficial great saphenous vein was also interrogated. Spectral Doppler was utilized to evaluate flow at rest and with distal augmentation maneuvers in the common femoral, femoral and popliteal veins. COMPARISON:  None Available. FINDINGS: RIGHT LOWER EXTREMITY Common Femoral Vein: Positive for thrombus. Saphenofemoral Junction: Positive for thrombus. Profunda Femoral Vein: Positive for thrombus. Femoral Vein: Positive for thrombus. Popliteal Vein: Positive for thrombus. Calf Veins: Positive for thrombus. Other Findings:  None. LEFT LOWER EXTREMITY Common Femoral Vein: No evidence of thrombus. Normal compressibility, respiratory phasicity and response to augmentation. Saphenofemoral Junction: No evidence of thrombus. Normal compressibility and flow on color Doppler imaging. Profunda Femoral Vein: Positive for thrombus. Femoral Vein: Positive for thrombus. Popliteal Vein: No evidence of thrombus. Normal compressibility, respiratory phasicity and response to augmentation. Calf Veins: Positive for thrombus. Other Findings:  None. IMPRESSION: Exam is positive for extensive bilateral lower extremity deep venous thrombosis in this patient with a known history of pulmonary embolus. Electronically Signed: By: Donnal Fusi M.D. On: 06/30/2023 08:40   CT Angio Chest PE W and/or Wo Contrast  Result Date: 06/29/2023 CLINICAL DATA:  Pulmonary embolism (PE) suspected, high prob. Pt c/o SOB x 1 month, denies cough and fevers. Pt reports recently being dx with colon cancer EXAM: CT ANGIOGRAPHY CHEST WITH CONTRAST TECHNIQUE: Multidetector CT imaging of the chest was performed using the standard protocol during bolus  administration of intravenous contrast. Multiplanar CT image reconstructions and MIPs were obtained to evaluate the vascular anatomy. RADIATION DOSE REDUCTION: This exam was performed according to the departmental dose-optimization program which includes automated exposure control, adjustment of the mA and/or kV according to patient size and/or use of iterative reconstruction technique. CONTRAST:  75mL OMNIPAQUE  IOHEXOL  350 MG/ML SOLN COMPARISON:  CT chest abdomen pelvis 06/12/2023 FINDINGS: Cardiovascular: Right chest wall Port-A-Cath with tip terminating within the superior vena cava. Satisfactory opacification of the pulmonary arteries to the segmental level. Bilateral distal central left and right interlobar artery pulmonary emboli extending to the bilateral lower lobes of the segmental and subsegmental levels. Limited evaluation of the subsegmental level due to timing of contrast and motion artifact. Normal heart size. No significant pericardial effusion. The thoracic aorta is normal in caliber. Mild atherosclerotic plaque of the thoracic aorta. At least three-vessel coronary artery calcifications. Mediastinum/Nodes: No enlarged mediastinal, hilar, or axillary lymph nodes. Thyroid gland, trachea, and esophagus demonstrate no significant findings. Lungs/Pleura: Right lower lobe basilar peribronchovascular peripheral ground-glass airspace opacities and slightly more consolidative peripheral consolidation.No pulmonary nodule. No pulmonary mass. No pleural effusion. No pneumothorax. Upper Abdomen: Poorly visualized known hepatic hypodense lesions. Musculoskeletal: No chest wall abnormality. No suspicious lytic or blastic osseous lesions. No acute displaced fracture. Review of the MIP images confirms the above findings. IMPRESSION: 1. Distal central left and right interlobar pulmonary emboli extending to the bilateral lower lobes of the segmental and subsegmental levels. Extensive pulmonary embolus of the right  lower lobe. Right lower lobe basilar finding may represent developing pulmonary infarction versus infection. No right heart strain. 2. Poorly visualized known hepatic metastases. These results were called by telephone at the time of interpretation on 06/29/2023 at 10:55 pm to provider [facility] , who verbally acknowledged these results. Electronically Signed   By: Morgane  Naveau M.D.   On: 06/29/2023 22:57   DG Chest Port 1 View Result Date: 06/29/2023 CLINICAL DATA:  [id number] SOB (shortness of breath) [id number] EXAM: PORTABLE CHEST 1 VIEW COMPARISON:  Chest x-ray 06/26/2023 FINDINGS: Right chest wall Port-A-Cath with tip overlying the expected region of the distal superior vena cava. The heart and mediastinal contours are unchanged. Atherosclerotic plaque. No focal consolidation. No pulmonary edema. No pleural effusion. No pneumothorax. No acute osseous abnormality. IMPRESSION: 1. No active disease. 2.  Aortic Atherosclerosis (ICD10-I70.0). Electronically Signed   By: Morgane  Naveau M.D.   On: 06/29/2023 21:51   DG Chest Port 1 View Result Date: 06/26/2023 CLINICAL DATA:  Port-A-Cath placement EXAM: PORTABLE CHEST 1 VIEW COMPARISON:  February 28, 2023 FINDINGS: The heart size and mediastinal contours are within normal limits. Both lungs are clear. The visualized skeletal structures are unremarkable. Tip of the right IJ Infuse-A-Port catheter in the superior vena cava. No pneumothorax. IMPRESSION: No acute cardiopulmonary process. Electronically Signed   By: Fredrich Jefferson M.D.   On: 06/26/2023 14:20   DG C-Arm 1-60 Min-No Report Result Date: 06/26/2023 Fluoroscopy was utilized by the requesting physician.  No radiographic interpretation.    PERFORMANCE STATUS (ECOG) : 2 - Symptomatic, <50% confined to bed  Review of Systems Unless otherwise noted, a complete review of systems is negative.  Physical Exam  General: NAD Pulmonary: Unlabored Extremities: no edema, no joint deformities Skin: no  rashes Neurological: Weakness but otherwise nonfocal  IMPRESSION: I met with patient and son today.  Introduced palliative care services and attempted to establish therapeutic rapport.  Both patient and son verbalized understanding that he has stage IV colon cancer.  Both agree with treatment and recognize that cancer is incurable.  Son states the prognosis was given months to years depending upon patient's response to treatment.  Patient received cycle 1 FOLFOX plus Bev on 07/14/2023.  Denies any symptomatic complaints or concerns today other than persistent weakness and chronic dyspnea.  At baseline, patient lives at home with his wife and son.  His performance status is poor and patient is mostly sedentary.  He ambulates with use of a walker but sometimes has difficulty with that.  He relies on family for most ADLs including bathing and dressing.  Son reports that patient has had chronic dyspnea since his hospitalization for PE.  Patient denies chest pain or new symptomatic complaints.  Will consult home health PT, although rehab potential may be poor.  Will also send referrals to community palliative care and social work.  Patient sent home with advanced directives and a MOST form to review with family.  Patient undecided on whether he would want resuscitation or comfort measures at end-of-life.  PLAN: - Continue current scope of treatment - Referrals Home health PT, social work, Journalist, newspaper, community palliative care, [facility] - DME: lightweight wheelchair - ACP/MOST form reviewed - RTC 2-3 weeks   Patient expressed understanding and was in agreement with this plan. He also understands that He can call the clinic at any time with any questions, concerns, or complaints.     Time Total: 20 minutes  Visit consisted of counseling and education dealing with the complex and emotionally intense issues of symptom management and palliative care in the setting of serious and potentially  life-threatening illness.Greater than 50%  of this time was spent counseling and coordinating care related to the above assessment and plan.  Signed by: Gerilyn Kobus, PhD, NP-C

## 2023-07-16 NOTE — Telephone Encounter (Signed)
 Per Shaun at [facility]. Confirmed receipt of referral. [facility] has serviced patient before as a client. Confirmed pt's insurance is w/in Dietitian for [employer] plans. Patient will be contacted by agency to set up an apt for [facility].

## 2023-07-16 NOTE — Progress Notes (Signed)
 [facility] PT referral placed. I message Shaun Arbaugh-Morales  with [facility] to see if they can service this patient.

## 2023-07-17 ENCOUNTER — Telehealth: Payer: Self-pay

## 2023-07-17 NOTE — Telephone Encounter (Signed)
 Clinical Social Work was referred by medical provider for assessment of psychosocial needs.  CSW attempted to contact patient by phone.  Left voicemail with contact information and request for return call.

## 2023-07-18 ENCOUNTER — Encounter: Payer: Self-pay | Admitting: *Deleted

## 2023-07-18 ENCOUNTER — Telehealth: Payer: Self-pay | Admitting: *Deleted

## 2023-07-18 NOTE — Telephone Encounter (Signed)
 DME for wheelchair submitted to [employer] via [place] online ordering

## 2023-07-21 ENCOUNTER — Emergency Department

## 2023-07-21 ENCOUNTER — Inpatient Hospital Stay
Admission: EM | Admit: 2023-07-21 | Discharge: 2023-08-03 | DRG: 064 | Disposition: E | Attending: Student | Admitting: Student

## 2023-07-21 ENCOUNTER — Inpatient Hospital Stay

## 2023-07-21 ENCOUNTER — Other Ambulatory Visit: Payer: Self-pay

## 2023-07-21 ENCOUNTER — Encounter: Payer: Self-pay | Admitting: Emergency Medicine

## 2023-07-21 ENCOUNTER — Inpatient Hospital Stay: Admitting: Oncology

## 2023-07-21 DIAGNOSIS — G9341 Metabolic encephalopathy: Secondary | ICD-10-CM

## 2023-07-21 DIAGNOSIS — D696 Thrombocytopenia, unspecified: Secondary | ICD-10-CM | POA: Diagnosis present

## 2023-07-21 DIAGNOSIS — R4182 Altered mental status, unspecified: Secondary | ICD-10-CM | POA: Diagnosis not present

## 2023-07-21 DIAGNOSIS — I2699 Other pulmonary embolism without acute cor pulmonale: Secondary | ICD-10-CM | POA: Diagnosis present

## 2023-07-21 DIAGNOSIS — I10 Essential (primary) hypertension: Secondary | ICD-10-CM | POA: Diagnosis not present

## 2023-07-21 DIAGNOSIS — Z7901 Long term (current) use of anticoagulants: Secondary | ICD-10-CM

## 2023-07-21 DIAGNOSIS — Z95828 Presence of other vascular implants and grafts: Secondary | ICD-10-CM | POA: Diagnosis not present

## 2023-07-21 DIAGNOSIS — E785 Hyperlipidemia, unspecified: Secondary | ICD-10-CM | POA: Diagnosis present

## 2023-07-21 DIAGNOSIS — I2510 Atherosclerotic heart disease of native coronary artery without angina pectoris: Secondary | ICD-10-CM | POA: Diagnosis present

## 2023-07-21 DIAGNOSIS — I69351 Hemiplegia and hemiparesis following cerebral infarction affecting right dominant side: Secondary | ICD-10-CM

## 2023-07-21 DIAGNOSIS — D6869 Other thrombophilia: Secondary | ICD-10-CM | POA: Diagnosis present

## 2023-07-21 DIAGNOSIS — C787 Secondary malignant neoplasm of liver and intrahepatic bile duct: Secondary | ICD-10-CM | POA: Diagnosis present

## 2023-07-21 DIAGNOSIS — I82513 Chronic embolism and thrombosis of femoral vein, bilateral: Secondary | ICD-10-CM | POA: Diagnosis present

## 2023-07-21 DIAGNOSIS — E663 Overweight: Secondary | ICD-10-CM | POA: Diagnosis present

## 2023-07-21 DIAGNOSIS — M109 Gout, unspecified: Secondary | ICD-10-CM | POA: Diagnosis present

## 2023-07-21 DIAGNOSIS — I130 Hypertensive heart and chronic kidney disease with heart failure and stage 1 through stage 4 chronic kidney disease, or unspecified chronic kidney disease: Secondary | ICD-10-CM | POA: Diagnosis present

## 2023-07-21 DIAGNOSIS — R402A Nontraumatic coma due to underlying condition: Secondary | ICD-10-CM | POA: Diagnosis present

## 2023-07-21 DIAGNOSIS — R7989 Other specified abnormal findings of blood chemistry: Secondary | ICD-10-CM | POA: Diagnosis present

## 2023-07-21 DIAGNOSIS — D509 Iron deficiency anemia, unspecified: Secondary | ICD-10-CM | POA: Diagnosis present

## 2023-07-21 DIAGNOSIS — I63412 Cerebral infarction due to embolism of left middle cerebral artery: Principal | ICD-10-CM | POA: Diagnosis present

## 2023-07-21 DIAGNOSIS — R569 Unspecified convulsions: Secondary | ICD-10-CM

## 2023-07-21 DIAGNOSIS — N1831 Chronic kidney disease, stage 3a: Secondary | ICD-10-CM | POA: Diagnosis present

## 2023-07-21 DIAGNOSIS — G20A1 Parkinson's disease without dyskinesia, without mention of fluctuations: Secondary | ICD-10-CM | POA: Diagnosis present

## 2023-07-21 DIAGNOSIS — R4020 Unspecified coma: Secondary | ICD-10-CM | POA: Diagnosis not present

## 2023-07-21 DIAGNOSIS — I6340 Cerebral infarction due to embolism of unspecified cerebral artery: Secondary | ICD-10-CM | POA: Diagnosis present

## 2023-07-21 DIAGNOSIS — Z66 Do not resuscitate: Secondary | ICD-10-CM | POA: Diagnosis present

## 2023-07-21 DIAGNOSIS — Z1152 Encounter for screening for COVID-19: Secondary | ICD-10-CM | POA: Diagnosis not present

## 2023-07-21 DIAGNOSIS — I5032 Chronic diastolic (congestive) heart failure: Secondary | ICD-10-CM | POA: Diagnosis present

## 2023-07-21 DIAGNOSIS — I639 Cerebral infarction, unspecified: Secondary | ICD-10-CM | POA: Diagnosis present

## 2023-07-21 DIAGNOSIS — Z7982 Long term (current) use of aspirin: Secondary | ICD-10-CM

## 2023-07-21 DIAGNOSIS — R29723 NIHSS score 23: Secondary | ICD-10-CM | POA: Diagnosis present

## 2023-07-21 DIAGNOSIS — Z6827 Body mass index (BMI) 27.0-27.9, adult: Secondary | ICD-10-CM

## 2023-07-21 DIAGNOSIS — Z7189 Other specified counseling: Secondary | ICD-10-CM | POA: Diagnosis not present

## 2023-07-21 DIAGNOSIS — R6510 Systemic inflammatory response syndrome (SIRS) of non-infectious origin without acute organ dysfunction: Secondary | ICD-10-CM | POA: Diagnosis present

## 2023-07-21 DIAGNOSIS — C189 Malignant neoplasm of colon, unspecified: Secondary | ICD-10-CM | POA: Diagnosis present

## 2023-07-21 DIAGNOSIS — Z515 Encounter for palliative care: Secondary | ICD-10-CM | POA: Diagnosis not present

## 2023-07-21 LAB — CBG MONITORING, ED: Glucose-Capillary: 76 mg/dL (ref 70–99)

## 2023-07-21 LAB — COMPREHENSIVE METABOLIC PANEL WITH GFR
ALT: 44 U/L (ref 0–44)
AST: 176 U/L — ABNORMAL HIGH (ref 15–41)
Albumin: 3 g/dL — ABNORMAL LOW (ref 3.5–5.0)
Alkaline Phosphatase: 216 U/L — ABNORMAL HIGH (ref 38–126)
Anion gap: 16 — ABNORMAL HIGH (ref 5–15)
BUN: 31 mg/dL — ABNORMAL HIGH (ref 8–23)
CO2: 21 mmol/L — ABNORMAL LOW (ref 22–32)
Calcium: 8.5 mg/dL — ABNORMAL LOW (ref 8.9–10.3)
Chloride: 106 mmol/L (ref 98–111)
Creatinine, Ser: 1.26 mg/dL — ABNORMAL HIGH (ref 0.61–1.24)
GFR, Estimated: >60 mL/min (ref >60)
Glucose, Bld: 82 mg/dL (ref 70–99)
Potassium: 4 mmol/L (ref 3.5–5.1)
Sodium: 143 mmol/L (ref 135–145)
Total Bilirubin: 1.6 mg/dL — ABNORMAL HIGH (ref 0.0–1.2)
Total Protein: 7.6 g/dL (ref 6.5–8.1)

## 2023-07-21 LAB — CBC WITH DIFFERENTIAL/PLATELET
Abs Immature Granulocytes: 0.05 10*3/uL (ref 0.00–0.07)
Basophils Absolute: 0 10*3/uL (ref 0.0–0.1)
Basophils Relative: 1 %
Eosinophils Absolute: 0 10*3/uL (ref 0.0–0.5)
Eosinophils Relative: 0 %
HCT: 32.1 % — ABNORMAL LOW (ref 39.0–52.0)
Hemoglobin: 9.7 g/dL — ABNORMAL LOW (ref 13.0–17.0)
Immature Granulocytes: 1 %
Lymphocytes Relative: 18 %
Lymphs Abs: 0.9 10*3/uL (ref 0.7–4.0)
MCH: 26.7 pg (ref 26.0–34.0)
MCHC: 30.2 g/dL (ref 30.0–36.0)
MCV: 88.4 fL (ref 80.0–100.0)
Monocytes Absolute: 0.3 10*3/uL (ref 0.1–1.0)
Monocytes Relative: 7 %
Neutro Abs: 3.8 10*3/uL (ref 1.7–7.7)
Neutrophils Relative %: 73 %
Platelets: 67 10*3/uL — ABNORMAL LOW (ref 150–400)
RBC: 3.63 MIL/uL — ABNORMAL LOW (ref 4.22–5.81)
RDW: 21.5 % — ABNORMAL HIGH (ref 11.5–15.5)
Smear Review: NORMAL
WBC: 5.2 10*3/uL (ref 4.0–10.5)
nRBC: 0.6 % — ABNORMAL HIGH (ref 0.0–0.2)

## 2023-07-21 LAB — URINALYSIS, W/ REFLEX TO CULTURE (INFECTION SUSPECTED)
Bilirubin Urine: NEGATIVE
Glucose, UA: NEGATIVE mg/dL
Hgb urine dipstick: NEGATIVE
Ketones, ur: 5 mg/dL — AB
Leukocytes,Ua: NEGATIVE
Nitrite: NEGATIVE
Protein, ur: 30 mg/dL — AB
Specific Gravity, Urine: 1.02 (ref 1.005–1.030)
pH: 5 (ref 5.0–8.0)

## 2023-07-21 LAB — BLOOD GAS, VENOUS
Acid-Base Excess: 2.2 mmol/L — ABNORMAL HIGH (ref 0.0–2.0)
Bicarbonate: 25.3 mmol/L (ref 20.0–28.0)
O2 Saturation: 79.5 %
Patient temperature: 37
pCO2, Ven: 34 mmHg — ABNORMAL LOW (ref 44–60)
pH, Ven: 7.48 — ABNORMAL HIGH (ref 7.25–7.43)
pO2, Ven: 49 mmHg — ABNORMAL HIGH (ref 32–45)

## 2023-07-21 LAB — RESP PANEL BY RT-PCR (RSV, FLU A&B, COVID)  RVPGX2
Influenza A by PCR: NEGATIVE
Influenza B by PCR: NEGATIVE
Resp Syncytial Virus by PCR: NEGATIVE
SARS Coronavirus 2 by RT PCR: NEGATIVE

## 2023-07-21 LAB — TECHNOLOGIST SMEAR REVIEW: Plt Morphology: NORMAL

## 2023-07-21 LAB — LACTATE DEHYDROGENASE: LDH: 1627 U/L — ABNORMAL HIGH (ref 98–192)

## 2023-07-21 LAB — PROTIME-INR
INR: 1.5 — ABNORMAL HIGH (ref 0.8–1.2)
Prothrombin Time: 18.8 s — ABNORMAL HIGH (ref 11.4–15.2)

## 2023-07-21 LAB — LACTIC ACID, PLASMA
Lactic Acid, Venous: 2.5 mmol/L (ref 0.5–1.9)
Lactic Acid, Venous: 2.8 mmol/L (ref 0.5–1.9)
Lactic Acid, Venous: 2.5 mmol/L (ref 0.5–1.9)

## 2023-07-21 LAB — MAGNESIUM: Magnesium: 2.5 mg/dL — ABNORMAL HIGH (ref 1.7–2.4)

## 2023-07-21 MED ORDER — ATORVASTATIN CALCIUM 20 MG PO TABS: 40.0000 mg | ORAL_TABLET | Freq: Every day | ORAL | Status: DC

## 2023-07-21 MED ORDER — NAPHAZOLINE-GLYCERIN 0.012-0.25 % OP SOLN
1.0000 [drp] | Freq: Four times a day (QID) | OPHTHALMIC | Status: DC | PRN
Start: 2023-07-21 — End: 2023-07-27

## 2023-07-21 MED ORDER — GADOBUTROL 1 MMOL/ML IV SOLN
7.5000 mL | Freq: Once | INTRAVENOUS | Status: AC | PRN
  Administered 2023-07-21: 7.5 mL via INTRAVENOUS

## 2023-07-21 MED ORDER — SODIUM CHLORIDE 0.9 % IV SOLN
2.0000 g | INTRAVENOUS | Status: DC
  Administered 2023-07-21: 2 g via INTRAVENOUS
  Filled 2023-07-21 (×4): qty 2000

## 2023-07-21 MED ORDER — IOHEXOL 350 MG/ML SOLN
100.0000 mL | Freq: Once | INTRAVENOUS | Status: AC | PRN
  Administered 2023-07-21: 100 mL via INTRAVENOUS

## 2023-07-21 MED ORDER — ENALAPRILAT 1.25 MG/ML IV SOLN: 0.6250 mg | INTRAVENOUS | Status: DC | PRN

## 2023-07-21 MED ORDER — ACETAMINOPHEN 10 MG/ML IV SOLN
1000.0000 mg | Freq: Once | INTRAVENOUS | Status: AC
  Administered 2023-07-21: 1000 mg via INTRAVENOUS
  Filled 2023-07-21 (×2): qty 100

## 2023-07-21 MED ORDER — LACTATED RINGERS IV SOLN: INTRAVENOUS | Status: DC

## 2023-07-21 MED ORDER — SODIUM CHLORIDE 0.9 % IV SOLN: INTRAVENOUS | Status: DC

## 2023-07-21 MED ORDER — SODIUM CHLORIDE 0.9 % IV SOLN
2.0000 g | Freq: Once | INTRAVENOUS | Status: AC
  Administered 2023-07-21: 2 g via INTRAVENOUS
  Filled 2023-07-21: qty 12.5

## 2023-07-21 MED ORDER — LACTATED RINGERS IV BOLUS (SEPSIS)
1000.0000 mL | Freq: Once | INTRAVENOUS | Status: AC
  Administered 2023-07-21: 1000 mL via INTRAVENOUS

## 2023-07-21 MED ORDER — SODIUM CHLORIDE 0.9 % IV SOLN
2.0000 g | Freq: Three times a day (TID) | INTRAVENOUS | Status: DC
  Administered 2023-07-21: 2 g via INTRAVENOUS
  Filled 2023-07-21: qty 12.5

## 2023-07-21 MED ORDER — LORAZEPAM 2 MG/ML IJ SOLN
2.0000 mg | INTRAMUSCULAR | Status: DC | PRN
Start: 2023-07-21 — End: 2023-07-21

## 2023-07-21 MED ORDER — DEXTROSE 5 % IV SOLN
10.0000 mg/kg | Freq: Three times a day (TID) | INTRAVENOUS | Status: DC
  Administered 2023-07-21: 915 mg via INTRAVENOUS
  Filled 2023-07-21 (×2): qty 18.3

## 2023-07-21 MED ORDER — LEVETIRACETAM (KEPPRA) 500 MG/5 ML ADULT IV PUSH
2000.0000 mg | Freq: Once | INTRAVENOUS | Status: AC
  Administered 2023-07-21: 2000 mg via INTRAVENOUS
  Filled 2023-07-21: qty 20

## 2023-07-21 MED ORDER — METRONIDAZOLE 500 MG/100ML IV SOLN
500.0000 mg | Freq: Once | INTRAVENOUS | Status: AC
  Administered 2023-07-21: 500 mg via INTRAVENOUS
  Filled 2023-07-21: qty 100

## 2023-07-21 MED ORDER — STROKE: EARLY STAGES OF RECOVERY BOOK: Freq: Once | Status: DC

## 2023-07-21 MED ORDER — ONDANSETRON HCL 4 MG/2ML IJ SOLN: 4.0000 mg | Freq: Three times a day (TID) | INTRAMUSCULAR | Status: DC | PRN

## 2023-07-21 MED ORDER — ASPIRIN 300 MG RE SUPP: 300.0000 mg | Freq: Every day | RECTAL | Status: DC

## 2023-07-21 MED ORDER — VANCOMYCIN HCL IN DEXTROSE 1-5 GM/200ML-% IV SOLN
1000.0000 mg | Freq: Once | INTRAVENOUS | Status: AC
  Administered 2023-07-21: 1000 mg via INTRAVENOUS
  Filled 2023-07-21: qty 200

## 2023-07-21 MED ORDER — LACTATED RINGERS IV BOLUS
1000.0000 mL | Freq: Once | INTRAVENOUS | Status: AC
  Administered 2023-07-21: 1000 mL via INTRAVENOUS

## 2023-07-21 MED ORDER — LEVETIRACETAM (KEPPRA) 500 MG/5 ML ADULT IV PUSH
500.0000 mg | Freq: Two times a day (BID) | INTRAVENOUS | Status: DC
  Administered 2023-07-23 – 2023-07-27 (×9): 500 mg via INTRAVENOUS
  Filled 2023-07-21 (×11): qty 5

## 2023-07-21 MED ORDER — MORPHINE 100MG IN NS 100ML (1MG/ML) PREMIX INFUSION
1.0000 mg/h | INTRAVENOUS | Status: DC
  Administered 2023-07-22 – 2023-07-27 (×3): 1 mg/h via INTRAVENOUS
  Filled 2023-07-21 (×2): qty 100

## 2023-07-21 MED ORDER — LORAZEPAM 2 MG/ML IJ SOLN: 2.0000 mg | INTRAMUSCULAR | Status: DC | PRN

## 2023-07-21 MED ORDER — SENNOSIDES-DOCUSATE SODIUM 8.6-50 MG PO TABS: 1.0000 | ORAL_TABLET | Freq: Every evening | ORAL | Status: DC | PRN

## 2023-07-21 MED ORDER — DM-GUAIFENESIN ER 30-600 MG PO TB12: 1.0000 | ORAL_TABLET | Freq: Two times a day (BID) | ORAL | Status: DC | PRN

## 2023-07-21 MED ORDER — SCOPOLAMINE 1 MG/3DAYS TD PT72
1.0000 | MEDICATED_PATCH | TRANSDERMAL | Status: DC
  Administered 2023-07-21 – 2023-07-24 (×2): 1.5 mg via TRANSDERMAL
  Filled 2023-07-21 (×2): qty 1

## 2023-07-21 MED ORDER — IBUPROFEN 400 MG PO TABS: 200.0000 mg | ORAL_TABLET | Freq: Four times a day (QID) | ORAL | Status: DC | PRN

## 2023-07-21 MED ORDER — ALBUTEROL SULFATE (2.5 MG/3ML) 0.083% IN NEBU: 2.5000 mg | INHALATION_SOLUTION | RESPIRATORY_TRACT | Status: DC | PRN

## 2023-07-21 NOTE — H&P (Signed)
 History and Physical    Trevor Flores ZOX:[id number] [name]: [birth year] DOA: 07/21/2023  Referring MD/NP/PA:   PCP: Antonio Baumgarten, MD   Patient coming from:  The patient is coming from home.     Chief Complaint: AMS  HPI: Trevor Flores is a 63 y.o. 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  Per his wife at the bedside, pt has been confused in the past 5 days, which has been progressively worsening.  Patient has not been able to eat.  At baseline he is dependent on wife and son for most of his ADLs but is able to ambulate some with a walker. When I saw pt in ED, he is in comatose status, not arousable, not following command, not moving extremities even on painful stimuli. Per his wife, patient has some dry cough, no respiratory distress or SOB.  Patient is constipated, no nausea, vomiting, diarrhea or abdominal pain.  Does not seem to have chest pain at home. Pt was noted to have fever of 101.3 in ED.  Data reviewed independently and ED Course: pt was found to have WBC 5.2, GFR> 60, INR 1.5, lactic acid 2.5 --> 2.8--> 2.5, negative UA except for rare bacteria, negative PCR for COVID, flu and RSV.  Temperature 101.3, blood pressure 104/68, heart rate 96, RR 24, oxygen saturation 100% on room air.  Chest x-ray negative.  EEG showed diffuse slowing and focal slowing over the left hemisphere.  CT of head showed possible left parietal stroke. MRI of brain showed large acute L parietal ischemic infarct and numerous smaller acute embolic infarcts in multiple vascular distributions. Patient is admitted to MedSurg bed for comfort care measures. Dr. Doretta Gant of neuro is consulted.     MRI-brain: Multiple scattered areas of acute infarct as described above most pronounced within the left MCA territory. Given bilateral cerebral and cerebellar involvement is concern for embolic etiology.   Small focus of enhancement in the posterior left frontal lobe involving the precentral gyrus which could reflect enhancement within a region of subacute infarct. However, given reported history of colon cancer recommend repeat MRI with without contrast in 3 months to document resolution.     CT-head 1. Findings consistent with a large, acute infarct involving the left parietal lobe. No intracranial hemorrhage visualized. 2. Small region of cortical hypoattenuation in the posteromedial right occipital lobe (axial 16), also worrisome for an acute, but focal occipital lobe infarct.   CTA  1. Pulmonary emboli, slightly decreased in volume since 06/29/2023. No evidence of right heart strain. 2. Persistent right lower lobe subpleural pulmonary opacity, favoring evolving infarct. 3. No evidence of metastatic disease in the chest. 4. Pulmonary artery enlargement suggests pulmonary arterial hypertension. 5. Incidental findings, including: Coronary artery atherosclerosis. Aortic Atherosclerosis (ICD10-I70.0).   CT ABDOMEN AND PELVIS IMPRESSION  1. Proximal sigmoid primary and regional nodal metastasis, minimally improved. 2. Large volume hepatic metastasis. Difficult to directly compare to the 06/12/2023 exam, which was extremely artifact degraded. Favored to be minimally progressive. 3. No bowel obstruction or other acute complication. 4. New small volume pelvic fluid. 5. Artifact degradation, especially involving the spleen. Suspect small volume splenic infarcts.       EKG: I have personally reviewed.  Sinus rhythm, QTc 487,  LAE, low voltage, poor R wave progression, Q wave in lead III/aVF.   Review of Systems: Could not be reviewed due to comatose  status.    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 12/2011 Echo: EF 60%, no rwma.   Hemiparesis affecting right side as late effect of cerebrovascular accident (CVA) (HCC)    History of stress test    a. 04/2004 Ex MV: Ex time 10:30. Hypertensive response. No ischemia/infarct.   Hyperlipidemia    Hypertension    Long-term use of aspirin  therapy    Palpitations    Parkinson disease (HCC)    Personal history of gout    Possible Lacunar infarction (HCC)    a. 12/2011 Head CT: CSF density lateral to R cerebral peduncle - ? prior lacunar infarct.   Pre-syncope    a. 06/2014 Monitor: Sinus rhythm. No significant arrhythmia.   Resting tremor    Stroke [facility])    Vascular parkinsonism (HCC)    Vertigo    Weakness of both lower extremities     Past Surgical History:  Procedure Laterality Date   APPENDECTOMY     COLONOSCOPY WITH PROPOFOL  N/A 02/04/2023   Procedure: COLONOSCOPY WITH PROPOFOL ;  Surgeon: Toledo, Alphonsus Jeans, MD;  Location: [facility] ENDOSCOPY;  Service: Gastroenterology;  Laterality: N/A;   COLONOSCOPY WITH PROPOFOL  N/A 06/10/2023   Procedure: COLONOSCOPY WITH PROPOFOL ;  Surgeon: Toledo, Alphonsus Jeans, MD;  Location: [facility] ENDOSCOPY;  Service: Gastroenterology;  Laterality: N/A;   ESOPHAGOGASTRODUODENOSCOPY (EGD) WITH PROPOFOL  N/A 02/04/2023   Procedure: ESOPHAGOGASTRODUODENOSCOPY (EGD) WITH PROPOFOL ;  Surgeon: Toledo, Alphonsus Jeans, MD;  Location: [facility] ENDOSCOPY;   Service: Gastroenterology;  Laterality: N/A;   ESOPHAGOGASTRODUODENOSCOPY (EGD) WITH PROPOFOL  N/A 06/10/2023   Procedure: ESOPHAGOGASTRODUODENOSCOPY (EGD) WITH PROPOFOL ;  Surgeon: Toledo, Alphonsus Jeans, MD;  Location: [facility] ENDOSCOPY;  Service: Gastroenterology;  Laterality: N/A;   HEMORRHOID BANDING  ?   Dr Bradford Cadet   IVC FILTER INSERTION N/A 07/02/2023   Procedure: IVC FILTER INSERTION;  Surgeon: Celso College, MD;  Location: [facility] INVASIVE CV LAB;  Service: Cardiovascular;  Laterality: N/A;   POLYPECTOMY  06/10/2023   Procedure: POLYPECTOMY, INTESTINE;  Surgeon: Toledo, Alphonsus Jeans, MD;  Location: [facility] ENDOSCOPY;  Service: Gastroenterology;;   PORTACATH PLACEMENT N/A 06/26/2023   Procedure: INSERTION, TUNNELED CENTRAL VENOUS DEVICE, WITH PORT;  Surgeon: [name], MD;  Location: [facility] ORS;  Service: General;  Laterality: N/A;   PULMONARY THROMBECTOMY Bilateral 07/02/2023   Procedure: PULMONARY THROMBECTOMY;  Surgeon: Celso College, MD;  Location: [facility] INVASIVE CV LAB;  Service: Cardiovascular;  Laterality: Bilateral;    Social History:  reports that he has never smoked. He has quit using smokeless tobacco.  His smokeless tobacco use included chew. He reports that he does not drink alcohol and does not use drugs.  Family History:  Family History  Problem Relation Age of Onset   Diabetes  Brother    Colon cancer Neg Hx      Prior to Admission medications   Medication Sig Start Date End Date Taking? Authorizing Provider  albuterol  (VENTOLIN  HFA) 108 (90 Base) MCG/ACT inhaler Inhale 1-2 puffs into the lungs every 4 (four) hours as needed. 03/03/23   [provider]  allopurinol (ZYLOPRIM) 100 MG tablet Take 100 mg by mouth daily. 12/28/18   [provider]  apixaban  (ELIQUIS ) 5 MG TABS tablet Take 2 tablets (10 mg total) by mouth 2 (two) times daily for 6 days, THEN 1 tablet (5 mg total) 2 (two) times daily. 07/04/23 08/09/23  Donaciano Frizzle, MD  carbidopa -levodopa  (SINEMET  IR)  25-100 MG tablet Take 1 tablet by mouth 3 (three) times daily.    [provider]  diltiazem  (TIAZAC ) 360 MG 24 hr capsule Take 1 capsule (360 mg total) by mouth daily. 03/19/23   Furth, Cadence H, PA-C  Docusate Sodium  (DSS) 100 MG CAPS Take 1 capsule by mouth 2 (two) times daily. 04/10/23   [provider]  doxazosin  (CARDURA ) 1 MG tablet TAKE 1 TABLET BY MOUTH DAILY AS NEEDED FOR PRESSURE GREATER THAN 150 12/10/21   Gollan, Timothy J, MD  fluticasone (FLONASE) 50 MCG/ACT nasal spray Place into both nostrils daily as needed for allergies or rhinitis.    [provider]  loratadine (CLARITIN) 10 MG tablet Take 10 mg by mouth daily as needed for allergies.    [provider]  losartan  (COZAAR ) 100 MG tablet TAKE 1 TABLET(100 MG) BY MOUTH DAILY IN THE MORNING 04/14/23   Gollan, Timothy J, MD  meclizine  (ANTIVERT ) 12.5 MG tablet Take 1 tablet (12.5 mg total) by mouth 3 (three) times daily as needed for dizziness. 07/08/23   Collis Deaner, MD  metoprolol  succinate (TOPROL -XL) 25 MG 24 hr tablet TAKE 1 TABLET(25 MG) BY MOUTH EVERY EVENING WITH FOOD Patient taking differently: Take 12.5 mg by mouth daily. 04/14/23   Gollan, Timothy J, MD  ondansetron  (ZOFRAN ) 8 MG tablet Take 1 tablet (8 mg total) by mouth every 8 (eight) hours as needed for nausea or vomiting. Start on the third day after chemotherapy. Patient not taking: Reported on 06/30/2023 06/25/23   [name], MD  oxyCODONE  (OXY IR/ROXICODONE ) 5 MG immediate release tablet Take 1 tablet (5 mg total) by mouth every 6 (six) hours as needed for severe pain (pain score 7-10) or moderate pain (pain score 4-6). Patient taking differently: Take 2.5 mg by mouth every 6 (six) hours as needed for severe pain (pain score 7-10) or moderate pain (pain score 4-6). 06/27/23   [name], MD  pantoprazole  (PROTONIX ) 40 MG tablet Take 1 tablet (40 mg total) by mouth daily. 01/26/22 07/14/23  Stafford Eagles, PA-C  polyethylene glycol powder  (GLYCOLAX /MIRALAX ) 17 GM/SCOOP powder Take 17 g by mouth daily as needed.    [provider]  prochlorperazine  (COMPAZINE ) 10 MG tablet Take 1 tablet (10 mg total) by mouth every 6 (six) hours as needed for nausea or vomiting. 07/14/23   [name], MD  rosuvastatin  (CRESTOR ) 5 MG tablet Take 5 mg by mouth daily. 01/07/19   [provider]  senna-docusate (SENOKOT-S) 8.6-50 MG tablet Take 2 tablets by mouth daily. 06/17/23   [name], MD  sildenafil (VIAGRA) 25 MG tablet Take 25 mg by mouth as needed.    [provider]  SPIRIVA HANDIHALER 18 MCG inhalation capsule Place 18 mcg into inhaler and inhale daily.    [provider]  Vitamin D, Ergocalciferol, (DRISDOL) 1.25 MG (50000 UNIT) CAPS capsule Take 50,000 Units by mouth once a week. 01/28/23   [provider]    Physical Exam: Vitals:   07/21/23 2300 07/21/23 2324 07/21/23 2330 07/22/23 0010  BP: 117/71  122/77   Pulse: 83  84   Resp: 20  20   Temp:  98.1 F (36.7 C)    TempSrc:  Oral    SpO2: 100%  98% 98%  Weight:       General: Dry mucous membrane HEENT:       Eyes: PERRL, EOMI, no jaundice       ENT: No discharge from the ears and nose       Neck: No JVD, no bruit, no mass felt. Heme: No neck lymph node enlargement. Cardiac: S1/S2, RRR, No murmurs, No gallops or rubs. Respiratory: No rales, wheezing, rhonchi or rubs. GI: Soft, nondistended, nontender, no organomegaly, BS present. GU: No hematuria Ext: No pitting leg edema bilaterally. 1+DP/PT pulse bilaterally. Musculoskeletal: No joint deformities, No joint redness or warmth, no limitation of ROM in spin. Skin: No rashes.  Neuro: Patient is comatose status, not arousable, not following command, not moving extremities even on painful stimuli, sluggish pupil reaction. Psych: Patient is not psychotic  Labs on Admission: I have personally reviewed following labs and imaging studies  CBC: Recent Labs  Lab 07/21/23 1108  WBC 5.2   NEUTROABS 3.8  HGB 9.7*  HCT 32.1*  MCV 88.4  PLT 67*   Basic Metabolic Panel: Recent Labs  Lab 07/21/23 1108  NA 143  K 4.0  CL 106  CO2 21*  GLUCOSE 82  BUN 31*  CREATININE 1.26*  CALCIUM  8.5*  MG 2.5*   GFR: Estimated Creatinine Clearance: 66.7 mL/min (A) (by C-G formula based on SCr of 1.26 mg/dL (H)). Liver Function Tests: Recent Labs  Lab 07/21/23 1108  AST 176*  ALT 44  ALKPHOS 216*  BILITOT 1.6*  PROT 7.6  ALBUMIN 3.0*   No results for input(s): "LIPASE", "AMYLASE" in the last 168 hours. No results for input(s): "AMMONIA" in the last 168 hours. Coagulation Profile: Recent Labs  Lab 07/21/23 1108  INR 1.5*   Cardiac Enzymes: No results for input(s): "CKTOTAL", "CKMB", "CKMBINDEX", "TROPONINI" in the last 168 hours. BNP (last 3 results) No results for input(s): "PROBNP" in the last 8760 hours. HbA1C: No results for input(s): "HGBA1C" in the last 72 hours. CBG: Recent Labs  Lab 07/21/23 1101  GLUCAP 76   Lipid Profile: No results for input(s): "CHOL", "HDL", "LDLCALC", "TRIG", "CHOLHDL", "LDLDIRECT" in the last 72 hours. Thyroid Function Tests: No results for input(s): "TSH", "T4TOTAL", "FREET4", "T3FREE", "THYROIDAB" in the last 72 hours. Anemia Panel: No results for input(s): "VITAMINB12", "FOLATE", "FERRITIN", "TIBC", "IRON ", "RETICCTPCT" in the last 72 hours. Urine analysis:    Component Value Date/Time   COLORURINE YELLOW (A) 07/21/2023 1108   APPEARANCEUR HAZY (A) 07/21/2023 1108   APPEARANCEUR Clear 12/05/2011 0955   LABSPEC 1.020 07/21/2023 1108   LABSPEC 1.003 12/05/2011 0955   PHURINE 5.0 07/21/2023 1108   GLUCOSEU NEGATIVE 07/21/2023 1108   GLUCOSEU Negative 12/05/2011 0955   HGBUR NEGATIVE 07/21/2023 1108   BILIRUBINUR NEGATIVE 07/21/2023 1108   BILIRUBINUR Negative 12/05/2011 0955   KETONESUR 5 (A) 07/21/2023 1108   PROTEINUR 30 (A) 07/21/2023 1108   NITRITE NEGATIVE 07/21/2023 1108   LEUKOCYTESUR NEGATIVE 07/21/2023 1108    LEUKOCYTESUR Negative 12/05/2011 0955   Sepsis Labs: @LABRCNTIP (procalcitonin:4,lacticidven:4) ) Recent Results (from the past  240 hours)  Resp panel by RT-PCR (RSV, Flu A&B, Covid) Anterior Nasal Swab     Status: None   Collection Time: 07/21/23 11:08 AM   Specimen: Anterior Nasal Swab  Result Value Ref Range Status   SARS Coronavirus 2 by RT PCR NEGATIVE NEGATIVE Final    Comment: (NOTE) SARS-CoV-2 target nucleic acids are NOT DETECTED.  The SARS-CoV-2 RNA is generally detectable in upper respiratory specimens during the acute phase of infection. The lowest concentration of SARS-CoV-2 viral copies this assay can detect is 138 copies/mL. A negative result does not preclude SARS-Cov-2 infection and should not be used as the sole basis for treatment or other patient management decisions. A negative result may occur with  improper specimen collection/handling, submission of specimen other than nasopharyngeal swab, presence of viral mutation(s) within the areas targeted by this assay, and inadequate number of viral copies(<138 copies/mL). A negative result must be combined with clinical observations, patient history, and epidemiological information. The expected result is Negative.  Fact Sheet for Patients:  BloggerCourse.com  Fact Sheet for Healthcare Providers:  SeriousBroker.it  This test is no t yet approved or cleared by the [place]  FDA and  has been authorized for detection and/or diagnosis of SARS-CoV-2 by FDA under an Emergency Use Authorization (EUA). This EUA will remain  in effect (meaning this test can be used) for the duration of the COVID-19 declaration under Section 564(b)(1) of the Act, 21 U.S.C.section 360bbb-3(b)(1), unless the authorization is terminated  or revoked sooner.       Influenza A by PCR NEGATIVE NEGATIVE Final   Influenza B by PCR NEGATIVE NEGATIVE Final    Comment: (NOTE) The Xpert  Xpress SARS-CoV-2/FLU/RSV plus assay is intended as an aid in the diagnosis of influenza from Nasopharyngeal swab specimens and should not be used as a sole basis for treatment. Nasal washings and aspirates are unacceptable for Xpert Xpress SARS-CoV-2/FLU/RSV testing.  Fact Sheet for Patients: BloggerCourse.com  Fact Sheet for Healthcare Providers: SeriousBroker.it  This test is not yet approved or cleared by the [place]  FDA and has been authorized for detection and/or diagnosis of SARS-CoV-2 by FDA under an Emergency Use Authorization (EUA). This EUA will remain in effect (meaning this test can be used) for the duration of the COVID-19 declaration under Section 564(b)(1) of the Act, 21 U.S.C. section 360bbb-3(b)(1), unless the authorization is terminated or revoked.     Resp Syncytial Virus by PCR NEGATIVE NEGATIVE Final    Comment: (NOTE) Fact Sheet for Patients: BloggerCourse.com  Fact Sheet for Healthcare Providers: SeriousBroker.it  This test is not yet approved or cleared by the [place]  FDA and has been authorized for detection and/or diagnosis of SARS-CoV-2 by FDA under an Emergency Use Authorization (EUA). This EUA will remain in effect (meaning this test can be used) for the duration of the COVID-19 declaration under Section 564(b)(1) of the Act, 21 U.S.C. section 360bbb-3(b)(1), unless the authorization is terminated or revoked.  Performed at [facility], [address], [place], [place] [address]      Radiological Exams on Admission:   Assessment/Plan Principal Problem:   Comfort measures only status Active Problems:   Stroke (HCC)   Coma (HCC)   SIRS (systemic inflammatory response syndrome) (HCC)   Adenocarcinoma of colon (HCC)   Chronic deep vein thrombosis (DVT) of femoral vein of both lower extremities (HCC)   S/P insertion of  IVC (inferior vena caval) filter   Thrombocytopenia (HCC)   Bilateral pulmonary embolism (HCC)  Abnormal LFTs   Essential hypertension   CAD (coronary artery disease)   Chronic diastolic CHF (congestive heart failure) (HCC)   Gout   Stage 3a chronic kidney disease (HCC)   IDA (iron  deficiency anemia)   Overweight (BMI 25.0-29.9)   Assessment and Plan:  Comfort measures only status: Patient has multiple complicated chronic comorbidities, including stage IV metastasized to colon cancer.  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  Could not do LP due to current use of Eliquis .  Patient received 1 dose of acyclovir , vancomycin , ampicillin , cefepime  and Flagyl  due to concerning for meningitis.  Also received loading dose of 2 g of Keppra  for possible seizure.  Patient remains comatose status. Neurology was consulted. His prognosis is extremely poor. I have had extensive discussion with his wife, who is very supportive and clearly understands patient's extremely poor prognosis. She agreed for comfort care now, which is very reasonable. Pt will be DNR.  -Will admit to med-surg bed for comfort care. -Stop drawing labs and IVF -prn LORazepam  for agitation - IV morphine  infusion for pain -Naphazoline 0.1 % ophthalmic solution -scopolamine  patch -Psycho/Social: emotional support offered to his wife at bedside -consult to palliterative care team -Nurse may pronounce death -ordered comfort measure   Other active medical issues are listed as below, will focus on comfort care only now    Stroke [facility])   Coma   SIRS (systemic inflammatory response syndrome) (HCC)   Adenocarcinoma of colon (HCC)   Chronic deep vein thrombosis (DVT) of femoral vein of both lower extremities (HCC)   S/P insertion of IVC (inferior vena caval) filter    Thrombocytopenia (HCC)   Bilateral pulmonary embolism (HCC)   Abnormal LFTs   Essential hypertension   CAD (coronary artery disease)   Chronic diastolic CHF (congestive heart failure) (HCC)   Gout   Stage 3a chronic kidney disease (HCC)   IDA (iron  deficiency anemia)   Overweight (BMI 25.0-29.9)         CRITICAL CARE Performed by: Christin Moline   Total critical care time: 70 min minutes  Critical care time was exclusive of separately billable procedures and treating other patients.  Critical care was necessary to treat or prevent imminent or life-threatening deterioration.  Critical care was time spent personally by me on the following activities: development of treatment plan with patient and/or surrogate as well as nursing, discussions with consultants, evaluation of patient's response to treatment, examination of patient, obtaining history from patient or surrogate, ordering and performing treatments and interventions, ordering and review of laboratory studies, ordering and review of radiographic studies, pulse oximetry and re-evaluation of patient's condition.   DVT ppx: none  Code Status: DNR   Family Communication:  Yes, patient's wife  at bed side.     Disposition Plan:  Anticipate discharge back to previous environment  Consults called:  Dr. Doretta Gant of neuro  Admission status and Level of care: Med-Surg:    for obs as inpt        Dispo: The patient is from: Home              Anticipated d/c is to: to be determined              Anticipated d/c date is: 2 days              Patient currently is not medically stable  to d/c.    Severity of Illness:  The appropriate patient status for this patient is INPATIENT. Inpatient status is judged to be reasonable and necessary in order to provide the required intensity of service to ensure the patient's safety. The patient's presenting symptoms, physical exam findings, and initial radiographic and laboratory data in the  context of their chronic comorbidities is felt to place them at high risk for further clinical deterioration. Furthermore, it is not anticipated that the patient will be medically stable for discharge from the hospital within 2 midnights of admission.   * I certify that at the point of admission it is my clinical judgment that the patient will require inpatient hospital care spanning beyond 2 midnights from the point of admission due to high intensity of service, high risk for further deterioration and high frequency of surveillance required.*       Date of Service 07/22/2023    Fidencio Hue [facility]   If 7PM-7AM, please contact night-coverage [email or web address] 07/22/2023, 1:06 AM

## 2023-07-21 NOTE — ED Provider Notes (Signed)
 [facility] Provider Note    Event Date/Time   First MD Initiated Contact with Patient 07/21/23 1103     (approximate)   History   Altered Mental Status   HPI  Trevor Flores is a 63 y.o. male with history of stage IV colon cancer currently on chemotherapy who comes in with concerns for altered mental status.  He according to patient's son he has been like this since Saturday or Sunday that he has been nonverbal but will moan with pain.  They report concerns for some decreased p.o. intake and they are have to give him fluids in a syringe.     Physical Exam   Triage Vital Signs: ED Triage Vitals  Encounter Vitals Group     BP 07/21/23 1101 127/80     Systolic BP Percentile --      Diastolic BP Percentile --      Pulse Rate 07/21/23 1058 96     Resp 07/21/23 1058 (!) 24     Temp 07/21/23 1106 (!) 101.3 F (38.5 C)     Temp Source 07/21/23 1106 Rectal     SpO2 07/21/23 1058 100 %     Weight 07/21/23 1059 202 lb (91.6 kg)     Height --      Head Circumference --      Peak Flow --      Pain Score --      Pain Loc --      Pain Education --      Exclude from Growth Chart --     Most recent vital signs: Vitals:   07/21/23 1106 07/21/23 1115  BP:    Pulse:  94  Resp:  (!) 23  Temp: (!) 101.3 F (38.5 C)   SpO2:  100%     General: Patient is altered responds to some minimal pain. CV:  Good peripheral perfusion.  Resp:  Normal effort.  Abd:  No distention.  Soft and nontender Other:  Port noted in right chest wall.  No swelling in the legs   ED Results / Procedures / Treatments   Labs (all labs ordered are listed, but only abnormal results are displayed) Labs Reviewed  CULTURE, BLOOD (ROUTINE X 2)  CULTURE, BLOOD (ROUTINE X 2)  RESP PANEL BY RT-PCR (RSV, FLU A&B, COVID)  [id number]  COMPREHENSIVE METABOLIC PANEL WITH GFR  LACTIC ACID, PLASMA  LACTIC ACID, PLASMA  CBC WITH DIFFERENTIAL/PLATELET  PROTIME-INR  URINALYSIS, W/ REFLEX  TO CULTURE (INFECTION SUSPECTED)  BLOOD GAS, VENOUS  MAGNESIUM  CBG MONITORING, ED     EKG  My interpretation of EKG:  Normal sinus rhythm 96 ST elevation or T wave inversions, normal intervals  RADIOLOGY I have reviewed the cft head  personally and interpreted no evidence of intracranial hemorrhage   PROCEDURES:  Critical Care performed: Yes, see critical care procedure note(s)  .1-3 Lead EKG Interpretation  Performed by: Lubertha Rush, MD Authorized by: Lubertha Rush, MD     Interpretation: normal     ECG rate:  80   ECG rate assessment: normal     Rhythm: sinus rhythm     Ectopy: none     Conduction: normal   .Critical Care  Performed by: Lubertha Rush, MD Authorized by: Lubertha Rush, MD   Critical care provider statement:    Critical care time (minutes):  30   Critical care was necessary to treat or prevent imminent or life-threatening deterioration of the  following conditions:  Sepsis   Critical care was time spent personally by me on the following activities:  Development of treatment plan with patient or surrogate, discussions with consultants, evaluation of patient's response to treatment, examination of patient, ordering and review of laboratory studies, ordering and review of radiographic studies, ordering and performing treatments and interventions, pulse oximetry, re-evaluation of patient's condition and review of old charts    MEDICATIONS ORDERED IN ED: Medications  vancomycin  (VANCOCIN ) IVPB 1000 mg/200 mL premix (1,000 mg Intravenous New Bag/Given 07/21/23 1317)  lactated ringers  bolus 1,000 mL (0 mLs Intravenous Stopped 07/21/23 1217)  lactated ringers  bolus 1,000 mL (0 mLs Intravenous Stopped 07/21/23 1310)    And  lactated ringers  bolus 1,000 mL (1,000 mLs Intravenous New Bag/Given 07/21/23 1317)  ceFEPIme  (MAXIPIME ) 2 g in sodium chloride  0.9 % 100 mL IVPB (0 g Intravenous Stopped 07/21/23 1214)  metroNIDAZOLE  (FLAGYL ) IVPB 500 mg (0 mg Intravenous  Stopped 07/21/23 1313)  acetaminophen  (OFIRMEV ) IV 1,000 mg (0 mg Intravenous Stopped 07/21/23 1215)  iohexol  (OMNIPAQUE ) 350 MG/ML injection 100 mL (100 mLs Intravenous Contrast Given 07/21/23 1329)     IMPRESSION / MDM / ASSESSMENT AND PLAN / ED COURSE  I reviewed the triage vital signs and the nursing notes.   Patient's presentation is most consistent with acute presentation with potential threat to life or bodily function.   Patient received chemotherapy meet sepsis with elevated fever elevated heart rate therefore sepsis alert called broad-spectrum antibiotics were started and CT pan scan ordered given patient's altered mental status.  CT imaging was negative other than his known PEs, colon cancer with metastatic disease however CT head was concerning for new stroke still waiting on radiology to read it.  We went to go reevaluate patient he was slightly improved with mentation where now with sternal rub he did squeeze his left hand and he did occasionally moan.  He did also withdrawal more with nasal stimulation.  However I did notice that his eyes were more deviated to the right.  I have discussed the case with neurology Dr. Doretta Gant who came down and evaluated patient.  Discussed concerning for a stroke but he was out of the window for any interventions given his last known normal was a few days ago.  We are unable to do LP secondary to patient's being on Eliquis  but we will cover broadly with meningitis medications.  I discussed the case with neurology who will want to get an MRI, EEG to decide on goals of care, neck steps whether or not patient will need to be transferred.  Labs do show elevated lactate that was rising on repeat but patient's gotten full fluid resuscitation his heart rates have come down blood pressures remained stable looks well-hydrated on examination.  VBG without evidence of hypercapnia.  Creatinine is elevated with concerns for dehydration hemoglobin is stable  Patient  handed off to Dr Alejo Amsler-  The patient is on the cardiac monitor to evaluate for evidence of arrhythmia and/or significant heart rate changes.      FINAL CLINICAL IMPRESSION(S) / ED DIAGNOSES   Final diagnoses:  Altered mental status, unspecified altered mental status type  Cerebrovascular accident (CVA), unspecified mechanism (HCC)     Rx / DC Orders   ED Discharge Orders     None        Note:  This document was prepared using Dragon voice recognition software and may include unintentional dictation errors.   Lubertha Rush, MD 07/21/23 1524

## 2023-07-21 NOTE — Consult Note (Addendum)
 NEUROLOGY CONSULT NOTE   Date of service: Jul 21, 2023 Patient Name: Trevor Flores MRN:  [id number] [name]:  June 12, 1960 Chief Complaint: acute ischemic stroke Requesting Provider: Niu, Xilin, MD  History of Present Illness  Trevor Flores is a 63 y.o. male with hx of stage IV colon cancer with mets to liver currently on chemo, recent hospitalization for PE s/p thrombectomy and IVC filter placement discharged on eliquis , who was BIB family to ED for AMS x5 days. At baseline he is dependent on wife and son for most of his ADLs but is able to ambulate some with a walker. He recently established care with palliative as an outpatient and had started to consider GOC but had not made a decision yet regarding code status or considering comfort care. Patient started to get more confused Wed-Thurs of last week and has not been able to take po. He was febrile on arrival to ED and not following commands. EEG showed diffuse slowing and focal slowing over the left hemisphere. Brain MRI showed large acute L parietal ischemic infarct and numerous smaller acute embolic infarcts in multiple vascular distributions. He has new thrombocytopenia (plts 67).  LKW: 5 days ago Modified rankin score: 4-Needs assistance to walk and tend to bodily needs IV Thrombolysis: outside window  NIHSS components Score: Comment  1a Level of Conscious 0[]  1[]  2[x]  3[]      1b LOC Questions 0[]  1[]  2[x]       1c LOC Commands 0[]  1[]  2[x]       2 Best Gaze 0[]  1[x]  2[]       3 Visual 0[]  1[]  2[x]  3[]      4 Facial Palsy 0[]  1[x]  2[]  3[]      5a Motor Arm - left 0[]  1[]  2[]  3[x]  4[]  UN[]    5b Motor Arm - Right 0[]  1[]  2[]  3[]  4[x]  UN[]    6a Motor Leg - Left 0[]  1[]  2[]  3[x]  4[]  UN[]    6b Motor Leg - Right 0[]  1[]  2[]  3[]  4[x]  UN[]    7 Limb Ataxia 0[x]  1[]  2[]  UN[]      8 Sensory 0[]  1[x]  2[]  UN[]      9 Best Language 0[]  1[]  2[]  3[x]      10 Dysarthria 0[]  1[]  2[x]  UN[]      11 Extinct. 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 12/2011 Echo: EF 60%, no rwma.   Hemiparesis affecting right side as late effect of cerebrovascular accident (CVA) (HCC)    History of stress test    a. 04/2004 Ex MV: Ex time 10:30. Hypertensive response. No ischemia/infarct.   Hyperlipidemia    Hypertension    Long-term use of aspirin  therapy    Palpitations    Parkinson disease (HCC)    Personal history of gout    Possible Lacunar infarction (HCC)  a. 12/2011 Head CT: CSF density lateral to R cerebral peduncle - ? prior lacunar infarct.   Pre-syncope    a. 06/2014 Monitor: Sinus rhythm. No significant arrhythmia.   Resting tremor    Stroke [facility])    Vascular parkinsonism (HCC)    Vertigo    Weakness of both lower extremities     Past Surgical History:  Procedure Laterality Date   APPENDECTOMY     COLONOSCOPY WITH PROPOFOL  N/A 02/04/2023   Procedure: COLONOSCOPY WITH PROPOFOL ;  Surgeon: Toledo, Alphonsus Jeans, MD;  Location: [facility] ENDOSCOPY;  Service: Gastroenterology;  Laterality: N/A;   COLONOSCOPY WITH PROPOFOL  N/A 06/10/2023   Procedure: COLONOSCOPY WITH PROPOFOL ;  Surgeon: Toledo, Alphonsus Jeans, MD;  Location: [facility] ENDOSCOPY;  Service: Gastroenterology;  Laterality: N/A;   ESOPHAGOGASTRODUODENOSCOPY (EGD) WITH PROPOFOL  N/A 02/04/2023   Procedure: ESOPHAGOGASTRODUODENOSCOPY (EGD) WITH PROPOFOL ;  Surgeon: Toledo, Alphonsus Jeans, MD;  Location: [facility] ENDOSCOPY;  Service: Gastroenterology;  Laterality: N/A;    ESOPHAGOGASTRODUODENOSCOPY (EGD) WITH PROPOFOL  N/A 06/10/2023   Procedure: ESOPHAGOGASTRODUODENOSCOPY (EGD) WITH PROPOFOL ;  Surgeon: Toledo, Alphonsus Jeans, MD;  Location: [facility] ENDOSCOPY;  Service: Gastroenterology;  Laterality: N/A;   HEMORRHOID BANDING  ?   Dr Bradford Cadet   IVC FILTER INSERTION N/A 07/02/2023   Procedure: IVC FILTER INSERTION;  Surgeon: Celso College, MD;  Location: [facility] INVASIVE CV LAB;  Service: Cardiovascular;  Laterality: N/A;   POLYPECTOMY  06/10/2023   Procedure: POLYPECTOMY, INTESTINE;  Surgeon: Toledo, Alphonsus Jeans, MD;  Location: [facility] ENDOSCOPY;  Service: Gastroenterology;;   PORTACATH PLACEMENT N/A 06/26/2023   Procedure: INSERTION, TUNNELED CENTRAL VENOUS DEVICE, WITH PORT;  Surgeon: [name], MD;  Location: [facility] ORS;  Service: General;  Laterality: N/A;   PULMONARY THROMBECTOMY Bilateral 07/02/2023   Procedure: PULMONARY THROMBECTOMY;  Surgeon: Celso College, MD;  Location: [facility] INVASIVE CV LAB;  Service: Cardiovascular;  Laterality: Bilateral;    Family History: Family History  Problem Relation Age of Onset   Diabetes Brother    Colon cancer Neg Hx     Social History  reports that he has never smoked. He has quit using smokeless tobacco.  His smokeless tobacco use included chew. He reports that he does not drink alcohol and does not use drugs.  Allergies  Allergen Reactions   Other    Hydralazine  Itching and Palpitations    Medications   Current Facility-Administered Medications:    acyclovir  (ZOVIRAX ) 915 mg in dextrose  5 % 250 mL IVPB, 10 mg/kg, Intravenous, Q8H, Hunt, Madison H, RPH, Stopped at 07/21/23 1835   albuterol  (PROVENTIL ) (2.5 MG/3ML) 0.083% nebulizer solution 2.5 mg, 2.5 mg, Nebulization, Q4H PRN, Niu, Xilin, MD   ampicillin  (OMNIPEN) 2 g in sodium chloride  0.9 % 100 mL IVPB, 2 g, Intravenous, Q4H, Eleni Griffin, MD, Stopped at 07/21/23 1717   atorvastatin  (LIPITOR) tablet 40 mg, 40 mg, Oral, Daily, Niu, Xilin, MD   ceFEPIme   (MAXIPIME ) 2 g in sodium chloride  0.9 % 100 mL IVPB, 2 g, Intravenous, Q8H, Eleni Griffin, MD, Last Rate: 200 mL/hr at 07/21/23 2012, 2 g at 07/21/23 2012   dextromethorphan-guaiFENesin  (MUCINEX  DM) 30-600 MG per 12 hr tablet 1 tablet, 1 tablet, Oral, BID PRN, Niu, Xilin, MD   ibuprofen  (ADVIL ) tablet 200 mg, 200 mg, Oral, Q6H PRN, Niu, Xilin, MD   LORazepam  (ATIVAN ) injection 2 mg, 2 mg, Intravenous, Q2H PRN, Niu, Xilin, MD   ondansetron  (ZOFRAN ) injection 4 mg, 4 mg, Intravenous, Q8H PRN, Niu, Xilin, MD  Current Outpatient Medications:    albuterol  (VENTOLIN   HFA) 108 (90 Base) MCG/ACT inhaler, Inhale 1-2 puffs into the lungs every 4 (four) hours as needed., Disp: , Rfl:    allopurinol (ZYLOPRIM) 100 MG tablet, Take 100 mg by mouth daily., Disp: , Rfl:    apixaban  (ELIQUIS ) 5 MG TABS tablet, Take 2 tablets (10 mg total) by mouth 2 (two) times daily for 6 days, THEN 1 tablet (5 mg total) 2 (two) times daily., Disp: 100 tablet, Rfl: 0   carbidopa -levodopa  (SINEMET  IR) 25-100 MG tablet, Take 1 tablet by mouth 3 (three) times daily., Disp: , Rfl:    diltiazem  (TIAZAC ) 360 MG 24 hr capsule, Take 1 capsule (360 mg total) by mouth daily., Disp: 90 capsule, Rfl: 3   Docusate Sodium  (DSS) 100 MG CAPS, Take 1 capsule by mouth 2 (two) times daily., Disp: , Rfl:    doxazosin  (CARDURA ) 1 MG tablet, TAKE 1 TABLET BY MOUTH DAILY AS NEEDED FOR PRESSURE GREATER THAN 150, Disp: 90 tablet, Rfl: 0   fluticasone (FLONASE) 50 MCG/ACT nasal spray, Place into both nostrils daily as needed for allergies or rhinitis., Disp: , Rfl:    loratadine (CLARITIN) 10 MG tablet, Take 10 mg by mouth daily as needed for allergies., Disp: , Rfl:    losartan  (COZAAR ) 100 MG tablet, TAKE 1 TABLET(100 MG) BY MOUTH DAILY IN THE MORNING, Disp: 90 tablet, Rfl: 2   meclizine  (ANTIVERT ) 12.5 MG tablet, Take 1 tablet (12.5 mg total) by mouth 3 (three) times daily as needed for dizziness., Disp: 30 tablet, Rfl: 0   metoprolol  succinate  (TOPROL -XL) 25 MG 24 hr tablet, TAKE 1 TABLET(25 MG) BY MOUTH EVERY EVENING WITH FOOD (Patient taking differently: Take 12.5 mg by mouth daily.), Disp: 90 tablet, Rfl: 2   ondansetron  (ZOFRAN ) 8 MG tablet, Take 1 tablet (8 mg total) by mouth every 8 (eight) hours as needed for nausea or vomiting. Start on the third day after chemotherapy. (Patient not taking: Reported on 06/30/2023), Disp: 30 tablet, Rfl: 1   oxyCODONE  (OXY IR/ROXICODONE ) 5 MG immediate release tablet, Take 1 tablet (5 mg total) by mouth every 6 (six) hours as needed for severe pain (pain score 7-10) or moderate pain (pain score 4-6). (Patient taking differently: Take 2.5 mg by mouth every 6 (six) hours as needed for severe pain (pain score 7-10) or moderate pain (pain score 4-6).), Disp: 60 tablet, Rfl: 0   pantoprazole  (PROTONIX ) 40 MG tablet, Take 1 tablet (40 mg total) by mouth daily., Disp: 30 tablet, Rfl: 0   polyethylene glycol powder (GLYCOLAX /MIRALAX ) 17 GM/SCOOP powder, Take 17 g by mouth daily as needed., Disp: , Rfl:    prochlorperazine  (COMPAZINE ) 10 MG tablet, Take 1 tablet (10 mg total) by mouth every 6 (six) hours as needed for nausea or vomiting., Disp: , Rfl:    rosuvastatin  (CRESTOR ) 5 MG tablet, Take 5 mg by mouth daily., Disp: , Rfl:    senna-docusate (SENOKOT-S) 8.6-50 MG tablet, Take 2 tablets by mouth daily., Disp: 60 tablet, Rfl: 1   sildenafil (VIAGRA) 25 MG tablet, Take 25 mg by mouth as needed., Disp: , Rfl:    SPIRIVA HANDIHALER 18 MCG inhalation capsule, Place 18 mcg into inhaler and inhale daily., Disp: , Rfl:    Vitamin D, Ergocalciferol, (DRISDOL) 1.25 MG (50000 UNIT) CAPS capsule, Take 50,000 Units by mouth once a week., Disp: , Rfl:   Vitals   Vitals:   07/21/23 1530 07/21/23 1730 07/21/23 1800 07/21/23 1830  BP: 124/77 125/77 125/73 104/68  Pulse: 80 78 85  89  Resp: 17 15 18 17   Temp:      TempSrc:      SpO2: 100% 100% 100% 100%  Weight:        Body mass index is 27.4 kg/m.  Physical Exam    Gen: patient lying in bed, arousable to sternal rub, NAD CV: extremities appear well-perfused Resp: normal WOB  Neurologic exam MS: somnolent, arousable to sternal rub, does not follow commands Speech: no intelligible speech CN: PERRL, R gaze preference but crosses midline with oculocephalics, blinks to threat on L side only, R facial droop Motor & sensory: in response to noxious stimuli localizes LUE, minimal withdrawal LLE, no movement RUE or RLE Reflexes: 2+ symm with toes down bilat Coordination: UTA Gait: deferred   Labs/Imaging/Neurodiagnostic studies   CBC:  Recent Labs  Lab 01-Aug-2023 1108  WBC 5.2  NEUTROABS 3.8  HGB 9.7*  HCT 32.1*  MCV 88.4  PLT 67*   Basic Metabolic Panel:  Lab Results  Component Value Date   NA 143 08-01-2023   K 4.0 08-01-23   CO2 21 (L) 2023/08/01   GLUCOSE 82 01-Aug-2023   BUN 31 (H) Aug 01, 2023   CREATININE 1.26 (H) 2023-08-01   CALCIUM  8.5 (L) Aug 01, 2023   GFRNONAA >60 08/01/2023   GFRAA >60 02/22/2019   Lipid Panel: No results found for: "LDLCALC" HgbA1c: No results found for: "HGBA1C" Urine Drug Screen: No results found for: "LABOPIA", "COCAINSCRNUR", "LABBENZ", "AMPHETMU", "THCU", "LABBARB"  Alcohol Level No results found for: "ETH" INR  Lab Results  Component Value Date   INR 1.5 (H) 2023-08-01   APTT  Lab Results  Component Value Date   APTT 33 06/29/2023   AED levels: No results found for: "PHENYTOIN", "ZONISAMIDE", "LAMOTRIGINE", "LEVETIRACETA"   MRI Brain(Personally reviewed): Large acute L parietal ischemic infarct with numerous other smaller acute embolic strokes in multiple vascular distributions  ASSESSMENT   BREYDEN JEUDY is a 63 y.o. male with hx of stage IV colon cancer with mets to liver currently on chemo, recent hospitalization for PE s/p thrombectomy and IVC filter placement discharged on eliquis , who was BIB family to ED for AMS x5 days. On exam he is somnolent, does not follow commands, has no  intelligible speech and is not moving his R side. He has a large acute ischemic left MCA stroke and numerous smaller acute embolic strokes in multiple vascular distributions.  The etiology of the stroke is central embolic secondary to underlying hypercoagulability in the setting of malignancy.  I initially considered putting him on a heparin  drip stroke protocol (no bolus, low goal) however given his platelets of 67 I feel that the risk of hemorrhagic conversion of the stroke is too high to continue therapeutic anticoagulation at this time.  We will continue to follow his platelet count and reevaluate.  I feel that the stroke explains his exam and suspicion for meningitis is low. I discussed this with Dr. Rosalea Collin and we agree that it is reasonable to continue him on vanc and zosyn alone while awaiting blood cultures and that he does not require CNS antibiotic coverage at this time.  I was also concerned about seizure given the direction of his gaze deviation relative to his known stroke.  EEG did not show any active seizure activity but because we are unable to do continuous EEG at [facility] and he is certainly at risk for seizures I would recommend continuing him on Keppra  500 mg twice daily.  I had a long conversation with patient's  son and wife by phone and explained to him the severity of the stroke on top of his known major medical issues including stage IV colon cancer with liver mets.  He recently established with palliative care and had been presented with options regarding CODE STATUS and and the potential to transition to comfort care.  The last conversation that he had with his son and his wife he told them that he was still considering his options.  I told them that it would be certainly be reasonable to consider DNAR and comfort care at this time and they stated that they will think about it and discuss more with Dr. Rosalea Collin when he admits patient this evening.  RECOMMENDATIONS   - Admit for stroke workup -  Palliative care consult for GOC, consider transition to comfort care. I will also discuss this more with patient's family tmrw - Permissive HTN x48 hrs goal BP <220/110. PRN labetalol or hydralazine  if BP above these parameters. Avoid oral antihypertensives. - MRI brain wo contrast - CTA H&N - TTE - No indication for statin given overall prognosis - Hold anticoagulation in setting of severe thrombocytopenia. Will start PR aspirin   - OK to start pharmacologic DVT prophylaxis - Keppra  500mg  bid IV - q4 hr neuro checks - STAT head CT for any change in neuro exam - Tele - PT/OT/SLP - Stroke education - Amb referral to neurology upon discharge - Neurology will continue to follow  ______________________________________________________________________    Signed, Eleni Griffin, MD [facility]

## 2023-07-21 NOTE — Progress Notes (Signed)
 Pharmacy Antibiotic Note  Trevor Flores is a 63 y.o. 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  Pharmacy has been consulted for vancomycin  and acyclovir  dosing.  S/p vancomycin  1 g IV x 1, metronidazole  500 mg IV x 1, cefepime  2 g IV x 1  Plan: Give vancomycin  1000 mg IV x 1 to total a loading dose of 2000 mg. Check random vanc level tomorrow given AKI (SCr 1.26, baseline 0.7-1) Start acyclovir  915 mg (~10 mg/kg) IV Q8H Patient is also on ampicillin  2 g IV Q4H Patient is also on cefepime  2 g IV Q8H Continue to monitor renal function and follow culture results   Weight: 91.6 kg (202 lb)  Temp (24hrs), Avg:100.6 F (38.1 C), Min:99.9 F (37.7 C), Max:101.3 F (38.5 C)  Recent Labs  Lab 07/21/23 1108 07/21/23 1318  WBC 5.2  --   CREATININE 1.26*  --   LATICACIDVEN 2.5* 2.8*    Estimated Creatinine Clearance: 66.7 mL/min (A) (by C-G formula based on SCr of 1.26 mg/dL (H)).    Allergies  Allergen Reactions   Other    Hydralazine  Itching and Palpitations    Antimicrobials this admission: 5/19 Vanc >>  5/19 Cefepime  >>  5/19 Ampicillin >> 5/19 Acyclovir >> 5/19 Metronidazole  x 1   Microbiology results: 5/19 BCx: in process  Thank you for allowing pharmacy to be a part of this patient's care.  Alice Innocent, PharmD, BCPS 07/21/2023 3:17 PM

## 2023-07-21 NOTE — Progress Notes (Signed)
 Eeg done

## 2023-07-21 NOTE — ED Notes (Signed)
 Dr.Funke notified of pt's lactic acid level of 2.5

## 2023-07-21 NOTE — Sepsis Progress Note (Signed)
 Elink will follow per sepsis protocol.

## 2023-07-21 NOTE — ED Notes (Signed)
 Rosalea Collin, MD in room

## 2023-07-21 NOTE — ED Triage Notes (Signed)
 Pt to ED via POV. Pt had to be assisted out of car by ED staff. Pt's eyes are open but he is not verbal, pt will blink. Per pts son he is on active chemo for stage 4 colon cancer. Son reports pt has not been drinking much fluids he is having to give him fluids in a syringe. Pt taken to rm 2 with EDP at bedside.

## 2023-07-21 NOTE — Progress Notes (Signed)
 CODE SEPSIS - PHARMACY COMMUNICATION  **Broad Spectrum Antibiotics should be administered within 1 hour of Sepsis diagnosis**  Time Code Sepsis Called/Page Received: 1132  Antibiotics Ordered: cefepime , metronidazole , vancomycin    Time of 1st antibiotic administration: 1142  Additional action taken by pharmacy: N/A  If necessary, Name of Provider/Nurse Contacted: N/A    Alice Innocent ,PharmD Clinical Pharmacist  07/21/2023  11:48 AM

## 2023-07-21 NOTE — IPAL (Signed)
 Need to choose "IPAL" for type of note, need to open a new note billing [id number] for >   Interdisciplinary Goals of Care Family Meeting  Interdisciplinary Goals of Care Family Meeting Date carried out: 07/21/2023 Location of the meeting: ED  Member's involved: patient's wife Durable Power of Attorney or Environmental health practitioner:  no POA.  Discussion: We discussed goals of care for Trevor Flores .  I spent lengthy time with his wife to have discussed patient's goals of care.  Given patient's extremely poor prognosis, his wife agreed for comfort care from now. Code status: DNR Disposition: Home with Hospice possibly  Time spent for the meeting: 32 min Trevor Flores 07/21/2023, @NOW 

## 2023-07-22 ENCOUNTER — Encounter: Payer: Self-pay | Admitting: Oncology

## 2023-07-22 DIAGNOSIS — Z7189 Other specified counseling: Secondary | ICD-10-CM | POA: Diagnosis not present

## 2023-07-22 DIAGNOSIS — Z515 Encounter for palliative care: Secondary | ICD-10-CM | POA: Diagnosis not present

## 2023-07-22 LAB — HEMOGLOBIN A1C
Hgb A1c MFr Bld: 4.3 % — ABNORMAL LOW (ref 4.8–5.6)
Mean Plasma Glucose: 76.71 mg/dL

## 2023-07-22 NOTE — Consult Note (Cosign Needed Addendum)
 Consultation Note Date: 07/22/2023   Patient Name: Trevor Flores  [name]: [birth year]  MRN: [id number]  Age / Sex: 63 y.o., male  PCP: Antonio Baumgarten, MD Referring Physician: Althia Atlas, MD  Reason for Consultation: Establishing goals of care  HPI/Patient Profile: PER H&P: "Trevor Flores is a 63 y.o. 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   Per his wife at the bedside, pt has been confused in the past 5 days, which has been progressively worsening.  Patient has not been able to eat.  At baseline he is dependent on wife and son for most of his ADLs but is able to ambulate some with a walker. When I saw pt in ED, he is in comatose status, not arousable, not following command, not moving extremities even on painful stimuli. Per his wife, patient has some dry cough, no respiratory distress or SOB.  Patient is constipated, no nausea, vomiting, diarrhea or abdominal pain.  Does not seem to have chest pain at home. Pt was noted to have fever of 101.3 in ED."  Clinical Assessment and Goals of Care: Notes and labs reviewed.  Per notes, patient has already been shifted to comfort care.  In to see patient.  He is currently resting in bed with eyes closed, he does not arouse during my visit.  His wife is at bedside.  Wife discusses that their son typically goes with patient to his oncology appointments.  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    We discussed his diagnoses and interventions.  She discusses his poor quality of life that began after initiation of chemotherapy.  Wife discusses  that she goes to dialysis 3 days/week herself.  She states their son has been trying to help care for him as patient is too weak to do some ADLs himself.  She confirms plans for comfort care.  We discussed options.  TOC and to talk further with wife.  Per nursing, patient's blood pressure was less than 90 systolic this morning.  Discussed rechecking blood pressure as if his blood pressure is still low, he would be unstable to transfer out of the hospital.    SUMMARY OF RECOMMENDATIONS    Continue current comfort care orders. If stable to do so, and the patient discharges home, will need to transition from IV morphine  to concentrated oral solution.      Primary Diagnoses: Present on Admission:  Stroke [facility])  Bilateral pulmonary embolism (HCC)  Adenocarcinoma of colon (HCC)  Essential hypertension  IDA (iron  deficiency anemia)  Stage 3a chronic kidney disease (HCC)  Overweight (BMI 25.0-29.9)  Chronic deep vein thrombosis (DVT) of femoral vein of both lower extremities (HCC)  CAD (coronary artery disease)  Chronic diastolic CHF (congestive heart failure) (HCC)  Gout  SIRS (systemic inflammatory response syndrome) (HCC)  Thrombocytopenia (HCC)  Abnormal LFTs  Coma (HCC)   I have reviewed the medical record, interviewed the patient and family, and examined the patient. The following aspects are pertinent.  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 12/2011 Echo: EF 60%, no rwma.   Hemiparesis affecting right side as late effect of cerebrovascular  accident (CVA) (HCC)    History of stress test    a. 04/2004 Ex MV: Ex time 10:30. Hypertensive response. No ischemia/infarct.   Hyperlipidemia    Hypertension    Long-term use of aspirin  therapy    Palpitations    Parkinson disease (HCC)    Personal history of gout    Possible Lacunar infarction (HCC)    a. 12/2011 Head CT: CSF density lateral to R cerebral peduncle - ? prior lacunar infarct.   Pre-syncope    a. 06/2014 Monitor: Sinus rhythm. No significant arrhythmia.   Resting tremor    Stroke (HCC)    Vascular parkinsonism (HCC)    Vertigo    Weakness of both lower extremities    Social History   Socioeconomic History   Marital status: Married    Spouse name: Nand,Thelma (Spouse)   Number of children: Not on file   Years of education: Not on file   Highest education level: Not on file  Occupational History   Not on file  Tobacco Use   Smoking status: Never   Smokeless tobacco: Former    Types: Designer, multimedia Use   Vaping status: Never Used  Substance and Sexual Activity   Alcohol use: No   Drug use: Never   Sexual activity: Yes    Birth control/protection: None  Other Topics Concern   Not on file  Social History Narrative   Not on file   Social Drivers of Health   Financial Resource Strain: Low Risk  (07/09/2023)   Received from [facility]   Overall Financial Resource Strain (CARDIA)    Difficulty of Paying Living Expenses: Not hard at all  Food Insecurity: No Food Insecurity (07/22/2023)   Hunger Vital Sign    Worried About Running Out of Food in the Last Year: Never true    Ran Out of Food in the Last Year: Never true  Transportation Needs: No Transportation Needs (07/22/2023)   PRAPARE - Administrator, Civil Service (Medical): No    Lack of Transportation (Non-Medical): No  Physical Activity: Not on file  Stress: Not on file  Social Connections: Unknown (07/22/2023)   Social Connection and Isolation Panel [NHANES]     Frequency of Communication with Friends and Family: Not on file    Frequency of Social Gatherings with Friends and Family: Not on file    Attends Religious Services: Not on file    Active Member of Clubs or Organizations: Not on file    Attends Banker Meetings: Not on file    Marital Status:  Married   Family History  Problem Relation Age of Onset   Diabetes Brother    Colon cancer Neg Hx    Scheduled Meds:  levETIRAcetam   500 mg Intravenous Q12H   scopolamine   1 patch Transdermal Q72H   Continuous Infusions:  morphine  1 mg/hr (07/22/23 0028)   PRN Meds:.LORazepam , naphazoline-glycerin , ondansetron  (ZOFRAN ) IV Medications Prior to Admission:  Prior to Admission medications   Medication Sig Start Date End Date Taking? Authorizing Provider  albuterol  (VENTOLIN  HFA) 108 (90 Base) MCG/ACT inhaler Inhale 1-2 puffs into the lungs every 4 (four) hours as needed. 03/03/23   [provider]  allopurinol (ZYLOPRIM) 100 MG tablet Take 100 mg by mouth daily. 12/28/18   [provider]  apixaban  (ELIQUIS ) 5 MG TABS tablet Take 2 tablets (10 mg total) by mouth 2 (two) times daily for 6 days, THEN 1 tablet (5 mg total) 2 (two) times daily. 07/04/23 08/09/23  Donaciano Frizzle, MD  carbidopa -levodopa  (SINEMET  IR) 25-100 MG tablet Take 1 tablet by mouth 3 (three) times daily.    [provider]  diltiazem  (TIAZAC ) 360 MG 24 hr capsule Take 1 capsule (360 mg total) by mouth daily. 03/19/23   Furth, Cadence H, PA-C  Docusate Sodium  (DSS) 100 MG CAPS Take 1 capsule by mouth 2 (two) times daily. 04/10/23   [provider]  doxazosin  (CARDURA ) 1 MG tablet TAKE 1 TABLET BY MOUTH DAILY AS NEEDED FOR PRESSURE GREATER THAN 150 12/10/21   Gollan, Timothy J, MD  fluticasone (FLONASE) 50 MCG/ACT nasal spray Place into both nostrils daily as needed for allergies or rhinitis.    [provider]  loratadine (CLARITIN) 10 MG tablet Take 10 mg by mouth daily as needed for  allergies.    [provider]  losartan  (COZAAR ) 100 MG tablet TAKE 1 TABLET(100 MG) BY MOUTH DAILY IN THE MORNING 04/14/23   Gollan, Timothy J, MD  meclizine  (ANTIVERT ) 12.5 MG tablet Take 1 tablet (12.5 mg total) by mouth 3 (three) times daily as needed for dizziness. 07/08/23   Collis Deaner, MD  metoprolol  succinate (TOPROL -XL) 25 MG 24 hr tablet TAKE 1 TABLET(25 MG) BY MOUTH EVERY EVENING WITH FOOD Patient taking differently: Take 12.5 mg by mouth daily. 04/14/23   Gollan, Timothy J, MD  ondansetron  (ZOFRAN ) 8 MG tablet Take 1 tablet (8 mg total) by mouth every 8 (eight) hours as needed for nausea or vomiting. Start on the third day after chemotherapy. Patient not taking: Reported on 06/30/2023 06/25/23   [name], MD  oxyCODONE  (OXY IR/ROXICODONE ) 5 MG immediate release tablet Take 1 tablet (5 mg total) by mouth every 6 (six) hours as needed for severe pain (pain score 7-10) or moderate pain (pain score 4-6). Patient taking differently: Take 2.5 mg by mouth every 6 (six) hours as needed for severe pain (pain score 7-10) or moderate pain (pain score 4-6). 06/27/23   [name], MD  pantoprazole  (PROTONIX ) 40 MG tablet Take 1 tablet (40 mg total) by mouth daily. 01/26/22 07/14/23  Stafford Eagles, PA-C  polyethylene glycol powder (GLYCOLAX /MIRALAX ) 17 GM/SCOOP powder Take 17 g by mouth daily as needed.    [provider]  prochlorperazine  (COMPAZINE ) 10 MG tablet Take 1 tablet (10 mg total) by mouth every 6 (six) hours as needed for nausea or vomiting. 07/14/23   [name], MD  rosuvastatin  (CRESTOR ) 5 MG tablet Take 5 mg by mouth daily. 01/07/19   [provider]  senna-docusate (SENOKOT-S) 8.6-50 MG tablet Take  2 tablets by mouth daily. 06/17/23   [name], MD  sildenafil (VIAGRA) 25 MG tablet Take 25 mg by mouth as needed.    [provider]  SPIRIVA HANDIHALER 18 MCG inhalation capsule Place 18 mcg into inhaler and inhale daily.    [provider]  Vitamin  D, Ergocalciferol, (DRISDOL) 1.25 MG (50000 UNIT) CAPS capsule Take 50,000 Units by mouth once a week. 01/28/23   [provider]   Allergies  Allergen Reactions   Other    Hydralazine  Itching and Palpitations   Review of Systems  Unable to perform ROS   Physical Exam Constitutional:      Comments: Eyes closed.  No distress noted at this time.  Pulmonary:     Effort: Pulmonary effort is normal.  Skin:    General: Skin is warm and dry.     Vital Signs: BP (!) 85/57   Pulse 82   Temp 98.1 F (36.7 C) (Oral)   Resp 15   Wt 91.6 kg   SpO2 98%   BMI 27.40 kg/m  Pain Scale: Not given for pain   Pain Score: 0-No pain   SpO2: SpO2: 98 % O2 Device:SpO2: 98 % O2 Flow Rate: .   IO: Intake/output summary:  Intake/Output Summary (Last 24 hours) at 07/22/2023 1440 Last data filed at 07/21/2023 1846 Gross per 24 hour  Intake 550 ml  Output 650 ml  Net -100 ml    LBM:   Baseline Weight: Weight: 91.6 kg Most recent weight: Weight: 91.6 kg       Signed by: Meribeth Standard, NP   Please contact Palliative Medicine Team phone at [phone] for questions and concerns.  For individual provider: See Tilford Foley

## 2023-07-22 NOTE — ED Notes (Signed)
 RN assisted pt in transfer from ED stretcher to inpatient bed. Pt was rolled and cleaned before being placed on cardiac monitoring and suction with male Pure Wick. Pt still nonverbal and responsive to pain. Pt spouse is at the bedside, provided with recliner for comfort.   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 12/2011 Echo: EF 60%, no rwma.   Hemiparesis affecting right side as late effect of cerebrovascular accident (CVA) (HCC)    History of stress test    a. 04/2004 Ex MV: Ex time 10:30. Hypertensive response. No ischemia/infarct.   Hyperlipidemia    Hypertension    Long-term use of aspirin  therapy    Palpitations    Parkinson disease (HCC)    Personal history of gout    Possible Lacunar infarction (HCC)    a. 12/2011 Head CT: CSF density lateral to R cerebral peduncle - ? prior lacunar infarct.   Pre-syncope    a. 06/2014 Monitor: Sinus rhythm. No significant arrhythmia.   Resting tremor    Stroke [facility])    Vascular parkinsonism (HCC)    Vertigo    Weakness of both lower extremities

## 2023-07-22 NOTE — TOC Progression Note (Addendum)
 Transition of Care [facility]) - Progression Note    Patient Details  Name: Trevor Flores MRN: [id number] Date of Birth: 1960/11/15  Transition of [facility] CM/SW Contact  Arminda Landmark, RN Phone Number: 07/22/2023, 2:15 PM  Clinical Narrative:    Received Epic chat that this pt needs hospice. Entered ED room and his wife is at bedside, tearful, speaking to [facility] the palliative care NP. Pt is unresponsive and only moaning when being turned, etc. He has a morphine  gtt going. He was diagnosed with stage 4 colon CA 3 years ago. Today he appears to be actively dying and his BP is dropping per ED nurse. Discussed at length having him transferred to an inpatient hospice facility vs home with hospice. In conclusion his wife has decided he's too close to dying and would rather keep him here for now. If he changes and should become more alert will re-address hospice at that time. Provided active listening and reassurance to spouse and in the end she appears to feel comfortable with this decision. 1620: Met with wife again to make sure she hasn't changed her mind about him going to inpatient hospice. She's spoken to her son and feels comfortable with this decision. Pt has been moved out of the ED. He still has the morphine  drip running and appears comfortable. TOC to continue to follow for further needs.         Expected Discharge Plan and Services                                               Social Determinants of Health (SDOH) Interventions SDOH Screenings   Food Insecurity: No Food Insecurity (07/22/2023)  Housing: Low Risk  (07/22/2023)  Transportation Needs: No Transportation Needs (07/22/2023)  Utilities: Not At Risk (07/22/2023)  Financial Resource Strain: Low Risk  (07/09/2023)   Received from [facility]  Social Connections: Unknown (07/22/2023)  Tobacco Use: Medium Risk (07/21/2023)    Readmission Risk Interventions     No data to display

## 2023-07-22 NOTE — Procedures (Addendum)
 Routine EEG Report  Trevor Flores is a 63 y.o. male with a history of altered mental status who is undergoing an EEG to evaluate for seizures.  Report: This EEG was acquired with electrodes placed according to the International 10-20 electrode system (including Fp1, Fp2, F3, F4, C3, C4, P3, P4, O1, O2, T3, T4, T5, T6, A1, A2, Fz, Cz, Pz). The following electrodes were missing or displaced: none.  The occipital dominant rhythm was 6 Hz. This activity is reactive to stimulation. Drowsiness was manifested by background fragmentation; deeper stages of sleep were identified by K complexes and sleep spindles. There was focal slowing over the left posterior region. There were no interictal epileptiform discharges. There were no electrographic seizures identified. Photic stimulation and hyperventilation were not performed.  Impression and clinical correlation: This EEG was obtained while awake and asleep and is abnormal due to mild diffuse slowing indicative of global cerebral dysfunction as well as focal slowing over the left posterior in the region of patient's known acute infarct. Epileptiform abnormalities were not seen during this recording.  Greg Leaks, MD [facility] [phone]  If 7pm- 7am, please page neurology on call as listed in [facility]ION.

## 2023-07-22 NOTE — ED Notes (Signed)
 Pt resting peacefully in bed. Pt ABCs intact. RR even and unlabored. Pt in NAD. Bed in lowest locked position. Call bell in reach. Spouse resting comfortably in recliner.

## 2023-07-22 NOTE — ED Notes (Signed)
 PT still resting comfortably. Pt ABCs intact. RR even and unlabored. Pt in NAD. Bed in lowest locked position. Call bell in reach. Pt spouse in recliner and denies needs at this time.

## 2023-07-22 NOTE — ED Notes (Signed)
 Pt resting peacefully in bed. Pt ABCs intact. RR even and unlabored. Pt in NAD. Bed in lowest locked position. Call bell in reach. Spouse resting comfortably in recliner. Pt VSS.

## 2023-07-22 NOTE — Progress Notes (Signed)
 [facility] Progress Note  Patient: Trevor Flores    ZOX:[id number]  DOA: 07/21/2023     Date of Service: the patient was seen and examined on 07/22/2023  Chief Complaint  Patient presents with   Altered Mental Status   Brief hospital course: Trevor Flores is a 63 y.o. 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  Could not do LP due to current use of Eliquis .  Patient received 1 dose of acyclovir , vancomycin , ampicillin , cefepime  and Flagyl  due to concerning for meningitis.  Also received loading dose of 2 g of Keppra  for possible seizure.  Patient remains comatose status. Neurology was consulted. His prognosis is extremely poor. Extensive discussion was done with his wife, who is very supportive and clearly understands patient's extremely poor prognosis. She agreed for comfort care, which is very reasonable.  CODE STATUS DNR-limited  -Patient was admitted for comfort care. -Stop drawing labs and IVF -prn LORazepam  for agitation - IV morphine  infusion for pain -Naphazoline 0.1 % ophthalmic solution -scopolamine  patch -Psycho/Social: emotional support offered to his wife at bedside -consult to palliterative care team -Nurse may pronounce death      Other active medical issues are listed as below, will  focus on comfort care only now     Stroke [facility])   Coma   SIRS (systemic inflammatory response syndrome) (HCC)   Adenocarcinoma of colon (HCC)   Chronic deep vein thrombosis (DVT) of femoral vein of both lower extremities (HCC)   S/P insertion of IVC (inferior vena caval) filter   Thrombocytopenia (HCC)   Bilateral pulmonary embolism (HCC)   Abnormal LFTs   Essential hypertension   CAD (coronary artery disease)   Chronic diastolic CHF (congestive heart failure) (HCC)   Gout   Stage 3a chronic kidney disease (HCC)   IDA (iron  deficiency anemia)   Overweight (BMI 25.0-29.9)  Body mass index is 27.4 kg/m.  Interventions:  Diet: N.p.o. DVT Prophylaxis: Comfort care  Advance goals of care discussion: DNR-limited  Family Communication: family was present at bedside, at the time of interview.  Patient remained obtunded, lying comfortably on morphine  IV infusion.   Disposition:  Pt is from home, admitted with acute CVA, transition to comfort care, on morphine  drip, which precludes a safe discharge. Discharge to home with hospice services versus may possibly during hospital stay.  Currently patient is not a stable to transfer. Palliative care and hospice team is following. TOC following   Subjective: No significant events overnight, patient remained comfortable on IV morphine  infusion. Patient's wife was at bedside, all question and concerns answered.  Physical Exam: General: NAD, lying comfortably Eyes: Closed  Cardiovascular: S1 and S2 Present  Respiratory: Breathing comfortably Neurologic: Remained obtunded, unable to follow command.     Vitals:   07/22/23 0730 07/22/23 0740 07/22/23 0750 07/22/23 0900  BP: (!) 88/59   (!) 85/57  Pulse: 81 80 81 82  Resp: 16 15 15  15  Temp:      TempSrc:      SpO2: 98% 98% 98% 98%  Weight:        Intake/Output Summary (Last 24 hours) at 07/22/2023 1509 Last data filed at 07/21/2023 1846 Gross per 24 hour  Intake 550 ml  Output  650 ml  Net -100 ml   Filed Weights   07/21/23 1059  Weight: 91.6 kg    Data Reviewed: I have personally reviewed and interpreted daily labs, tele strips, imagings as discussed above. I reviewed all nursing notes, pharmacy notes, vitals, pertinent old records I have discussed plan of care as described above with RN and patient/family.  CBC: Recent Labs  Lab 07/21/23 1108  WBC 5.2  NEUTROABS 3.8  HGB 9.7*  HCT 32.1*  MCV 88.4  PLT 67*   Basic Metabolic Panel: Recent Labs  Lab 07/21/23 1108  NA 143  K 4.0  CL 106  CO2 21*  GLUCOSE 82  BUN 31*  CREATININE 1.26*  CALCIUM  8.5*  MG 2.5*    Studies: MR BRAIN W WO CONTRAST Result Date: 07/21/2023 CLINICAL DATA:  Headache, altered mental status. History of colon cancer. EXAM: MRI HEAD WITHOUT AND WITH CONTRAST TECHNIQUE: Multiplanar, multiecho pulse sequences of the brain and surrounding structures were obtained without and with intravenous contrast. CONTRAST:  7.5mL GADAVIST  GADOBUTROL  1 MMOL/ML IV SOLN COMPARISON:  CT head earlier same day. FINDINGS: Brain: Large focus of acute infarct within the left MCA territory involving the left insular cortex, posterior left temporal lobe, and left parietal lobe. There is associated edema and sulcal effacement within this region without associated midline shift. Numerous additional scattered areas of restricted diffusion throughout the bilateral frontal and parietal lobes primarily involving the cortex and subcortical white matter. Additional restricted diffusion in the bilateral basal ganglia involving the bilateral caudate heads. Additional restricted diffusion in the bilateral thalami. Scattered foci of cortical and subcortical restricted diffusion in the bilateral occipital lobes and posterior right temporal lobe as well as infarcts in the bilateral cerebellar hemispheres. There is a small focus of enhancement within the posterior left frontal lobe involving the precentral gyrus within the  region of infarct. Scattered white matter signal abnormality suggestive of mild chronic microvascular ischemic changes. The basilar cisterns are patent. Choroid fissure cyst on the right. No evidence of intracranial hemorrhage. Ventricles are unremarkable. Vascular: Normal flow voids. Skull and upper cervical spine: Normal marrow signal. Sinuses/Orbits: Orbits are symmetric. Mucosal thickening in the left maxillary and left ethmoid sinuses. Other: Mastoid air cells are clear. IMPRESSION: Multiple scattered areas of acute infarct as described above most pronounced within the left MCA territory. Given bilateral cerebral and cerebellar involvement is concern for embolic etiology. Small focus of enhancement in the posterior left frontal lobe involving the precentral gyrus which could reflect enhancement within a region of subacute infarct. However, given reported history of colon cancer recommend repeat MRI with without contrast in 3 months to document resolution. Electronically Signed   By: Denny Flack M.D.   On: 07/21/2023 21:39    Scheduled Meds:  levETIRAcetam   500 mg Intravenous Q12H   scopolamine   1 patch Transdermal Q72H   Continuous Infusions:  morphine  1 mg/hr (07/22/23 0028)   PRN Meds: LORazepam , naphazoline-glycerin , ondansetron  (ZOFRAN ) IV  Time spent: 35 minutes  Author: Althia Atlas. MD [facility] 07/22/2023 3:09 PM  To reach On-call, see care teams to locate the attending and reach out to them via [email or web address]. If 7PM-7AM, please contact night-coverage If you  still have difficulty reaching the attending provider, please page the [facility] (Director on Call) for [facility] on amion for assistance.

## 2023-07-22 NOTE — ED Notes (Signed)
 Pt resting peacefully in bed. Pt ABCs intact. RR even and unlabored. Pt in NAD. Bed in lowest locked position. Call bell in reach. Pt spouse denies any needs at this time. VSS.

## 2023-07-22 NOTE — Progress Notes (Signed)
 Pt to room 109 with wife at bedside. Pt resting comfortably with Morphine  drip infusing, see MAR.

## 2023-07-23 DIAGNOSIS — Z7189 Other specified counseling: Secondary | ICD-10-CM | POA: Diagnosis not present

## 2023-07-23 DIAGNOSIS — Z515 Encounter for palliative care: Secondary | ICD-10-CM | POA: Diagnosis not present

## 2023-07-23 NOTE — Progress Notes (Signed)
 Daily Progress Note   Patient Name: Trevor Flores       Date: 07/23/2023 [name]: 1960/08/21  Age: 63 y.o. MRN#: [id number] Attending Physician: Althia Atlas, MD Primary Care Physician: Antonio Baumgarten, MD Admit Date: 07/21/2023  Reason for Consultation/Follow-up: Establishing goals of care  Subjective: Notes reviewed.  In to see patient.  He is currently resting in bed, no family at bedside.  Eyes closed, even and unlabored respirations.  He does not arouse to touch or voice.  No distress noted at this time.  No changes to symptom management recommended at this time.  Anticipate hospital death.  Length of Stay: 2  Current Medications: Scheduled Meds:   levETIRAcetam   500 mg Intravenous Q12H   scopolamine   1 patch Transdermal Q72H    Continuous Infusions:  morphine  1 mg/hr (07/22/23 1801)    PRN Meds: LORazepam , naphazoline-glycerin , ondansetron  (ZOFRAN ) IV  Physical Exam Constitutional:      Comments: Eyes closed.  No distress noted  Pulmonary:     Effort: Pulmonary effort is normal.  Skin:    General: Skin is warm and dry.             Vital Signs: BP (!) 94/57 (BP Location: Left Arm)   Pulse 86   Temp 99.5 F (37.5 C)   Resp 14   Ht 6' (1.829 m)   Wt 91.6 kg   SpO2 93%   BMI 27.40 kg/m  SpO2: SpO2: 93 % O2 Device: O2 Device: Room Air O2 Flow Rate:    Intake/output summary:  Intake/Output Summary (Last 24 hours) at 07/23/2023 1307 Last data filed at 07/23/2023 0424 Gross per 24 hour  Intake 17.54 ml  Output --  Net 17.54 ml   LBM:   Baseline Weight: Weight: 91.6 kg Most recent weight: Weight: 91.6 kg         Patient Active Problem List   Diagnosis Date Noted   Stroke (HCC) 07/21/2023   CAD (coronary artery disease) 07/21/2023   Chronic diastolic  CHF (congestive heart failure) (HCC) 07/21/2023   Gout 07/21/2023   SIRS (systemic inflammatory response syndrome) (HCC) 07/21/2023   Thrombocytopenia (HCC) 07/21/2023   Abnormal LFTs 07/21/2023   Comfort measures only status 07/21/2023   Coma (HCC) 07/21/2023   Encounter for antineoplastic chemotherapy 07/14/2023   Liver lesion 07/14/2023  Neoplasm related pain 07/14/2023   Chronic deep vein thrombosis (DVT) of femoral vein of both lower extremities (HCC) 07/03/2023   S/P insertion of IVC (inferior vena caval) filter 07/03/2023   Metabolic acidosis 07/02/2023   Overweight (BMI 25.0-29.9) 07/02/2023   Stage 3a chronic kidney disease (HCC) 06/29/2023   Elevated troponin 06/29/2023   Bilateral pulmonary embolism (HCC) 06/29/2023   Acute pulmonary embolism (HCC) 06/29/2023   Adenocarcinoma of colon (HCC) 06/17/2023   CKD (chronic kidney disease) 06/17/2023   Normocytic anemia 06/17/2023   Weight loss 06/17/2023   Goals of care, counseling/discussion 06/17/2023   Constipation 06/17/2023   IDA (iron  deficiency anemia) 06/17/2023   Encounter for screening colonoscopy 10/22/2016   Chest pain 01/22/2016   Near syncope 05/25/2014   Dizziness 08/19/2012   Vertigo 08/19/2012   Essential hypertension 08/19/2012   Morbid obesity (HCC) 08/19/2012    Palliative Care Assessment & Plan     Recommendations/Plan: No changes recommended to current symptom management regimen. Anticipate hospital death.  Code Status:    Code Status Orders  (From admission, onward)           Start     Ordered   07/21/23 2209  Do not attempt resuscitation (DNR)- Limited -Do Not Intubate (DNI)  (Code Status)  Continuous       Question Answer Comment  If pulseless and not breathing No CPR or chest compressions.   In Pre-Arrest Conditions (Patient Is Breathing and Has A Pulse) Do not intubate. Provide all appropriate non-invasive medical interventions. Avoid ICU transfer unless indicated or required.    Consent: Discussion documented in EHR or advanced directives reviewed      07/21/23 2208           Code Status History     Date Active Date Inactive Code Status Order ID Comments User Context   07/21/2023 2126 07/21/2023 2208 Full Code [id number]  Fidencio Hue, MD ED   06/29/2023 2333 07/04/2023 1812 Full Code [id number]  Lanetta Pion, MD ED       Prognosis:  Hours - Days   Thank you for allowing the Palliative Medicine Team to assist in the care of this patient.   Meribeth Standard, NP  Please contact Palliative Medicine Team phone at [phone] for questions and concerns.

## 2023-07-23 NOTE — Progress Notes (Signed)
 [facility] Progress Note  Patient: Trevor Flores    FIE:[id number]  DOA: 07/21/2023     Date of Service: the patient was seen and examined on 07/23/2023  Chief Complaint  Patient presents with   Altered Mental Status   Brief hospital course: Trevor Flores is a 63 y.o. 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  Could not do LP due to current use of Eliquis .  Patient received 1 dose of acyclovir , vancomycin , ampicillin , cefepime  and Flagyl  due to concerning for meningitis.  Also received loading dose of 2 g of Keppra  for possible seizure.  Patient remains comatose status. Neurology was consulted. His prognosis is extremely poor. Extensive discussion was done with his wife, who is very supportive and clearly understands patient's extremely poor prognosis. She agreed for comfort care, which is very reasonable.  CODE STATUS DNR-limited  -Patient was admitted for comfort care. -Stop drawing labs and IVF -prn LORazepam  for agitation - IV morphine  infusion for pain -Naphazoline 0.1 % ophthalmic solution -scopolamine  patch -Psycho/Social: emotional support offered to his wife at bedside -consult to palliterative care team -Nurse may pronounce death      Other active medical issues are listed as below, will  focus on comfort care only now     Stroke [facility])   Coma   SIRS (systemic inflammatory response syndrome) (HCC)   Adenocarcinoma of colon (HCC)   Chronic deep vein thrombosis (DVT) of femoral vein of both lower extremities (HCC)   S/P insertion of IVC (inferior vena caval) filter   Thrombocytopenia (HCC)   Bilateral pulmonary embolism (HCC)   Abnormal LFTs   Essential hypertension   CAD (coronary artery disease)   Chronic diastolic CHF (congestive heart failure) (HCC)   Gout   Stage 3a chronic kidney disease (HCC)   IDA (iron  deficiency anemia)   Overweight (BMI 25.0-29.9)  Body mass index is 27.4 kg/m.  Interventions:  Diet: N.p.o. DVT Prophylaxis: Comfort care  Advance goals of care discussion: DNR-limited  Family Communication: family was not present at bedside, at the time of interview.  Patient remained obtunded, lying comfortably on morphine  IV infusion.   Disposition:  Pt is from home, admitted with acute CVA, transition to comfort care, on morphine  drip, which precludes a safe discharge. Discharge to home with hospice services vs may pass away during hospital stay.  Currently patient is not a stable to transfer. Palliative care and hospice team is following. TOC following   Subjective: No significant events overnight, patient remained comfortable on IV morphine  infusion.  Physical Exam: General: NAD, lying comfortably Eyes: Closed  Cardiovascular: S1 and S2 Present  Respiratory: Breathing comfortably Neurologic: Remained obtunded, unable to follow command.     Vitals:   07/22/23 0900 07/22/23 2047 07/23/23 0754 07/23/23 0830  BP: (!) 85/57 (!) 92/57 (!) 94/57   Pulse: 82 84 86   Resp: 15 14 14    Temp:  99.2 F (37.3  C) 99.5 F (37.5 C)   TempSrc:  Oral    SpO2: 98% 94% 93%   Weight:      Height:    6' (1.829 m)    Intake/Output Summary (Last 24 hours) at 07/23/2023 1159 Last data filed at 07/23/2023 0424 Gross per 24 hour  Intake 17.54 ml  Output  --  Net 17.54 ml   Filed Weights   07/21/23 1059  Weight: 91.6 kg    Data Reviewed: I have personally reviewed and interpreted daily labs, tele strips, imagings as discussed above. I reviewed all nursing notes, pharmacy notes, vitals, pertinent old records I have discussed plan of care as described above with RN and patient/family.  CBC: Recent Labs  Lab 07/21/23 1108  WBC 5.2  NEUTROABS 3.8  HGB 9.7*  HCT 32.1*  MCV 88.4  PLT 67*   Basic Metabolic Panel: Recent Labs  Lab 07/21/23 1108  NA 143  K 4.0  CL 106  CO2 21*  GLUCOSE 82  BUN 31*  CREATININE 1.26*  CALCIUM  8.5*  MG 2.5*    Studies: No results found.   Scheduled Meds:  levETIRAcetam   500 mg Intravenous Q12H   scopolamine   1 patch Transdermal Q72H   Continuous Infusions:  morphine  1 mg/hr (07/22/23 1801)   PRN Meds: LORazepam , naphazoline-glycerin , ondansetron  (ZOFRAN ) IV  Time spent: 25 minutes  Author: Althia Atlas. MD [facility] 07/23/2023 11:59 AM  To reach On-call, see care teams to locate the attending and reach out to them via [email or web address]. If 7PM-7AM, please contact night-coverage If you still have difficulty reaching the attending provider, please page the [facility] (Director on Call) for [facility] on amion for assistance.

## 2023-07-23 NOTE — Plan of Care (Signed)
 Trevor Flores

## 2023-07-23 NOTE — TOC Progression Note (Signed)
 Transition of Care [facility]) - Progression Note    Patient Details  Name: Trevor Flores MRN: [id number] Date of Birth: September 21, 1960  [facility] of [facility]) CM/SW Contact  Elsie Halo, RN Phone Number: 07/23/2023, 3:33 PM  Clinical Narrative:    Patient is comfort care and is actively transitioning. He does not arouse to voice or touch at this time. Plan is for a hospital death. Please outreach to [facility] if new needs identified.        Expected Discharge Plan and Services                                               Social Determinants of Health (SDOH) Interventions SDOH Screenings   Food Insecurity: No Food Insecurity (07/22/2023)  Housing: Low Risk  (07/22/2023)  Transportation Needs: No Transportation Needs (07/22/2023)  Utilities: Not At Risk (07/22/2023)  Financial Resource Strain: Low Risk  (07/09/2023)   Received from [facility]  Social Connections: Unknown (07/22/2023)  Tobacco Use: Medium Risk (07/21/2023)    Readmission Risk Interventions     No data to display

## 2023-07-24 DIAGNOSIS — Z515 Encounter for palliative care: Secondary | ICD-10-CM | POA: Diagnosis not present

## 2023-07-24 DIAGNOSIS — Z7189 Other specified counseling: Secondary | ICD-10-CM | POA: Diagnosis not present

## 2023-07-24 MED ORDER — CHLORHEXIDINE GLUCONATE CLOTH 2 % EX PADS
6.0000 | MEDICATED_PAD | Freq: Every day | CUTANEOUS | Status: DC
  Administered 2023-07-24 – 2023-07-27 (×4): 6 via TOPICAL

## 2023-07-24 NOTE — Progress Notes (Addendum)
   07/24/23 1700  Spiritual Encounters  Type of Visit Initial  Care provided to: Pt and family  Referral source Nurse (RN/NT/LPN)  Reason for visit End-of-life  OnCall Visit Yes  Spiritual Framework  Presenting Themes Significant life change;Coping tools;Goals in life/care;Values and beliefs  Community/Connection Family;Friend(s);Significant other;Faith community;Spiritual leader  Strengths Strong SUpport System  Interventions  Spiritual Care Interventions Made Established relationship of care and support;Compassionate presence;Reflective listening;Normalization of emotions;Meaning making;Bereavement/grief support;Prayer  Intervention Outcomes  Outcomes Patient family open to resources  Spiritual Care Plan  Spiritual Care Issues Still Outstanding No further spiritual care needs at this time (see row info)   Chaplain responded to nurse consult from day Chaplain. Patient was sleeping and Chaplain spoke with his wife Trevor Flores. Wife was sad and sharing stories of her life with patient. Chaplain provided a compassionate non-anxious presence and reflective listening as wife shared meaning making stories. Chaplain also provided prayer as requested by patient's wife. Wife shared that [facility] is not necessary as her step son is a Education officer, environmental. Chaplain services are available for follow up as needed.

## 2023-07-24 NOTE — Plan of Care (Signed)
  Problem: Coping: Goal: Will identify appropriate support needs Outcome: Progressing   Problem: Health Behavior/Discharge Planning: Goal: Goals will be collaboratively established with patient/family Outcome: Progressing

## 2023-07-24 NOTE — Care Management Important Message (Signed)
 Important Message  Patient Details  Name: Trevor Flores MRN: [id number] Date of Birth: Oct 27, 1960   Important Message Given:  Yes - Medicare IM     Avel Ogawa W, CMA 07/24/2023, 12:33 PM

## 2023-07-24 NOTE — Progress Notes (Addendum)
 Daily Progress Note   Patient Name: Trevor Flores       Date: 07/24/2023 [name]: March 08, 1960  Age: 63 y.o. MRN#: [id number] Attending Physician: Althia Atlas, MD Primary Care Physician: Antonio Baumgarten, MD Admit Date: 07/21/2023  Reason for Consultation/Follow-up: Establishing goals of care  Subjective: Notes reviewed.  In to see patient with pharmacist Murphy Arn.  He is currently resting in bed at this time, eyes closed with even and unlabored respirations.  Noted stress noted.  Wife is present at bedside.  Wife discusses funeral arrangements.  Therapeutic listening provided as she discusses their family and shares the complex dynamics between the various family members, and her concerns.  We discussed that she can speak with staff regarding family visitation while here at the hospital.  Discussed expectations for end-of-life and questions answered as able.  No changes to symptom management recommended at this time.  She discusses her husband's faith and beliefs.  Wife states she will have her son call the patient's [employer] to get someone to come and visit him.  She states she and her husband are of great faith but have not been able to go to church.  She requests to speak with a [employer] or fundamental [facility] male pastor.  Spiritual care consult placed after leaving the room and spoke with chaplain regarding wife's request.  Length of Stay: 3  Current Medications: Scheduled Meds:   Chlorhexidine  Gluconate Cloth  6 each Topical Q0600   levETIRAcetam   500 mg Intravenous Q12H   scopolamine   1 patch Transdermal Q72H    Continuous Infusions:  morphine  1 mg/hr (07/22/23 1801)    PRN Meds: LORazepam , naphazoline-glycerin , ondansetron  (ZOFRAN ) IV  Physical  Exam Constitutional:      Comments: Eyes closed  Pulmonary:     Effort: Pulmonary effort is normal.     Comments: Even and unlabored            Vital Signs: BP (!) 74/46 (BP Location: Left Arm)   Pulse 90   Temp 97.7 F (36.5 C)   Resp 19   Ht 6' (1.829 m)   Wt 91.6 kg   SpO2 95%   BMI 27.40 kg/m  SpO2: SpO2: 95 % O2 Device: O2 Device: Room Air O2 Flow Rate:    Intake/output summary:  Intake/Output Summary (Last 24 hours)  at 07/24/2023 1622 Last data filed at 07/23/2023 1700 Gross per 24 hour  Intake --  Output 250 ml  Net -250 ml   LBM: Last BM Date :  (unknown per pt wife) Baseline Weight: Weight: 91.6 kg Most recent weight: Weight: 91.6 kg   Patient Active Problem List   Diagnosis Date Noted   Stroke (HCC) 07/21/2023   CAD (coronary artery disease) 07/21/2023   Chronic diastolic CHF (congestive heart failure) (HCC) 07/21/2023   Gout 07/21/2023   SIRS (systemic inflammatory response syndrome) (HCC) 07/21/2023   Thrombocytopenia (HCC) 07/21/2023   Abnormal LFTs 07/21/2023   Comfort measures only status 07/21/2023   Coma (HCC) 07/21/2023   Encounter for antineoplastic chemotherapy 07/14/2023   Liver lesion 07/14/2023   Neoplasm related pain 07/14/2023   Chronic deep vein thrombosis (DVT) of femoral vein of both lower extremities (HCC) 07/03/2023   S/P insertion of IVC (inferior vena caval) filter 07/03/2023   Metabolic acidosis 07/02/2023   Overweight (BMI 25.0-29.9) 07/02/2023   Stage 3a chronic kidney disease (HCC) 06/29/2023   Elevated troponin 06/29/2023   Bilateral pulmonary embolism (HCC) 06/29/2023   Acute pulmonary embolism (HCC) 06/29/2023   Adenocarcinoma of colon (HCC) 06/17/2023   CKD (chronic kidney disease) 06/17/2023   Normocytic anemia 06/17/2023   Weight loss 06/17/2023   Goals of care, counseling/discussion 06/17/2023   Constipation 06/17/2023   IDA (iron  deficiency anemia) 06/17/2023   Encounter for screening colonoscopy 10/22/2016    Chest pain 01/22/2016   Near syncope 05/25/2014   Dizziness 08/19/2012   Vertigo 08/19/2012   Essential hypertension 08/19/2012   Morbid obesity (HCC) 08/19/2012    Palliative Care Assessment & Plan     Recommendations/Plan: Currently on comfort care.   Code Status:    Code Status Orders  (From admission, onward)           Start     Ordered   07/21/23 2209  Do not attempt resuscitation (DNR)- Limited -Do Not Intubate (DNI)  (Code Status)  Continuous       Question Answer Comment  If pulseless and not breathing No CPR or chest compressions.   In Pre-Arrest Conditions (Patient Is Breathing and Has A Pulse) Do not intubate. Provide all appropriate non-invasive medical interventions. Avoid ICU transfer unless indicated or required.   Consent: Discussion documented in EHR or advanced directives reviewed      07/21/23 2208           Code Status History     Date Active Date Inactive Code Status Order ID Comments User Context   07/21/2023 2126 07/21/2023 2208 Full Code [id number]  Fidencio Hue, MD ED   06/29/2023 2333 07/04/2023 1812 Full Code [id number]  Lanetta Pion, MD ED       Meribeth Standard, NP  Please contact Palliative Medicine Team phone at [phone] for questions and concerns.

## 2023-07-24 NOTE — Progress Notes (Signed)
 [facility] Progress Note  Patient: Trevor Flores    XBJ:[id number]  DOA: 07/21/2023     Date of Service: the patient was seen and examined on 07/24/2023  Chief Complaint  Patient presents with   Altered Mental Status   Brief hospital course: Trevor Flores is a 63 y.o. 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  Could not do LP due to current use of Eliquis .  Patient received 1 dose of acyclovir , vancomycin , ampicillin , cefepime  and Flagyl  due to concerning for meningitis.  Also received loading dose of 2 g of Keppra  for possible seizure.  Patient remains comatose status. Neurology was consulted. His prognosis is extremely poor. Extensive discussion was done with his wife, who is very supportive and clearly understands patient's extremely poor prognosis. She agreed for comfort care, which is very reasonable.  CODE STATUS DNR-limited  -Patient was admitted for comfort care. -Stop drawing labs and IVF -prn LORazepam  for agitation - IV morphine  infusion for pain -Naphazoline 0.1 % ophthalmic solution -scopolamine  patch -Psycho/Social: emotional support offered to his wife at bedside -consult to palliterative care team -Nurse may pronounce death      Other active medical issues are listed as below, will  focus on comfort care only now     Stroke [facility])   Coma   SIRS (systemic inflammatory response syndrome) (HCC)   Adenocarcinoma of colon (HCC)   Chronic deep vein thrombosis (DVT) of femoral vein of both lower extremities (HCC)   S/P insertion of IVC (inferior vena caval) filter   Thrombocytopenia (HCC)   Bilateral pulmonary embolism (HCC)   Abnormal LFTs   Essential hypertension   CAD (coronary artery disease)   Chronic diastolic CHF (congestive heart failure) (HCC)   Gout   Stage 3a chronic kidney disease (HCC)   IDA (iron  deficiency anemia)   Overweight (BMI 25.0-29.9)  Body mass index is 27.4 kg/m.  Interventions:  Diet: N.p.o. DVT Prophylaxis: Comfort care  Advance goals of care discussion: DNR-limited  Family Communication: family was present at bedside, at the time of interview.  Patient remained obtunded, lying comfortably on morphine  IV infusion. 5/22 patient's wife was at bedside, she was comfortable with the current plan.  Disposition:  Pt is from home, admitted with acute CVA, transition to comfort care, on morphine  drip, which precludes a safe discharge. Discharge to home with hospice services vs may pass away during hospital stay.  Currently patient is not a stable to transfer. Palliative care and hospice team is following. TOC following   Subjective: No significant events overnight, patient remained comfortable on IV morphine  infusion.  Physical Exam: General: NAD, lying comfortably Eyes: Closed  Cardiovascular: S1 and S2 Present  Respiratory: Breathing comfortably Neurologic: Remained obtunded, unable to follow command.     Vitals:   07/23/23 0754 07/23/23 0830 07/24/23 0424 07/24/23 0818  BP: (!) 94/57  (!) 79/47 (!) 74/46  Pulse: 86  88 90  Resp: 14  16 19   Temp: 99.5 F (37.5 C)  97.6 F (36.4 C) 97.7 F (36.5 C)  TempSrc:      SpO2: 93%  100% 95%  Weight:      Height:  6' (1.829 m)      Intake/Output Summary (Last 24 hours) at 07/24/2023  1439 Last data filed at 07/23/2023 1700 Gross per 24 hour  Intake --  Output 250 ml  Net -250 ml   Filed Weights   07/21/23 1059  Weight: 91.6 kg    Data Reviewed: I have personally reviewed and interpreted daily labs, tele strips, imagings as discussed above. I reviewed all nursing notes, pharmacy notes, vitals, pertinent old records I have discussed plan of care as described above with RN and patient/family.  CBC: Recent Labs  Lab 07/21/23 1108  WBC 5.2  NEUTROABS 3.8  HGB 9.7*  HCT 32.1*  MCV 88.4  PLT 67*   Basic Metabolic Panel: Recent Labs  Lab 07/21/23 1108  NA 143  K 4.0  CL 106  CO2 21*  GLUCOSE 82  BUN 31*  CREATININE 1.26*  CALCIUM  8.5*  MG 2.5*    Studies: No results found.   Scheduled Meds:  Chlorhexidine  Gluconate Cloth  6 each Topical Q0600   levETIRAcetam   500 mg Intravenous Q12H   scopolamine   1 patch Transdermal Q72H   Continuous Infusions:  morphine  1 mg/hr (07/22/23 1801)   PRN Meds: LORazepam , naphazoline-glycerin , ondansetron  (ZOFRAN ) IV  Time spent: 25 minutes  Author: Althia Atlas. MD [facility] 07/24/2023 2:39 PM  To reach On-call, see care teams to locate the attending and reach out to them via [email or web address]. If 7PM-7AM, please contact night-coverage If you still have difficulty reaching the attending provider, please page the [facility] (Director on Call) for [facility] on amion for assistance.

## 2023-07-24 NOTE — Progress Notes (Signed)
 Nutrition Brief Note  Chart reviewed. Pt now transitioning to comfort care.  No further nutrition interventions planned at this time.  Please re-consult as needed.   Levada Schilling, RD, LDN, CDCES Registered Dietitian III Certified Diabetes Care and Education Specialist If unable to reach this RD, please use "RD Inpatient" group chat on secure chat between hours of 8am-4 pm daily

## 2023-07-25 ENCOUNTER — Other Ambulatory Visit: Payer: Self-pay | Admitting: Oncology

## 2023-07-25 DIAGNOSIS — Z515 Encounter for palliative care: Secondary | ICD-10-CM | POA: Diagnosis not present

## 2023-07-25 DIAGNOSIS — Z7189 Other specified counseling: Secondary | ICD-10-CM | POA: Diagnosis not present

## 2023-07-25 NOTE — Progress Notes (Signed)
 [facility] Progress Note  Patient: Trevor Flores    ACZ:[id number]  DOA: 07/21/2023     Date of Service: the patient was seen and examined on 07/25/2023  Chief Complaint  Patient presents with   Altered Mental Status   Brief hospital course: Trevor Flores is a 63 y.o. 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  Could not do LP due to current use of Eliquis .  Patient received 1 dose of acyclovir , vancomycin , ampicillin , cefepime  and Flagyl  due to concerning for meningitis.  Also received loading dose of 2 g of Keppra  for possible seizure.  Patient remains comatose status. Neurology was consulted. His prognosis is extremely poor. Extensive discussion was done with his wife, who is very supportive and clearly understands patient's extremely poor prognosis. She agreed for comfort care, which is very reasonable.  CODE STATUS DNR-limited  -Patient was admitted for comfort care. -Stop drawing labs and IVF -prn LORazepam  for agitation - IV morphine  infusion for pain -Naphazoline 0.1 % ophthalmic solution -scopolamine  patch -Psycho/Social: emotional support offered to his wife at bedside -consult to palliterative care team -Nurse may pronounce death      Other active medical issues are listed as below, will  focus on comfort care only now     Stroke [facility])   Coma   SIRS (systemic inflammatory response syndrome) (HCC)   Adenocarcinoma of colon (HCC)   Chronic deep vein thrombosis (DVT) of femoral vein of both lower extremities (HCC)   S/P insertion of IVC (inferior vena caval) filter   Thrombocytopenia (HCC)   Bilateral pulmonary embolism (HCC)   Abnormal LFTs   Essential hypertension   CAD (coronary artery disease)   Chronic diastolic CHF (congestive heart failure) (HCC)   Gout   Stage 3a chronic kidney disease (HCC)   IDA (iron  deficiency anemia)   Overweight (BMI 25.0-29.9)  Body mass index is 27.4 kg/m.  Interventions:  Diet: N.p.o. DVT Prophylaxis: Comfort care  Advance goals of care discussion: DNR-limited  Family Communication: family was present at bedside, at the time of interview.  Patient remained obtunded, lying comfortably on morphine  IV infusion. 5/23 patient's wife was at bedside, she was comfortable with the current plan.  Disposition:  Pt is from home, admitted with acute CVA, transition to comfort care, on morphine  drip, which precludes a safe discharge. Discharge to home with hospice services vs may pass away during hospital stay.  Currently patient is not a stable to transfer. Palliative care and hospice team is following. TOC following   Subjective: No significant events overnight, patient remained comfortable on IV morphine  infusion.  Physical Exam: General: NAD, lying comfortably Eyes: Closed  Cardiovascular: S1 and S2 Present  Respiratory: Breathing comfortably Neurologic: Remained obtunded, unable to follow command.     Vitals:   07/24/23 0424 07/24/23 0818 07/24/23 2256 07/25/23 0810  BP: (!) 79/47 (!) 74/46 (!) 76/46 (!) 72/43  Pulse: 88 90 91 86  Resp: 16 19 12 12   Temp: 97.6 F (36.4 C) 97.7 F (36.5 C) 99 F (37.2 C) 99.7 F (37.6 C)  TempSrc:      SpO2: 100% 95% 92% 97%  Weight:      Height:        Intake/Output Summary (Last 24  hours) at 07/25/2023 1336 Last data filed at 07/24/2023 1653 Gross per 24 hour  Intake 61.8 ml  Output --  Net 61.8 ml   Filed Weights   07/21/23 1059  Weight: 91.6 kg    Data Reviewed: I have personally reviewed and interpreted daily labs, tele strips, imagings as discussed above. I reviewed all nursing notes, pharmacy notes, vitals, pertinent old records I have discussed plan of care as described above with RN and patient/family.  CBC: Recent Labs  Lab 07/21/23 1108  WBC 5.2  NEUTROABS 3.8  HGB 9.7*  HCT 32.1*  MCV 88.4  PLT 67*   Basic Metabolic Panel: Recent Labs  Lab 07/21/23 1108  NA 143  K 4.0  CL 106  CO2 21*  GLUCOSE 82  BUN 31*  CREATININE 1.26*  CALCIUM  8.5*  MG 2.5*    Studies: No results found.   Scheduled Meds:  Chlorhexidine  Gluconate Cloth  6 each Topical Q0600   levETIRAcetam   500 mg Intravenous Q12H   scopolamine   1 patch Transdermal Q72H   Continuous Infusions:  morphine  1 mg/hr (07/25/23 0117)   PRN Meds: LORazepam , naphazoline-glycerin , ondansetron  (ZOFRAN ) IV  Time spent: 25 minutes  Author: Althia Atlas. MD [facility] 07/25/2023 1:36 PM  To reach On-call, see care teams to locate the attending and reach out to them via [email or web address]. If 7PM-7AM, please contact night-coverage If you still have difficulty reaching the attending provider, please page the [facility] (Director on Call) for [facility] on amion for assistance.

## 2023-07-25 NOTE — Progress Notes (Signed)
 Daily Progress Note   Patient Name: Trevor Flores       Date: 07/25/2023 [name]: 09-19-1960  Age: 63 y.o. MRN#: [id number] Attending Physician: Althia Atlas, MD Primary Care Physician: Antonio Baumgarten, MD Admit Date: 07/21/2023  Reason for Consultation/Follow-up: Establishing goals of care  Subjective: Notes reviewed.  In to see patient.  He is currently resting in bed with eyes closed.  Wife is present and sleeping in bedside chair.    Returned later to check on patient and wife, and wife and son are at bedside.  They discuss that he has fluttered his eyes and opened them slightly.  He has not spoken with them, and has not ate or drank.  They discuss that they would like for him to die in the hospital and do not want him transferred to the hospice facility as they are concerned for the chance of death during transport.  Length of Stay: 4  Current Medications: Scheduled Meds:   Chlorhexidine  Gluconate Cloth  6 each Topical Q0600   levETIRAcetam   500 mg Intravenous Q12H   scopolamine   1 patch Transdermal Q72H    Continuous Infusions:  morphine  1 mg/hr (07/25/23 0117)    PRN Meds: LORazepam , naphazoline-glycerin , ondansetron  (ZOFRAN ) IV  Physical Exam Constitutional:      Comments: Eyes closed  Pulmonary:     Effort: Pulmonary effort is normal.             Vital Signs: BP (!) 72/43 (BP Location: Left Arm)   Pulse 86   Temp 99.7 F (37.6 C)   Resp 12   Ht 6' (1.829 m)   Wt 91.6 kg   SpO2 97%   BMI 27.40 kg/m  SpO2: SpO2: 97 % O2 Device: O2 Device: Room Air O2 Flow Rate:    Intake/output summary:  Intake/Output Summary (Last 24 hours) at 07/25/2023 1520 Last data filed at 07/24/2023 1653 Gross per 24 hour  Intake 61.8 ml  Output --  Net 61.8 ml   LBM: Last BM  Date :  (unknown per pt wife) Baseline Weight: Weight: 91.6 kg Most recent weight: Weight: 91.6 kg         Patient Active Problem List   Diagnosis Date Noted   Stroke (HCC) 07/21/2023   CAD (coronary artery disease) 07/21/2023   Chronic diastolic CHF (congestive  heart failure) (HCC) 07/21/2023   Gout 07/21/2023   SIRS (systemic inflammatory response syndrome) (HCC) 07/21/2023   Thrombocytopenia (HCC) 07/21/2023   Abnormal LFTs 07/21/2023   Comfort measures only status 07/21/2023   Coma (HCC) 07/21/2023   Encounter for antineoplastic chemotherapy 07/14/2023   Liver lesion 07/14/2023   Neoplasm related pain 07/14/2023   Chronic deep vein thrombosis (DVT) of femoral vein of both lower extremities (HCC) 07/03/2023   S/P insertion of IVC (inferior vena caval) filter 07/03/2023   Metabolic acidosis 07/02/2023   Overweight (BMI 25.0-29.9) 07/02/2023   Stage 3a chronic kidney disease (HCC) 06/29/2023   Elevated troponin 06/29/2023   Bilateral pulmonary embolism (HCC) 06/29/2023   Acute pulmonary embolism (HCC) 06/29/2023   Adenocarcinoma of colon (HCC) 06/17/2023   CKD (chronic kidney disease) 06/17/2023   Normocytic anemia 06/17/2023   Weight loss 06/17/2023   Goals of care, counseling/discussion 06/17/2023   Constipation 06/17/2023   IDA (iron  deficiency anemia) 06/17/2023   Encounter for screening colonoscopy 10/22/2016   Chest pain 01/22/2016   Near syncope 05/25/2014   Dizziness 08/19/2012   Vertigo 08/19/2012   Essential hypertension 08/19/2012   Morbid obesity (HCC) 08/19/2012    Palliative Care Assessment & Plan    Recommendations/Plan:  Patient is not eating or drinking.  He does not open his eyes to my visit. Family would like for patient to die in the hospital, and are not amenable to discharging to hospice facility as they are concerned for the chance of death during transport. No changes to symptom management recommended at this time as patient appears  comfortable.  Code Status:    Code Status Orders  (From admission, onward)           Start     Ordered   07/21/23 2209  Do not attempt resuscitation (DNR)- Limited -Do Not Intubate (DNI)  (Code Status)  Continuous       Question Answer Comment  If pulseless and not breathing No CPR or chest compressions.   In Pre-Arrest Conditions (Patient Is Breathing and Has A Pulse) Do not intubate. Provide all appropriate non-invasive medical interventions. Avoid ICU transfer unless indicated or required.   Consent: Discussion documented in EHR or advanced directives reviewed      07/21/23 2208           Code Status History     Date Active Date Inactive Code Status Order ID Comments User Context   07/21/2023 2126 07/21/2023 2208 Full Code [id number]  Fidencio Hue, MD ED   06/29/2023 2333 07/04/2023 1812 Full Code [id number]  Lanetta Pion, MD ED       Thank you for allowing the Palliative Medicine Team to assist in the care of this patient.     Meribeth Standard, NP  Please contact Palliative Medicine Team phone at [phone] for questions and concerns.

## 2023-07-26 DIAGNOSIS — I639 Cerebral infarction, unspecified: Secondary | ICD-10-CM | POA: Diagnosis not present

## 2023-07-26 DIAGNOSIS — Z515 Encounter for palliative care: Secondary | ICD-10-CM | POA: Diagnosis not present

## 2023-07-26 DIAGNOSIS — R4182 Altered mental status, unspecified: Secondary | ICD-10-CM | POA: Diagnosis not present

## 2023-07-26 LAB — CULTURE, BLOOD (ROUTINE X 2)
Culture: NO GROWTH
Culture: NO GROWTH

## 2023-07-26 NOTE — Progress Notes (Signed)
 [facility] Progress Note  Patient: Trevor Flores    ZOX:[id number]  DOA: 07/21/2023     Date of Service: the patient was seen and examined on 07/26/2023  Chief Complaint  Patient presents with   Altered Mental Status   Brief hospital course: Trevor Flores is a 63 y.o. 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  Could not do LP due to current use of Eliquis .  Patient received 1 dose of acyclovir , vancomycin , ampicillin , cefepime  and Flagyl  due to concerning for meningitis.  Also received loading dose of 2 g of Keppra  for possible seizure.  Patient remains comatose status. Neurology was consulted. His prognosis is extremely poor. Extensive discussion was done with his wife, who is very supportive and clearly understands patient's extremely poor prognosis. She agreed for comfort care, which is very reasonable.  CODE STATUS DNR-limited  -Patient was admitted for comfort care. -Stop drawing labs and IVF -prn LORazepam  for agitation - IV morphine  infusion for pain -Naphazoline 0.1 % ophthalmic solution -scopolamine  patch -Psycho/Social: emotional support offered to his wife at bedside -consult to palliterative care team -Nurse may pronounce death      Other active medical issues are listed as below, will  focus on comfort care only now     Stroke [facility])   Coma   SIRS (systemic inflammatory response syndrome) (HCC)   Adenocarcinoma of colon (HCC)   Chronic deep vein thrombosis (DVT) of femoral vein of both lower extremities (HCC)   S/P insertion of IVC (inferior vena caval) filter   Thrombocytopenia (HCC)   Bilateral pulmonary embolism (HCC)   Abnormal LFTs   Essential hypertension   CAD (coronary artery disease)   Chronic diastolic CHF (congestive heart failure) (HCC)   Gout   Stage 3a chronic kidney disease (HCC)   IDA (iron  deficiency anemia)   Overweight (BMI 25.0-29.9)  Body mass index is 27.4 kg/m.  Interventions:  Diet: N.p.o. DVT Prophylaxis: Comfort care  Advance goals of care discussion: DNR-limited  Family Communication: family was present at bedside, at the time of interview.  Patient remained obtunded, lying comfortably on morphine  IV infusion. 5/24 patient's Son was at bedside, he was comfortable with the current plan.  Disposition:  Pt is from home, admitted with acute CVA, transition to comfort care, on morphine  drip, which precludes a safe discharge. Discharge to home with hospice services vs may pass away during hospital stay.  Currently patient is not a stable to transfer. Palliative care and hospice team is following. TOC following   Subjective: No significant events overnight, patient remained comfortable on IV morphine  infusion.  Physical Exam: General: NAD, lying comfortably Eyes: Closed  Cardiovascular: S1 and S2 Present  Respiratory: Breathing comfortably Neurologic: Remained obtunded, unable to follow command.     Vitals:   07/24/23 2256 07/25/23 0810 07/25/23 2056 07/26/23 0913  BP: (!) 76/46 (!) 72/43 (!) 77/50 (!) 75/47  Pulse: 91 86 82 90  Resp: 12 12 16 12   Temp: 99 F (37.2 C) 99.7 F (37.6 C) (!) 97.3 F (36.3 C) 98.9 F (37.2 C)  TempSrc:    Oral  SpO2: 92% 97% 90% 96%  Weight:      Height:       No intake or output data in  the 24 hours ending 07/26/23 1259  Filed Weights   07/21/23 1059  Weight: 91.6 kg    Data Reviewed: I have personally reviewed and interpreted daily labs, tele strips, imagings as discussed above. I reviewed all nursing notes, pharmacy notes, vitals, pertinent old records I have discussed plan of care as described above with RN and patient/family.  CBC: Recent Labs  Lab 07/21/23 1108  WBC 5.2  NEUTROABS 3.8  HGB 9.7*  HCT 32.1*  MCV 88.4  PLT 67*   Basic Metabolic Panel: Recent Labs  Lab 07/21/23 1108  NA 143  K 4.0  CL 106  CO2 21*  GLUCOSE 82  BUN 31*  CREATININE 1.26*  CALCIUM  8.5*  MG 2.5*    Studies: No results found.   Scheduled Meds:  Chlorhexidine  Gluconate Cloth  6 each Topical Q0600   levETIRAcetam   500 mg Intravenous Q12H   scopolamine   1 patch Transdermal Q72H   Continuous Infusions:  morphine  1 mg/hr (07/25/23 0117)   PRN Meds: LORazepam , naphazoline-glycerin , ondansetron  (ZOFRAN ) IV  Time spent: 25 minutes  Author: Althia Atlas. MD [facility] 07/26/2023 12:59 PM  To reach On-call, see care teams to locate the attending and reach out to them via [email or web address]. If 7PM-7AM, please contact night-coverage If you still have difficulty reaching the attending provider, please page the [facility] (Director on Call) for [facility] on amion for assistance.

## 2023-07-26 NOTE — Progress Notes (Signed)
 Palliative Care Progress Note, Assessment & Plan   Patient Name: Trevor Flores       Date: 07/26/2023 [name]: Mar 16, 1960  Age: 63 y.o. MRN#: [id number] Attending Physician: Althia Atlas, MD Primary Care Physician: Antonio Baumgarten, MD Admit Date: 07/21/2023  Subjective: Unable to assess  HPI: 63 y.o. 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 He presented to ED 07/21/23 from home c/o AMS x3 days. Per son, patient had become non-verbal with decreased PO intake. Son and wife were giving fluids by syringe.   ED workup resulted WBC 5.2, GFR >60, Lactic 2.5-2.8-2.5, negative UA except for rare bacteria, INR 1.5. BP 104/68, HR 96, RR 24, 101.3, 100% RA. CXR negative. EEG showed diffuse slowing and focal slowing over the left hemisphere.  CT of head showed possible left parietal stroke. MRI of brain showed large acute L parietal ischemic infarct and numerous smaller acute embolic infarcts in multiple vascular distributions.  Due to extremely poor prognosis, patient was admitted for comfort care.   Palliative care was consulted for goals of care in the setting of stage IV colon cancer and acute CVA with poor prognosis.    Summary of counseling/coordination of care: Extensive chart review completed prior to meeting patient including labs, vital signs, imaging, progress notes, orders, and available advanced directive documents from current and previous encounters.   After reviewing the patient's chart and assessing the patient at bedside, I  spoke with patient's son in regards to symptom management and goals of care.   Ill-appearing male resting in bed. He does not respond to verbal/tactile stimuli. Even, unlabored respirations. He is in no distress. Morphine  gtt infusing.   Patient's son, Trevor Flores, questions what to expect at end of life and what signs to look for. Educated that breathing could be become more labored or the opposite and have long pauses between breaths. Advised that this would be monitored and medications can be adjusted to control symptoms. Also advised to be aware of signs of agitation or anxiety. Trevor Flores verbalizes understanding and is aware that he should notify staff of any change in status so symptoms may be controlled with the goal of keeping his father comfortable.    Therapeutic silence and active listening provided for son to share his thoughts and emotions regarding current medical situation.  Emotional support provided.  Physical Exam Vitals reviewed.  Constitutional:      General: He is not in acute distress.    Appearance: He is ill-appearing.  HENT:     Head: Normocephalic and atraumatic.     Nose:     Comments: O2 via [place]    Mouth/Throat:     Mouth: Mucous membranes are dry.  Pulmonary:     Effort: Pulmonary effort is normal. No respiratory distress.  Musculoskeletal:     Right lower leg: No edema.     Left lower leg: No edema.  Skin:    General: Skin is warm and dry.     Comments: R  chest port    Recommendations/Plan:         Focus of care is comfort and dignity allowing for natural death Utilize ordered medications to maintain comfort Anticipate hospital death           Total Time 50 minutes   Time spent includes: Detailed review of medical records (labs, imaging, vital signs), medically appropriate exam (mental status, respiratory, cardiac, skin), discussed with treatment team, counseling and educating patient, family and staff, documenting clinical information, medication management and  coordination of care.     Ina Manas, Joyice Nodal- [facility] Palliative Medicine Team  07/26/2023 10:02 AM  Office [phone]  Pager [phone]

## 2023-07-27 DIAGNOSIS — I639 Cerebral infarction, unspecified: Secondary | ICD-10-CM | POA: Diagnosis not present

## 2023-07-27 DIAGNOSIS — Z7189 Other specified counseling: Secondary | ICD-10-CM

## 2023-07-27 DIAGNOSIS — R4182 Altered mental status, unspecified: Secondary | ICD-10-CM | POA: Diagnosis not present

## 2023-07-27 DIAGNOSIS — Z515 Encounter for palliative care: Secondary | ICD-10-CM | POA: Diagnosis not present

## 2023-07-27 MED ORDER — ACETAMINOPHEN 10 MG/ML IV SOLN: 1000.0000 mg | Freq: Four times a day (QID) | INTRAVENOUS | Status: DC | PRN

## 2023-07-29 ENCOUNTER — Inpatient Hospital Stay: Admitting: Oncology

## 2023-07-29 ENCOUNTER — Inpatient Hospital Stay

## 2023-07-29 ENCOUNTER — Inpatient Hospital Stay: Admitting: Hospice and Palliative Medicine

## 2023-07-31 ENCOUNTER — Encounter

## 2023-08-03 NOTE — Progress Notes (Signed)
 Vital signs obtained at 1043. Temp elevated at 103F. Notified Kumar MD. Order obtained for tylenol  IV. RN notified by wife to come check patient prior to med administration. Pt noted to be without respirations, no pulse auscultated. Pronounced by charge RN. TOD 1052.

## 2023-08-03 NOTE — Plan of Care (Signed)
  Problem: Coping: Goal: Level of anxiety will decrease Outcome: Not Progressing   Problem: Pain Managment: Goal: General experience of comfort will improve and/or be controlled Outcome: Not Progressing   Problem: Safety: Goal: Ability to remain free from injury will improve Outcome: Not Progressing

## 2023-08-03 NOTE — Progress Notes (Signed)
 Palliative Care Progress Note, Assessment & Plan   Patient Name: Trevor Flores       Date: 07/12/2023 [name]: 1960-03-15  Age: 63 y.o. MRN#: [id number] Attending Physician: Althia Atlas, MD Primary Care Physician: Antonio Baumgarten, MD Admit Date: 07/21/2023  Subjective: Unable to assess  HPI: 63 y.o. 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 He presented to ED 07/21/23 from home c/o AMS x3 days. Per son, patient had become non-verbal with decreased PO intake. Son and wife were giving fluids by syringe.    ED workup resulted WBC 5.2, GFR >60, Lactic 2.5-2.8-2.5, negative UA except for rare bacteria, INR 1.5. BP 104/68, HR 96, RR 24, 101.3, 100% RA. CXR negative. EEG showed diffuse slowing and focal slowing over the left hemisphere.  CT of head showed possible left parietal stroke. MRI of brain showed large acute L parietal ischemic infarct and numerous smaller acute embolic infarcts in multiple vascular distributions.   Due to extremely poor prognosis, patient was admitted for comfort care.    Palliative care was consulted for goals of care in the setting of stage IV colon cancer and acute CVA with poor prognosis.     Summary of counseling/coordination of care: Extensive chart review completed prior to meeting patient including labs, vital signs, imaging, progress notes, orders, and available advanced directive documents from current and previous encounters.   After reviewing the patient's chart and assessing the patient at  bedside, I spoke with patient' wife in regards to symptom management and goals of care.   Ill-appearing male sleeping with wife at bedside. Even, unlabored respirations. He is in no distress. Morphine  gtt infusing.   Wife shares that she feels her husband is comfortable. Offered that her husband is a candidate for the hospice home. She endorses wanting her husband to remain in the hospital and is not interested in transfer to the hospice home. She has no other concerns.  Mrs. Petrey was encouraged to contact PMT if she has questions/concerns.   Therapeutic silence and active listening provided for wife to share her thoughts and emotions regarding current medical situation.  Emotional support provided.  Recommendations/Plan:         Focus of care is comfort and dignity allowing for natural death Utilize ordered medications to maintain comfort Anticipate hospital death       Physical Exam Vitals reviewed.  Constitutional:      General: He is not in acute distress.    Appearance: He is ill-appearing.     Comments: Frail  HENT:     Head: Normocephalic and atraumatic.     Mouth/Throat:     Mouth: Mucous membranes are dry.  Pulmonary:     Effort: Pulmonary effort is normal. No respiratory distress.  Skin:    General: Skin is warm and dry.     Comments: R chest port             Total Time 50 minutes   Time spent includes: Detailed  review of medical records (labs, imaging, vital signs), medically appropriate exam (mental status, respiratory, cardiac, skin), discussed with treatment team, counseling and educating patient, family and staff, documenting clinical information, medication management and coordination of care.     Ina Manas, Joyice Nodal- [facility] Palliative Medicine Team  07/18/2023 8:17 AM  Office [phone]  Pager [phone]

## 2023-08-03 NOTE — Death Summary Note (Signed)
 DEATH SUMMARY   Patient Details  Name: Trevor Flores MRN: [id number] [name]: 10-07-1960 WUJ:[id number], Joellyn Muse, MD Admission/Discharge Information   Admit Date:  Jul 25, 2023  Date of Death: Date of Death: Jul 31, 2023  Time of Death: Time of Death: 09-Aug-1050  Length of Stay: 6   Principle Cause of death: Acute stroke  Hospital Diagnoses: Principal Problem:   Comfort measures only status Active Problems:   Stroke (HCC)   Coma (HCC)   SIRS (systemic inflammatory response syndrome) (HCC)   Adenocarcinoma of colon (HCC)   Chronic deep vein thrombosis (DVT) of femoral vein of both lower extremities (HCC)   S/P insertion of IVC (inferior vena caval) filter   Thrombocytopenia (HCC)   Bilateral pulmonary embolism (HCC)   Abnormal LFTs   Essential hypertension   CAD (coronary artery disease)   Chronic diastolic CHF (congestive heart failure) (HCC)   Gout   Stage 3a chronic kidney disease (HCC)   IDA (iron  deficiency anemia)   Overweight (BMI 25.0-29.9)   Hospital Course: Trevor Flores is a 63 y.o. 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  Could not do LP due to current use of Eliquis .  Patient received 1 dose of acyclovir , vancomycin , ampicillin , cefepime  and Flagyl  due to concerning for meningitis.  Also received  loading dose of 2 g of Keppra  for possible seizure.  Patient remains comatose status. Neurology was consulted. His prognosis is extremely poor. Extensive discussion was done with his wife, who is very supportive and clearly understands patient's extremely poor prognosis. She agreed for comfort care, which is very reasonable.  CODE STATUS DNR-limited   -Patient was admitted for comfort care. -Stop drawing labs and IVF -prn LORazepam  for agitation - s/p IV morphine  infusion for pain -Naphazoline 0.1 % ophthalmic solution -scopolamine  patch -Psycho/Social: emotional support offered to his wife at bedside -consult to palliterative care team -Nurse may pronounce death  2023-07-31 patient passed away at 10:52 AM and death was pronounced by RN.  Family was at bedside.  Procedures: None  Consultations: Neurology, palliative care and hospice care  The results of significant diagnostics from this hospitalization (including imaging, microbiology, ancillary and laboratory) are listed below for reference.   Significant Diagnostic Studies: MR BRAIN W WO CONTRAST Result Date: 07/25/2023 CLINICAL DATA:  Headache, altered mental status. History of colon cancer. EXAM: MRI HEAD WITHOUT AND WITH CONTRAST TECHNIQUE: Multiplanar, multiecho pulse sequences of the brain and surrounding structures were obtained without and with intravenous contrast. CONTRAST:  7.5mL GADAVIST  GADOBUTROL  1 MMOL/ML IV SOLN COMPARISON:  CT head earlier same day. FINDINGS: Brain: Large focus of acute infarct within the left MCA territory involving the left insular cortex, posterior left temporal lobe, and left parietal lobe. There is associated edema and sulcal effacement within this region without associated midline shift. Numerous additional scattered areas of restricted diffusion throughout the bilateral frontal and parietal lobes primarily involving the cortex and subcortical white matter. Additional restricted diffusion  in the bilateral  basal ganglia involving the bilateral caudate heads. Additional restricted diffusion in the bilateral thalami. Scattered foci of cortical and subcortical restricted diffusion in the bilateral occipital lobes and posterior right temporal lobe as well as infarcts in the bilateral cerebellar hemispheres. There is a small focus of enhancement within the posterior left frontal lobe involving the precentral gyrus within the region of infarct. Scattered white matter signal abnormality suggestive of mild chronic microvascular ischemic changes. The basilar cisterns are patent. Choroid fissure cyst on the right. No evidence of intracranial hemorrhage. Ventricles are unremarkable. Vascular: Normal flow voids. Skull and upper cervical spine: Normal marrow signal. Sinuses/Orbits: Orbits are symmetric. Mucosal thickening in the left maxillary and left ethmoid sinuses. Other: Mastoid air cells are clear. IMPRESSION: Multiple scattered areas of acute infarct as described above most pronounced within the left MCA territory. Given bilateral cerebral and cerebellar involvement is concern for embolic etiology. Small focus of enhancement in the posterior left frontal lobe involving the precentral gyrus which could reflect enhancement within a region of subacute infarct. However, given reported history of colon cancer recommend repeat MRI with without contrast in 3 months to document resolution. Electronically Signed   By: Denny Flack M.D.   On: 07/21/2023 21:39   EEG adult Result Date: 07/21/2023 Eleni Griffin, MD     07/22/2023  8:31 PM Routine EEG Report Trevor Flores is a 63 y.o. male with a history of altered mental status who is undergoing an EEG to evaluate for seizures. Report: This EEG was acquired with electrodes placed according to the International 10-20 electrode system (including Fp1, Fp2, F3, F4, C3, C4, P3, P4, O1, O2, T3, T4, T5, T6, A1, A2, Fz, Cz, Pz). The following electrodes were missing or displaced: none.  The occipital dominant rhythm was 6 Hz. This activity is reactive to stimulation. Drowsiness was manifested by background fragmentation; deeper stages of sleep were identified by K complexes and sleep spindles. There was focal slowing over the left posterior region. There were no interictal epileptiform discharges. There were no electrographic seizures identified. Photic stimulation and hyperventilation were not performed. Impression and clinical correlation: This EEG was obtained while awake and asleep and is abnormal due to mild diffuse slowing indicative of global cerebral dysfunction as well as focal slowing over the left posterior in the region of patient's known acute infarct. Epileptiform abnormalities were not seen during this recording. Greg Leaks, MD [facility] [phone] If 7pm- 7am, please page neurology on call as listed in AMION.   CT HEAD WO CONTRAST ( ) Result Date: 07/21/2023 CLINICAL DATA:  Headache, fever EXAM: CT HEAD WITHOUT CONTRAST TECHNIQUE: Contiguous axial images were obtained from the base of the skull through the vertex without intravenous contrast. RADIATION DOSE REDUCTION: This exam was performed according to the departmental dose-optimization program which includes automated exposure control, adjustment of the mA and/or kV according to patient size and/or use of iterative reconstruction technique. COMPARISON:  Jul 08, 2023 FINDINGS: Brain: The ventricles appear age appropriate. No mass effect or midline shift. Large region of hypoattenuation involving the left parietal lobe and the parietal operculum, with loss of gray-white differentiation of the insular cortex. A second small region of cortical hypoattenuation present in the posteromedial right occipital lobe (axial 16). No evidence of acute hemorrhage, extra-axial collection, or mass lesion. The basilar cisterns are patent without downward herniation. The cerebellar hemispheres and vermis are well formed  without mass lesion or focal attenuation abnormality. Vascular: No hyperdense vessel. Skull: Normal.  Negative for fracture or focal lesion. Sinuses/Orbits: Opacification of a couple of ethmoid air cells bilaterally. Otherwise, the remaining paranasal sinuses and mastoids are clear. The globes appear intact. No retrobulbar hematoma. Other: None. IMPRESSION: 1. Findings consistent with a large, acute infarct involving the left parietal lobe. No intracranial hemorrhage visualized. 2. Small region of cortical hypoattenuation in the posteromedial right occipital lobe (axial 16), also worrisome for an acute, but focal occipital lobe infarct. Critical Value/emergent results were called by telephone at the time of interpretation on 07/21/2023 at 3:58 pm to provider Dr. Alejo Amsler, who verbally acknowledged these results. Electronically Signed   By: Rance Burrows M.D.   On: 07/21/2023 16:11   CT Angio Chest PE W and/or Wo Contrast Result Date: 07/21/2023 CLINICAL DATA:  Possible sepsis. Abdominal pain. Known colon cancer with liver metastasis. Evaluate for pulmonary embolism. * Tracking Code: BO * EXAM: CT ANGIOGRAPHY CHEST CT ABDOMEN AND PELVIS WITH CONTRAST TECHNIQUE: Multidetector CT imaging of the chest was performed using the standard protocol during bolus administration of intravenous contrast. Multiplanar CT image reconstructions and MIPs were obtained to evaluate the vascular anatomy. Multidetector CT imaging of the abdomen and pelvis was performed using the standard protocol during bolus administration of intravenous contrast. RADIATION DOSE REDUCTION: This exam was performed according to the departmental dose-optimization program which includes automated exposure control, adjustment of the mA and/or kV according to patient size and/or use of iterative reconstruction technique. CONTRAST:  OMNIPAQUE  IOHEXOL  350 MG/ML SOLN COMPARISON:  Chest radiograph of earlier today. CTA chest 06/29/2023. Most recent  abdominopelvic CT 06/12/2023. FINDINGS: CTA CHEST FINDINGS Cardiovascular: Right greater than left and lower lung predominant moderate volume pulmonary emboli including within distal lobar and segmental branches. Example in the right lower lobe on 69/5. Embolic burden is overall mildly decreased from 06/29/2023. No right heart strain. Right Port-A-Cath tip low SVC. Normal heart size, without pericardial effusion. Lad coronary artery calcification. Pulmonary artery enlargement, outflow tract 3.4 cm. Mediastinum/Nodes: No supraclavicular adenopathy. No mediastinal or hilar adenopathy. Lungs/Pleura: No pleural fluid. Somewhat more confluent subpleural right lower lobe consolidation is favored to represent of all vein infarct. No pulmonary metastasis. Musculoskeletal: No acute osseous abnormality. Review of the MIP images confirms the above findings. CT ABDOMEN and PELVIS FINDINGS Hepatobiliary: Hepatomegaly at 21.8 cm craniocaudal. Bilobar large volume hepatic metastasis again identified. Index anterior right hepatic lobe mass measures 6.5 x 5.7 cm on 32/3 versus 5.6 x 5.6 cm on the prior exam. Anterior left hepatic lobe mass measures 4.1 x 2.7 cm on 25/3 versus 3.6 x 2.5 cm on the prior exam of 06/12/2023 (when remeasured). Normal gallbladder, without biliary ductal dilatation. Pancreas: Normal, without mass or ductal dilatation. Spleen: Artifact degraded evaluation of the upper abdomen, especially the spleen. Subcapsular hypoattenuating lesion of 2.5 cm on 11/03 is suspected. This could represent small volume splenic infarct. There is also an anterior subcapsular subcentimeter hypoattenuating structure on 14/3-possible infarct. No splenomegaly. Adrenals/Urinary Tract: Normal adrenal glands. Bilateral fluid density renal lesions are likely cysts and do not warrant specific imaging follow-up. No hydronephrosis. Normal urinary bladder. Stomach/Bowel: Normal stomach, without wall thickening. Bulky, nonobstructive  proximal sigmoid mass is slightly decreased including on 65/3. Normal small bowel. Vascular/Lymphatic: Advanced aortic and branch vessel atherosclerosis. IVC filter is new since the prior abdominal CT, appropriately positioned. Adenopathy within the sigmoid mesocolon. Example 1.6 cm node on 57/3. 1.7 cm on the prior. Partially calcified more caudal 2.0 cm node on 63/3. 2.2 cm previously. Reproductive: Normal prostate. Other: Trace  pelvic fluid new. No evidence of omental or peritoneal disease. Musculoskeletal: No acute osseous abnormality. Review of the MIP images confirms the above findings. IMPRESSION: CT CHEST IMPRESSION 1. Pulmonary emboli, slightly decreased in volume since 06/29/2023. No evidence of right heart strain. 2. Persistent right lower lobe subpleural pulmonary opacity, favoring evolving infarct. 3. No evidence of metastatic disease in the chest. 4. Pulmonary artery enlargement suggests pulmonary arterial hypertension. 5. Incidental findings, including: Coronary artery atherosclerosis. Aortic Atherosclerosis (ICD10-I70.0). CT ABDOMEN AND PELVIS IMPRESSION 1. Proximal sigmoid primary and regional nodal metastasis, minimally improved. 2. Large volume hepatic metastasis. Difficult to directly compare to the 06/12/2023 exam, which was extremely artifact degraded. Favored to be minimally progressive. 3. No bowel obstruction or other acute complication. 4. New small volume pelvic fluid. 5. Artifact degradation, especially involving the spleen. Suspect small volume splenic infarcts. Electronically Signed   By: Lore Rode M.D.   On: 07/21/2023 14:09   CT ABDOMEN PELVIS W CONTRAST Result Date: 07/21/2023 CLINICAL DATA:  Possible sepsis. Abdominal pain. Known colon cancer with liver metastasis. Evaluate for pulmonary embolism. * Tracking Code: BO * EXAM: CT ANGIOGRAPHY CHEST CT ABDOMEN AND PELVIS WITH CONTRAST TECHNIQUE: Multidetector CT imaging of the chest was performed using the standard protocol during  bolus administration of intravenous contrast. Multiplanar CT image reconstructions and MIPs were obtained to evaluate the vascular anatomy. Multidetector CT imaging of the abdomen and pelvis was performed using the standard protocol during bolus administration of intravenous contrast. RADIATION DOSE REDUCTION: This exam was performed according to the departmental dose-optimization program which includes automated exposure control, adjustment of the mA and/or kV according to patient size and/or use of iterative reconstruction technique. CONTRAST:  OMNIPAQUE  IOHEXOL  350 MG/ML SOLN COMPARISON:  Chest radiograph of earlier today. CTA chest 06/29/2023. Most recent abdominopelvic CT 06/12/2023. FINDINGS: CTA CHEST FINDINGS Cardiovascular: Right greater than left and lower lung predominant moderate volume pulmonary emboli including within distal lobar and segmental branches. Example in the right lower lobe on 69/5. Embolic burden is overall mildly decreased from 06/29/2023. No right heart strain. Right Port-A-Cath tip low SVC. Normal heart size, without pericardial effusion. Lad coronary artery calcification. Pulmonary artery enlargement, outflow tract 3.4 cm. Mediastinum/Nodes: No supraclavicular adenopathy. No mediastinal or hilar adenopathy. Lungs/Pleura: No pleural fluid. Somewhat more confluent subpleural right lower lobe consolidation is favored to represent of all vein infarct. No pulmonary metastasis. Musculoskeletal: No acute osseous abnormality. Review of the MIP images confirms the above findings. CT ABDOMEN and PELVIS FINDINGS Hepatobiliary: Hepatomegaly at 21.8 cm craniocaudal. Bilobar large volume hepatic metastasis again identified. Index anterior right hepatic lobe mass measures 6.5 x 5.7 cm on 32/3 versus 5.6 x 5.6 cm on the prior exam. Anterior left hepatic lobe mass measures 4.1 x 2.7 cm on 25/3 versus 3.6 x 2.5 cm on the prior exam of 06/12/2023 (when remeasured). Normal gallbladder, without  biliary ductal dilatation. Pancreas: Normal, without mass or ductal dilatation. Spleen: Artifact degraded evaluation of the upper abdomen, especially the spleen. Subcapsular hypoattenuating lesion of 2.5 cm on 11/03 is suspected. This could represent small volume splenic infarct. There is also an anterior subcapsular subcentimeter hypoattenuating structure on 14/3-possible infarct. No splenomegaly. Adrenals/Urinary Tract: Normal adrenal glands. Bilateral fluid density renal lesions are likely cysts and do not warrant specific imaging follow-up. No hydronephrosis. Normal urinary bladder. Stomach/Bowel: Normal stomach, without wall thickening. Bulky, nonobstructive proximal sigmoid mass is slightly decreased including on 65/3. Normal small bowel. Vascular/Lymphatic: Advanced aortic and branch vessel atherosclerosis. IVC filter is new  since the prior abdominal CT, appropriately positioned. Adenopathy within the sigmoid mesocolon. Example 1.6 cm node on 57/3. 1.7 cm on the prior. Partially calcified more caudal 2.0 cm node on 63/3. 2.2 cm previously. Reproductive: Normal prostate. Other: Trace pelvic fluid new. No evidence of omental or peritoneal disease. Musculoskeletal: No acute osseous abnormality. Review of the MIP images confirms the above findings. IMPRESSION: CT CHEST IMPRESSION 1. Pulmonary emboli, slightly decreased in volume since 06/29/2023. No evidence of right heart strain. 2. Persistent right lower lobe subpleural pulmonary opacity, favoring evolving infarct. 3. No evidence of metastatic disease in the chest. 4. Pulmonary artery enlargement suggests pulmonary arterial hypertension. 5. Incidental findings, including: Coronary artery atherosclerosis. Aortic Atherosclerosis (ICD10-I70.0). CT ABDOMEN AND PELVIS IMPRESSION 1. Proximal sigmoid primary and regional nodal metastasis, minimally improved. 2. Large volume hepatic metastasis. Difficult to directly compare to the 06/12/2023 exam, which was extremely  artifact degraded. Favored to be minimally progressive. 3. No bowel obstruction or other acute complication. 4. New small volume pelvic fluid. 5. Artifact degradation, especially involving the spleen. Suspect small volume splenic infarcts. Electronically Signed   By: Lore Rode M.D.   On: 07/21/2023 14:09   DG Chest Port 1 View Result Date: 07/21/2023 CLINICAL DATA:  Suspected sepsis. EXAM: PORTABLE CHEST 1 VIEW COMPARISON:  Chest radiograph dated 06/29/2023. FINDINGS: Right-sided Port-A-Cath in similar position. Shallow inspiration. No focal consolidation, pleural effusion or pneumothorax. The cardiac silhouette is within normal limits. Atherosclerotic calcification of the aorta. No acute osseous pathology. IMPRESSION: No active disease. Electronically Signed   By: Angus Bark M.D.   On: 07/21/2023 13:13   CT HEAD WO CONTRAST ( ) Result Date: 07/08/2023 CLINICAL DATA:  Dizziness EXAM: CT HEAD WITHOUT CONTRAST TECHNIQUE: Contiguous axial images were obtained from the base of the skull through the vertex without intravenous contrast. RADIATION DOSE REDUCTION: This exam was performed according to the departmental dose-optimization program which includes automated exposure control, adjustment of the mA and/or kV according to patient size and/or use of iterative reconstruction technique. COMPARISON:  12/05/2011 FINDINGS: Brain: No acute intracranial abnormality. Specifically, no hemorrhage, hydrocephalus, mass lesion, acute infarction, or significant intracranial injury. Vascular: No hyperdense vessel or unexpected calcification. Skull: No acute calvarial abnormality. Sinuses/Orbits: No acute findings Other: None IMPRESSION: No acute intracranial abnormality. Electronically Signed   By: Janeece Mechanic M.D.   On: 07/08/2023 20:24   PERIPHERAL VASCULAR CATHETERIZATION Result Date: 07/02/2023 See surgical note for result.  ECHOCARDIOGRAM COMPLETE Result Date: 06/30/2023    ECHOCARDIOGRAM REPORT   Patient  Name:   Trevor Flores Date of Exam: 06/30/2023 Medical Rec #:  [id number]       Height:       72.0 in Accession #:    [id number]      Weight:       205.0 lb Date of Birth:  09/07/1960       BSA:          2.153 m Patient Age:    63 years        BP:           112/80 mmHg Patient Gender: M               HR:           59 bpm. Exam Location:  [facility] Procedure: 2D Echo, Cardiac Doppler and Color Doppler (Both Spectral and Color            Flow Doppler were utilized during procedure). Indications:  Congestive heart failure I50.9  History:         Patient has prior history of Echocardiogram examinations, most                  recent 04/04/2023. CHF, CAD, Arrythmias:LBBB; Risk                  Factors:Hypertension.  Sonographer:     Broadus Canes Referring Phys:  [id number] Lanetta Pion Diagnosing Phys: Antionette Kirks MD  Sonographer Comments: Suboptimal apical window. Image acquisition challenging due to respiratory motion. IMPRESSIONS  1. Left ventricular ejection fraction, by estimation, is 65 to 70%. The left ventricle has normal function. The left ventricle has no regional wall motion abnormalities. There is mild left ventricular hypertrophy. Left ventricular diastolic parameters are consistent with Grade I diastolic dysfunction (impaired relaxation).  2. Right ventricular systolic function is normal. The right ventricular size is normal. Tricuspid regurgitation signal is inadequate for assessing PA pressure.  3. The mitral valve is normal in structure. No evidence of mitral valve regurgitation. No evidence of mitral stenosis.  4. The aortic valve is normal in structure. Aortic valve regurgitation is not visualized. Aortic valve sclerosis is present, with no evidence of aortic valve stenosis. FINDINGS  Left Ventricle: Left ventricular ejection fraction, by estimation, is 65 to 70%. The left ventricle has normal function. The left ventricle has no regional wall motion abnormalities. The left ventricular internal cavity size  was normal in size. There is  mild left ventricular hypertrophy. Left ventricular diastolic parameters are consistent with Grade I diastolic dysfunction (impaired relaxation). Right Ventricle: The right ventricular size is normal. No increase in right ventricular wall thickness. Right ventricular systolic function is normal. Tricuspid regurgitation signal is inadequate for assessing PA pressure. Left Atrium: Left atrial size was normal in size. Right Atrium: Right atrial size was normal in size. Pericardium: There is no evidence of pericardial effusion. Mitral Valve: The mitral valve is normal in structure. No evidence of mitral valve regurgitation. No evidence of mitral valve stenosis. Tricuspid Valve: The tricuspid valve is normal in structure. Tricuspid valve regurgitation is not demonstrated. No evidence of tricuspid stenosis. Aortic Valve: The aortic valve is normal in structure. Aortic valve regurgitation is not visualized. Aortic valve sclerosis is present, with no evidence of aortic valve stenosis. Aortic valve mean gradient measures 2.5 mmHg. Aortic valve peak gradient measures 4.5 mmHg. Aortic valve area, by VTI measures 3.16 cm. Pulmonic Valve: The pulmonic valve was normal in structure. Pulmonic valve regurgitation is trivial. No evidence of pulmonic stenosis. Aorta: The aortic root is normal in size and structure. Venous: The inferior vena cava was not well visualized. IAS/Shunts: No atrial level shunt detected by color flow Doppler.  LEFT VENTRICLE PLAX 2D LVIDd:         4.60 cm   Diastology LVIDs:         2.80 cm   LV e' medial:    6.96 cm/s LV PW:         1.00 cm   LV E/e' medial:  8.2 LV IVS:        1.20 cm   LV e' lateral:   9.57 cm/s LVOT diam:     2.10 cm   LV E/e' lateral: 6.0 LV SV:         62 LV SV Index:   29 LVOT Area:     3.46 cm  RIGHT VENTRICLE RV Basal diam:  2.80 cm RV Mid diam:  2.50 cm LEFT ATRIUM           Index        RIGHT ATRIUM          Index LA diam:      4.90 cm 2.28 cm/m    RA Area:     8.66 cm LA Vol (A4C): 41.9 ml 19.46 ml/m  RA Volume:   16.40 ml 7.62 ml/m  AORTIC VALVE AV Area (Vmax):    3.06 cm AV Area (Vmean):   2.92 cm AV Area (VTI):     3.16 cm AV Vmax:           106.50 cm/s AV Vmean:          71.850 cm/s AV VTI:            0.197 m AV Peak Grad:      4.5 mmHg AV Mean Grad:      2.5 mmHg LVOT Vmax:         94.20 cm/s LVOT Vmean:        60.500 cm/s LVOT VTI:          0.180 m LVOT/AV VTI ratio: 0.91  AORTA Ao Root diam: 3.50 cm MITRAL VALVE                TRICUSPID VALVE MV Area (PHT): 4.26 cm     TR Peak grad:   11.2 mmHg MV Decel Time: 178 msec     TR Vmax:        167.00 cm/s MV E velocity: 57.10 cm/s MV A velocity: 113.00 cm/s  SHUNTS MV E/A ratio:  0.51         Systemic VTI:  0.18 m                             Systemic Diam: 2.10 cm Antionette Kirks MD Electronically signed by Antionette Kirks MD Signature Date/Time: 06/30/2023/2:28:36 PM    Final    US  Venous Img Lower Bilateral (DVT) Addendum Date: 06/30/2023 ADDENDUM REPORT: 06/30/2023 08:56 ADDENDUM: Regarding the right lower extremity, thrombus appears occlusive in the femoral vein. In the left lower extremity, thrombus appears occlusive in the profunda and calf veins. Critical Value/emergent results were called by telephone at the time of interpretation on 06/30/2023 at 8:56 am to provider Dr. Mariella Shore, who verbally acknowledged these results. Electronically Signed   By: Donnal Fusi M.D.   On: 06/30/2023 08:56   Result Date: 06/30/2023 CLINICAL DATA:  Pulmonary embolus. Lower extremity edema for 3 weeks. EXAM: BILATERAL LOWER EXTREMITY VENOUS DOPPLER ULTRASOUND TECHNIQUE: Gray-scale sonography with graded compression, as well as color Doppler and duplex ultrasound were performed to evaluate the lower extremity deep venous systems from the level of the common femoral vein and including the common femoral, femoral, profunda femoral, popliteal and calf veins including the posterior tibial, peroneal and gastrocnemius  veins when visible. The superficial great saphenous vein was also interrogated. Spectral Doppler was utilized to evaluate flow at rest and with distal augmentation maneuvers in the common femoral, femoral and popliteal veins. COMPARISON:  None Available. FINDINGS: RIGHT LOWER EXTREMITY Common Femoral Vein: Positive for thrombus. Saphenofemoral Junction: Positive for thrombus. Profunda Femoral Vein: Positive for thrombus. Femoral Vein: Positive for thrombus. Popliteal Vein: Positive for thrombus. Calf Veins: Positive for thrombus. Other Findings:  None. LEFT LOWER EXTREMITY Common Femoral Vein: No evidence of thrombus. Normal compressibility, respiratory phasicity and response to augmentation. Saphenofemoral Junction: No evidence of  thrombus. Normal compressibility and flow on color Doppler imaging. Profunda Femoral Vein: Positive for thrombus. Femoral Vein: Positive for thrombus. Popliteal Vein: No evidence of thrombus. Normal compressibility, respiratory phasicity and response to augmentation. Calf Veins: Positive for thrombus. Other Findings:  None. IMPRESSION: Exam is positive for extensive bilateral lower extremity deep venous thrombosis in this patient with a known history of pulmonary embolus. Electronically Signed: By: Donnal Fusi M.D. On: 06/30/2023 08:40   CT Angio Chest PE W and/or Wo Contrast Result Date: 06/29/2023 CLINICAL DATA:  Pulmonary embolism (PE) suspected, high prob. Pt c/o SOB x 1 month, denies cough and fevers. Pt reports recently being dx with colon cancer EXAM: CT ANGIOGRAPHY CHEST WITH CONTRAST TECHNIQUE: Multidetector CT imaging of the chest was performed using the standard protocol during bolus administration of intravenous contrast. Multiplanar CT image reconstructions and MIPs were obtained to evaluate the vascular anatomy. RADIATION DOSE REDUCTION: This exam was performed according to the departmental dose-optimization program which includes automated exposure control, adjustment  of the mA and/or kV according to patient size and/or use of iterative reconstruction technique. CONTRAST:  75mL OMNIPAQUE  IOHEXOL  350 MG/ML SOLN COMPARISON:  CT chest abdomen pelvis 06/12/2023 FINDINGS: Cardiovascular: Right chest wall Port-A-Cath with tip terminating within the superior vena cava. Satisfactory opacification of the pulmonary arteries to the segmental level. Bilateral distal central left and right interlobar artery pulmonary emboli extending to the bilateral lower lobes of the segmental and subsegmental levels. Limited evaluation of the subsegmental level due to timing of contrast and motion artifact. Normal heart size. No significant pericardial effusion. The thoracic aorta is normal in caliber. Mild atherosclerotic plaque of the thoracic aorta. At least three-vessel coronary artery calcifications. Mediastinum/Nodes: No enlarged mediastinal, hilar, or axillary lymph nodes. Thyroid gland, trachea, and esophagus demonstrate no significant findings. Lungs/Pleura: Right lower lobe basilar peribronchovascular peripheral ground-glass airspace opacities and slightly more consolidative peripheral consolidation.No pulmonary nodule. No pulmonary mass. No pleural effusion. No pneumothorax. Upper Abdomen: Poorly visualized known hepatic hypodense lesions. Musculoskeletal: No chest wall abnormality. No suspicious lytic or blastic osseous lesions. No acute displaced fracture. Review of the MIP images confirms the above findings. IMPRESSION: 1. Distal central left and right interlobar pulmonary emboli extending to the bilateral lower lobes of the segmental and subsegmental levels. Extensive pulmonary embolus of the right lower lobe. Right lower lobe basilar finding may represent developing pulmonary infarction versus infection. No right heart strain. 2. Poorly visualized known hepatic metastases. These results were called by telephone at the time of interpretation on 06/29/2023 at 10:55 pm to provider [facility] ,  who verbally acknowledged these results. Electronically Signed   By: Morgane  Naveau M.D.   On: 06/29/2023 22:57   DG Chest Port 1 View Result Date: 06/29/2023 CLINICAL DATA:  [id number] SOB (shortness of breath) [id number] EXAM: PORTABLE CHEST 1 VIEW COMPARISON:  Chest x-ray 06/26/2023 FINDINGS: Right chest wall Port-A-Cath with tip overlying the expected region of the distal superior vena cava. The heart and mediastinal contours are unchanged. Atherosclerotic plaque. No focal consolidation. No pulmonary edema. No pleural effusion. No pneumothorax. No acute osseous abnormality. IMPRESSION: 1. No active disease. 2.  Aortic Atherosclerosis (ICD10-I70.0). Electronically Signed   By: Morgane  Naveau M.D.   On: 06/29/2023 21:51    Microbiology: Recent Results (from the past 240 hours)  Culture, blood (Routine x 2)     Status: None   Collection Time: 07/21/23 11:08 AM   Specimen: BLOOD  Result Value Ref Range Status   Specimen Description BLOOD right  chest  Final   Special Requests   Final    BOTTLES DRAWN AEROBIC AND ANAEROBIC Blood Culture results may not be optimal due to an inadequate volume of blood received in culture bottles   Culture   Final    NO GROWTH 5 DAYS Performed at [facility], [address] [place], [place] [address]    Report Status 07/26/2023 FINAL  Final  Resp panel by RT-PCR (RSV, Flu A&B, Covid) Anterior Nasal Swab     Status: None   Collection Time: 07/21/23 11:08 AM   Specimen: Anterior Nasal Swab  Result Value Ref Range Status   SARS Coronavirus 2 by RT PCR NEGATIVE NEGATIVE Final    Comment: (NOTE) SARS-CoV-2 target nucleic acids are NOT DETECTED.  The SARS-CoV-2 RNA is generally detectable in upper respiratory specimens during the acute phase of infection. The lowest concentration of SARS-CoV-2 viral copies this assay can detect is 138 copies/mL. A negative result does not preclude SARS-Cov-2 infection and should not be used as the sole basis for treatment  or other patient management decisions. A negative result may occur with  improper specimen collection/handling, submission of specimen other than nasopharyngeal swab, presence of viral mutation(s) within the areas targeted by this assay, and inadequate number of viral copies(<138 copies/mL). A negative result must be combined with clinical observations, patient history, and epidemiological information. The expected result is Negative.  Fact Sheet for Patients:  BloggerCourse.com  Fact Sheet for Healthcare Providers:  SeriousBroker.it  This test is no t yet approved or cleared by the [place]  FDA and  has been authorized for detection and/or diagnosis of SARS-CoV-2 by FDA under an Emergency Use Authorization (EUA). This EUA will remain  in effect (meaning this test can be used) for the duration of the COVID-19 declaration under Section 564(b)(1) of the Act, 21 U.S.C.section 360bbb-3(b)(1), unless the authorization is terminated  or revoked sooner.       Influenza A by PCR NEGATIVE NEGATIVE Final   Influenza B by PCR NEGATIVE NEGATIVE Final    Comment: (NOTE) The Xpert Xpress SARS-CoV-2/FLU/RSV plus assay is intended as an aid in the diagnosis of influenza from Nasopharyngeal swab specimens and should not be used as a sole basis for treatment. Nasal washings and aspirates are unacceptable for Xpert Xpress SARS-CoV-2/FLU/RSV testing.  Fact Sheet for Patients: BloggerCourse.com  Fact Sheet for Healthcare Providers: SeriousBroker.it  This test is not yet approved or cleared by the [place]  FDA and has been authorized for detection and/or diagnosis of SARS-CoV-2 by FDA under an Emergency Use Authorization (EUA). This EUA will remain in effect (meaning this test can be used) for the duration of the COVID-19 declaration under Section 564(b)(1) of the Act, 21 U.S.C. section  360bbb-3(b)(1), unless the authorization is terminated or revoked.     Resp Syncytial Virus by PCR NEGATIVE NEGATIVE Final    Comment: (NOTE) Fact Sheet for Patients: BloggerCourse.com  Fact Sheet for Healthcare Providers: SeriousBroker.it  This test is not yet approved or cleared by the [place]  FDA and has been authorized for detection and/or diagnosis of SARS-CoV-2 by FDA under an Emergency Use Authorization (EUA). This EUA will remain in effect (meaning this test can be used) for the duration of the COVID-19 declaration under Section 564(b)(1) of the Act, 21 U.S.C. section 360bbb-3(b)(1), unless the authorization is terminated or revoked.  Performed at [facility], [address] [place], [place] [address]   Culture, blood (Routine x 2)     Status:  None   Collection Time: 07/21/23 11:49 AM   Specimen: BLOOD  Result Value Ref Range Status   Specimen Description BLOOD LEFT ANTECUBITAL  Final   Special Requests   Final    BOTTLES DRAWN AEROBIC AND ANAEROBIC Blood Culture results may not be optimal due to an inadequate volume of blood received in culture bottles   Culture   Final    NO GROWTH 5 DAYS Performed at [facility], [address] [place], [place] [address]    Report Status 07/26/2023 FINAL  Final    Time spent: 25 minutes  Signed: Althia Atlas, MD 07/26/2023

## 2023-08-03 NOTE — Death Summary Note (Incomplete)
 DEATH SUMMARY   Patient Details  Name: Trevor Flores MRN: [id number] [name]: 1960-06-20 WUJ:[id number], Joellyn Muse, MD Admission/Discharge Information   Admit Date:  08-08-23  Date of Death: Date of Death: 08-14-2023  Time of Death: Time of Death: 21-Aug-1050  Length of Stay: 6   Principle Cause of death: Acute stroke  Hospital Diagnoses: Principal Problem:   Comfort measures only status Active Problems:   Stroke (HCC)   Coma (HCC)   SIRS (systemic inflammatory response syndrome) (HCC)   Adenocarcinoma of colon (HCC)   Chronic deep vein thrombosis (DVT) of femoral vein of both lower extremities (HCC)   S/P insertion of IVC (inferior vena caval) filter   Thrombocytopenia (HCC)   Bilateral pulmonary embolism (HCC)   Abnormal LFTs   Essential hypertension   CAD (coronary artery disease)   Chronic diastolic CHF (congestive heart failure) (HCC)   Gout   Stage 3a chronic kidney disease (HCC)   IDA (iron  deficiency anemia)   Overweight (BMI 25.0-29.9)   Hospital Course: Trevor Flores is a 63 y.o. 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  Could not do LP due to current use of Eliquis .  Patient received 1 dose of acyclovir , vancomycin , ampicillin , cefepime  and Flagyl  due to concerning for meningitis.  Also received  loading dose of 2 g of Keppra  for possible seizure.  Patient remains comatose status. Neurology was consulted. His prognosis is extremely poor. Extensive discussion was done with his wife, who is very supportive and clearly understands patient's extremely poor prognosis. She agreed for comfort care, which is very reasonable.  CODE STATUS DNR-limited   -Patient was admitted for comfort care. -Stop drawing labs and IVF -prn LORazepam  for agitation - s/p IV morphine  infusion for pain -Naphazoline 0.1 % ophthalmic solution -scopolamine  patch -Psycho/Social: emotional support offered to his wife at bedside -consult to palliterative care team -Nurse may pronounce death  08/14/23 patient passed away at 10:52 AM and death was pronounced by RN.  Family was at bedside.  Procedures: None  Consultations: Neurology, palliative care and hospice care  The results of significant diagnostics from this hospitalization (including imaging, microbiology, ancillary and laboratory) are listed below for reference.   Significant Diagnostic Studies: MR BRAIN W WO CONTRAST Result Date: August 08, 2023 CLINICAL DATA:  Headache, altered mental status. History of colon cancer. EXAM: MRI HEAD WITHOUT AND WITH CONTRAST TECHNIQUE: Multiplanar, multiecho pulse sequences of the brain and surrounding structures were obtained without and with intravenous contrast. CONTRAST:  7.5mL GADAVIST  GADOBUTROL  1 MMOL/ML IV SOLN COMPARISON:  CT head earlier same day. FINDINGS: Brain: Large focus of acute infarct within the left MCA territory involving the left insular cortex, posterior left temporal lobe, and left parietal lobe. There is associated edema and sulcal effacement within this region without associated midline shift. Numerous additional scattered areas of restricted diffusion throughout the bilateral frontal and parietal lobes primarily involving the cortex and subcortical white matter. Additional restricted diffusion  in the bilateral  basal ganglia involving the bilateral caudate heads. Additional restricted diffusion in the bilateral thalami. Scattered foci of cortical and subcortical restricted diffusion in the bilateral occipital lobes and posterior right temporal lobe as well as infarcts in the bilateral cerebellar hemispheres. There is a small focus of enhancement within the posterior left frontal lobe involving the precentral gyrus within the region of infarct. Scattered white matter signal abnormality suggestive of mild chronic microvascular ischemic changes. The basilar cisterns are patent. Choroid fissure cyst on the right. No evidence of intracranial hemorrhage. Ventricles are unremarkable. Vascular: Normal flow voids. Skull and upper cervical spine: Normal marrow signal. Sinuses/Orbits: Orbits are symmetric. Mucosal thickening in the left maxillary and left ethmoid sinuses. Other: Mastoid air cells are clear. IMPRESSION: Multiple scattered areas of acute infarct as described above most pronounced within the left MCA territory. Given bilateral cerebral and cerebellar involvement is concern for embolic etiology. Small focus of enhancement in the posterior left frontal lobe involving the precentral gyrus which could reflect enhancement within a region of subacute infarct. However, given reported history of colon cancer recommend repeat MRI with without contrast in 3 months to document resolution. Electronically Signed   By: Denny Flack M.D.   On: 07/21/2023 21:39   EEG adult Result Date: 07/21/2023 Eleni Griffin, MD     07/22/2023  8:31 PM Routine EEG Report Trevor Flores is a 63 y.o. male with a history of altered mental status who is undergoing an EEG to evaluate for seizures. Report: This EEG was acquired with electrodes placed according to the International 10-20 electrode system (including Fp1, Fp2, F3, F4, C3, C4, P3, P4, O1, O2, T3, T4, T5, T6, A1, A2, Fz, Cz, Pz). The following electrodes were missing or displaced: none.  The occipital dominant rhythm was 6 Hz. This activity is reactive to stimulation. Drowsiness was manifested by background fragmentation; deeper stages of sleep were identified by K complexes and sleep spindles. There was focal slowing over the left posterior region. There were no interictal epileptiform discharges. There were no electrographic seizures identified. Photic stimulation and hyperventilation were not performed. Impression and clinical correlation: This EEG was obtained while awake and asleep and is abnormal due to mild diffuse slowing indicative of global cerebral dysfunction as well as focal slowing over the left posterior in the region of patient's known acute infarct. Epileptiform abnormalities were not seen during this recording. Greg Leaks, MD [facility] [phone] If 7pm- 7am, please page neurology on call as listed in AMION.   CT HEAD WO CONTRAST ( ) Result Date: 07/21/2023 CLINICAL DATA:  Headache, fever EXAM: CT HEAD WITHOUT CONTRAST TECHNIQUE: Contiguous axial images were obtained from the base of the skull through the vertex without intravenous contrast. RADIATION DOSE REDUCTION: This exam was performed according to the departmental dose-optimization program which includes automated exposure control, adjustment of the mA and/or kV according to patient size and/or use of iterative reconstruction technique. COMPARISON:  Jul 08, 2023 FINDINGS: Brain: The ventricles appear age appropriate. No mass effect or midline shift. Large region of hypoattenuation involving the left parietal lobe and the parietal operculum, with loss of gray-white differentiation of the insular cortex. A second small region of cortical hypoattenuation present in the posteromedial right occipital lobe (axial 16). No evidence of acute hemorrhage, extra-axial collection, or mass lesion. The basilar cisterns are patent without downward herniation. The cerebellar hemispheres and vermis are well formed  without mass lesion or focal attenuation abnormality. Vascular: No hyperdense vessel. Skull: Normal.  Negative for fracture or focal lesion. Sinuses/Orbits: Opacification of a couple of ethmoid air cells bilaterally. Otherwise, the remaining paranasal sinuses and mastoids are clear. The globes appear intact. No retrobulbar hematoma. Other: None. IMPRESSION: 1. Findings consistent with a large, acute infarct involving the left parietal lobe. No intracranial hemorrhage visualized. 2. Small region of cortical hypoattenuation in the posteromedial right occipital lobe (axial 16), also worrisome for an acute, but focal occipital lobe infarct. Critical Value/emergent results were called by telephone at the time of interpretation on 07/21/2023 at 3:58 pm to provider Dr. Alejo Amsler, who verbally acknowledged these results. Electronically Signed   By: Rance Burrows M.D.   On: 07/21/2023 16:11   CT Angio Chest PE W and/or Wo Contrast Result Date: 07/21/2023 CLINICAL DATA:  Possible sepsis. Abdominal pain. Known colon cancer with liver metastasis. Evaluate for pulmonary embolism. * Tracking Code: BO * EXAM: CT ANGIOGRAPHY CHEST CT ABDOMEN AND PELVIS WITH CONTRAST TECHNIQUE: Multidetector CT imaging of the chest was performed using the standard protocol during bolus administration of intravenous contrast. Multiplanar CT image reconstructions and MIPs were obtained to evaluate the vascular anatomy. Multidetector CT imaging of the abdomen and pelvis was performed using the standard protocol during bolus administration of intravenous contrast. RADIATION DOSE REDUCTION: This exam was performed according to the departmental dose-optimization program which includes automated exposure control, adjustment of the mA and/or kV according to patient size and/or use of iterative reconstruction technique. CONTRAST:  100mL OMNIPAQUE  IOHEXOL  350 MG/ML SOLN COMPARISON:  Chest radiograph of earlier today. CTA chest 06/29/2023. Most recent  abdominopelvic CT 06/12/2023. FINDINGS: CTA CHEST FINDINGS Cardiovascular: Right greater than left and lower lung predominant moderate volume pulmonary emboli including within distal lobar and segmental branches. Example in the right lower lobe on 69/5. Embolic burden is overall mildly decreased from 06/29/2023. No right heart strain. Right Port-A-Cath tip low SVC. Normal heart size, without pericardial effusion. Lad coronary artery calcification. Pulmonary artery enlargement, outflow tract 3.4 cm. Mediastinum/Nodes: No supraclavicular adenopathy. No mediastinal or hilar adenopathy. Lungs/Pleura: No pleural fluid. Somewhat more confluent subpleural right lower lobe consolidation is favored to represent of all vein infarct. No pulmonary metastasis. Musculoskeletal: No acute osseous abnormality. Review of the MIP images confirms the above findings. CT ABDOMEN and PELVIS FINDINGS Hepatobiliary: Hepatomegaly at 21.8 cm craniocaudal. Bilobar large volume hepatic metastasis again identified. Index anterior right hepatic lobe mass measures 6.5 x 5.7 cm on 32/3 versus 5.6 x 5.6 cm on the prior exam. Anterior left hepatic lobe mass measures 4.1 x 2.7 cm on 25/3 versus 3.6 x 2.5 cm on the prior exam of 06/12/2023 (when remeasured). Normal gallbladder, without biliary ductal dilatation. Pancreas: Normal, without mass or ductal dilatation. Spleen: Artifact degraded evaluation of the upper abdomen, especially the spleen. Subcapsular hypoattenuating lesion of 2.5 cm on 11/03 is suspected. This could represent small volume splenic infarct. There is also an anterior subcapsular subcentimeter hypoattenuating structure on 14/3-possible infarct. No splenomegaly. Adrenals/Urinary Tract: Normal adrenal glands. Bilateral fluid density renal lesions are likely cysts and do not warrant specific imaging follow-up. No hydronephrosis. Normal urinary bladder. Stomach/Bowel: Normal stomach, without wall thickening. Bulky, nonobstructive  proximal sigmoid mass is slightly decreased including on 65/3. Normal small bowel. Vascular/Lymphatic: Advanced aortic and branch vessel atherosclerosis. IVC filter is new since the prior abdominal CT, appropriately positioned. Adenopathy within the sigmoid mesocolon. Example 1.6 cm node on 57/3. 1.7 cm on the prior. Partially calcified more caudal 2.0 cm node on 63/3. 2.2 cm previously. Reproductive: Normal prostate. Other: Trace  pelvic fluid new. No evidence of omental or peritoneal disease. Musculoskeletal: No acute osseous abnormality. Review of the MIP images confirms the above findings. IMPRESSION: CT CHEST IMPRESSION 1. Pulmonary emboli, slightly decreased in volume since 06/29/2023. No evidence of right heart strain. 2. Persistent right lower lobe subpleural pulmonary opacity, favoring evolving infarct. 3. No evidence of metastatic disease in the chest. 4. Pulmonary artery enlargement suggests pulmonary arterial hypertension. 5. Incidental findings, including: Coronary artery atherosclerosis. Aortic Atherosclerosis (ICD10-I70.0). CT ABDOMEN AND PELVIS IMPRESSION 1. Proximal sigmoid primary and regional nodal metastasis, minimally improved. 2. Large volume hepatic metastasis. Difficult to directly compare to the 06/12/2023 exam, which was extremely artifact degraded. Favored to be minimally progressive. 3. No bowel obstruction or other acute complication. 4. New small volume pelvic fluid. 5. Artifact degradation, especially involving the spleen. Suspect small volume splenic infarcts. Electronically Signed   By: Lore Rode M.D.   On: 07/21/2023 14:09   CT ABDOMEN PELVIS W CONTRAST Result Date: 07/21/2023 CLINICAL DATA:  Possible sepsis. Abdominal pain. Known colon cancer with liver metastasis. Evaluate for pulmonary embolism. * Tracking Code: BO * EXAM: CT ANGIOGRAPHY CHEST CT ABDOMEN AND PELVIS WITH CONTRAST TECHNIQUE: Multidetector CT imaging of the chest was performed using the standard protocol during  bolus administration of intravenous contrast. Multiplanar CT image reconstructions and MIPs were obtained to evaluate the vascular anatomy. Multidetector CT imaging of the abdomen and pelvis was performed using the standard protocol during bolus administration of intravenous contrast. RADIATION DOSE REDUCTION: This exam was performed according to the departmental dose-optimization program which includes automated exposure control, adjustment of the mA and/or kV according to patient size and/or use of iterative reconstruction technique. CONTRAST:  OMNIPAQUE  IOHEXOL  350 MG/ML SOLN COMPARISON:  Chest radiograph of earlier today. CTA chest 06/29/2023. Most recent abdominopelvic CT 06/12/2023. FINDINGS: CTA CHEST FINDINGS Cardiovascular: Right greater than left and lower lung predominant moderate volume pulmonary emboli including within distal lobar and segmental branches. Example in the right lower lobe on 69/5. Embolic burden is overall mildly decreased from 06/29/2023. No right heart strain. Right Port-A-Cath tip low SVC. Normal heart size, without pericardial effusion. Lad coronary artery calcification. Pulmonary artery enlargement, outflow tract 3.4 cm. Mediastinum/Nodes: No supraclavicular adenopathy. No mediastinal or hilar adenopathy. Lungs/Pleura: No pleural fluid. Somewhat more confluent subpleural right lower lobe consolidation is favored to represent of all vein infarct. No pulmonary metastasis. Musculoskeletal: No acute osseous abnormality. Review of the MIP images confirms the above findings. CT ABDOMEN and PELVIS FINDINGS Hepatobiliary: Hepatomegaly at 21.8 cm craniocaudal. Bilobar large volume hepatic metastasis again identified. Index anterior right hepatic lobe mass measures 6.5 x 5.7 cm on 32/3 versus 5.6 x 5.6 cm on the prior exam. Anterior left hepatic lobe mass measures 4.1 x 2.7 cm on 25/3 versus 3.6 x 2.5 cm on the prior exam of 06/12/2023 (when remeasured). Normal gallbladder, without  biliary ductal dilatation. Pancreas: Normal, without mass or ductal dilatation. Spleen: Artifact degraded evaluation of the upper abdomen, especially the spleen. Subcapsular hypoattenuating lesion of 2.5 cm on 11/03 is suspected. This could represent small volume splenic infarct. There is also an anterior subcapsular subcentimeter hypoattenuating structure on 14/3-possible infarct. No splenomegaly. Adrenals/Urinary Tract: Normal adrenal glands. Bilateral fluid density renal lesions are likely cysts and do not warrant specific imaging follow-up. No hydronephrosis. Normal urinary bladder. Stomach/Bowel: Normal stomach, without wall thickening. Bulky, nonobstructive proximal sigmoid mass is slightly decreased including on 65/3. Normal small bowel. Vascular/Lymphatic: Advanced aortic and branch vessel atherosclerosis. IVC filter is new  since the prior abdominal CT, appropriately positioned. Adenopathy within the sigmoid mesocolon. Example 1.6 cm node on 57/3. 1.7 cm on the prior. Partially calcified more caudal 2.0 cm node on 63/3. 2.2 cm previously. Reproductive: Normal prostate. Other: Trace pelvic fluid new. No evidence of omental or peritoneal disease. Musculoskeletal: No acute osseous abnormality. Review of the MIP images confirms the above findings. IMPRESSION: CT CHEST IMPRESSION 1. Pulmonary emboli, slightly decreased in volume since 06/29/2023. No evidence of right heart strain. 2. Persistent right lower lobe subpleural pulmonary opacity, favoring evolving infarct. 3. No evidence of metastatic disease in the chest. 4. Pulmonary artery enlargement suggests pulmonary arterial hypertension. 5. Incidental findings, including: Coronary artery atherosclerosis. Aortic Atherosclerosis (ICD10-I70.0). CT ABDOMEN AND PELVIS IMPRESSION 1. Proximal sigmoid primary and regional nodal metastasis, minimally improved. 2. Large volume hepatic metastasis. Difficult to directly compare to the 06/12/2023 exam, which was extremely  artifact degraded. Favored to be minimally progressive. 3. No bowel obstruction or other acute complication. 4. New small volume pelvic fluid. 5. Artifact degradation, especially involving the spleen. Suspect small volume splenic infarcts. Electronically Signed   By: Lore Rode M.D.   On: 07/21/2023 14:09   DG Chest Port 1 View Result Date: 07/21/2023 CLINICAL DATA:  Suspected sepsis. EXAM: PORTABLE CHEST 1 VIEW COMPARISON:  Chest radiograph dated 06/29/2023. FINDINGS: Right-sided Port-A-Cath in similar position. Shallow inspiration. No focal consolidation, pleural effusion or pneumothorax. The cardiac silhouette is within normal limits. Atherosclerotic calcification of the aorta. No acute osseous pathology. IMPRESSION: No active disease. Electronically Signed   By: Angus Bark M.D.   On: 07/21/2023 13:13   CT HEAD WO CONTRAST ( ) Result Date: 07/08/2023 CLINICAL DATA:  Dizziness EXAM: CT HEAD WITHOUT CONTRAST TECHNIQUE: Contiguous axial images were obtained from the base of the skull through the vertex without intravenous contrast. RADIATION DOSE REDUCTION: This exam was performed according to the departmental dose-optimization program which includes automated exposure control, adjustment of the mA and/or kV according to patient size and/or use of iterative reconstruction technique. COMPARISON:  12/05/2011 FINDINGS: Brain: No acute intracranial abnormality. Specifically, no hemorrhage, hydrocephalus, mass lesion, acute infarction, or significant intracranial injury. Vascular: No hyperdense vessel or unexpected calcification. Skull: No acute calvarial abnormality. Sinuses/Orbits: No acute findings Other: None IMPRESSION: No acute intracranial abnormality. Electronically Signed   By: Janeece Mechanic M.D.   On: 07/08/2023 20:24   PERIPHERAL VASCULAR CATHETERIZATION Result Date: 07/02/2023 See surgical note for result.  ECHOCARDIOGRAM COMPLETE Result Date: 06/30/2023    ECHOCARDIOGRAM REPORT   Patient  Name:   Trevor Flores Date of Exam: 06/30/2023 Medical Rec #:  [id number]       Height:       72.0 in Accession #:    [id number]      Weight:       205.0 lb Date of Birth:  [birth year]       BSA:          2.153 m Patient Age:    63 years        BP:           112/80 mmHg Patient Gender: M               HR:           59 bpm. Exam Location:  [facility] Procedure: 2D Echo, Cardiac Doppler and Color Doppler (Both Spectral and Color            Flow Doppler were utilized during procedure). Indications:  Congestive heart failure I50.9  History:         Patient has prior history of Echocardiogram examinations, most                  recent 04/04/2023. CHF, CAD, Arrythmias:LBBB; Risk                  Factors:Hypertension.  Sonographer:     Broadus Canes Referring Phys:  [id number] Lanetta Pion Diagnosing Phys: Antionette Kirks MD  Sonographer Comments: Suboptimal apical window. Image acquisition challenging due to respiratory motion. IMPRESSIONS  1. Left ventricular ejection fraction, by estimation, is 65 to 70%. The left ventricle has normal function. The left ventricle has no regional wall motion abnormalities. There is mild left ventricular hypertrophy. Left ventricular diastolic parameters are consistent with Grade I diastolic dysfunction (impaired relaxation).  2. Right ventricular systolic function is normal. The right ventricular size is normal. Tricuspid regurgitation signal is inadequate for assessing PA pressure.  3. The mitral valve is normal in structure. No evidence of mitral valve regurgitation. No evidence of mitral stenosis.  4. The aortic valve is normal in structure. Aortic valve regurgitation is not visualized. Aortic valve sclerosis is present, with no evidence of aortic valve stenosis. FINDINGS  Left Ventricle: Left ventricular ejection fraction, by estimation, is 65 to 70%. The left ventricle has normal function. The left ventricle has no regional wall motion abnormalities. The left ventricular internal cavity size  was normal in size. There is  mild left ventricular hypertrophy. Left ventricular diastolic parameters are consistent with Grade I diastolic dysfunction (impaired relaxation). Right Ventricle: The right ventricular size is normal. No increase in right ventricular wall thickness. Right ventricular systolic function is normal. Tricuspid regurgitation signal is inadequate for assessing PA pressure. Left Atrium: Left atrial size was normal in size. Right Atrium: Right atrial size was normal in size. Pericardium: There is no evidence of pericardial effusion. Mitral Valve: The mitral valve is normal in structure. No evidence of mitral valve regurgitation. No evidence of mitral valve stenosis. Tricuspid Valve: The tricuspid valve is normal in structure. Tricuspid valve regurgitation is not demonstrated. No evidence of tricuspid stenosis. Aortic Valve: The aortic valve is normal in structure. Aortic valve regurgitation is not visualized. Aortic valve sclerosis is present, with no evidence of aortic valve stenosis. Aortic valve mean gradient measures 2.5 mmHg. Aortic valve peak gradient measures 4.5 mmHg. Aortic valve area, by VTI measures 3.16 cm. Pulmonic Valve: The pulmonic valve was normal in structure. Pulmonic valve regurgitation is trivial. No evidence of pulmonic stenosis. Aorta: The aortic root is normal in size and structure. Venous: The inferior vena cava was not well visualized. IAS/Shunts: No atrial level shunt detected by color flow Doppler.  LEFT VENTRICLE PLAX 2D LVIDd:         4.60 cm   Diastology LVIDs:         2.80 cm   LV e' medial:    6.96 cm/s LV PW:         1.00 cm   LV E/e' medial:  8.2 LV IVS:        1.20 cm   LV e' lateral:   9.57 cm/s LVOT diam:     2.10 cm   LV E/e' lateral: 6.0 LV SV:         62 LV SV Index:   29 LVOT Area:     3.46 cm  RIGHT VENTRICLE RV Basal diam:  2.80 cm RV Mid diam:  2.50 cm LEFT ATRIUM           Index        RIGHT ATRIUM          Index LA diam:      4.90 cm 2.28 cm/m    RA Area:     8.66 cm LA Vol (A4C): 41.9 ml 19.46 ml/m  RA Volume:   16.40 ml 7.62 ml/m  AORTIC VALVE AV Area (Vmax):    3.06 cm AV Area (Vmean):   2.92 cm AV Area (VTI):     3.16 cm AV Vmax:           106.50 cm/s AV Vmean:          71.850 cm/s AV VTI:            0.197 m AV Peak Grad:      4.5 mmHg AV Mean Grad:      2.5 mmHg LVOT Vmax:         94.20 cm/s LVOT Vmean:        60.500 cm/s LVOT VTI:          0.180 m LVOT/AV VTI ratio: 0.91  AORTA Ao Root diam: 3.50 cm MITRAL VALVE                TRICUSPID VALVE MV Area (PHT): 4.26 cm     TR Peak grad:   11.2 mmHg MV Decel Time: 178 msec     TR Vmax:        167.00 cm/s MV E velocity: 57.10 cm/s MV A velocity: 113.00 cm/s  SHUNTS MV E/A ratio:  0.51         Systemic VTI:  0.18 m                             Systemic Diam: 2.10 cm Antionette Kirks MD Electronically signed by Antionette Kirks MD Signature Date/Time: 06/30/2023/2:28:36 PM    Final    US  Venous Img Lower Bilateral (DVT) Addendum Date: 06/30/2023 ADDENDUM REPORT: 06/30/2023 08:56 ADDENDUM: Regarding the right lower extremity, thrombus appears occlusive in the femoral vein. In the left lower extremity, thrombus appears occlusive in the profunda and calf veins. Critical Value/emergent results were called by telephone at the time of interpretation on 06/30/2023 at 8:56 am to provider Dr. Mariella Shore, who verbally acknowledged these results. Electronically Signed   By: Donnal Fusi M.D.   On: 06/30/2023 08:56   Result Date: 06/30/2023 CLINICAL DATA:  Pulmonary embolus. Lower extremity edema for 3 weeks. EXAM: BILATERAL LOWER EXTREMITY VENOUS DOPPLER ULTRASOUND TECHNIQUE: Gray-scale sonography with graded compression, as well as color Doppler and duplex ultrasound were performed to evaluate the lower extremity deep venous systems from the level of the common femoral vein and including the common femoral, femoral, profunda femoral, popliteal and calf veins including the posterior tibial, peroneal and gastrocnemius  veins when visible. The superficial great saphenous vein was also interrogated. Spectral Doppler was utilized to evaluate flow at rest and with distal augmentation maneuvers in the common femoral, femoral and popliteal veins. COMPARISON:  None Available. FINDINGS: RIGHT LOWER EXTREMITY Common Femoral Vein: Positive for thrombus. Saphenofemoral Junction: Positive for thrombus. Profunda Femoral Vein: Positive for thrombus. Femoral Vein: Positive for thrombus. Popliteal Vein: Positive for thrombus. Calf Veins: Positive for thrombus. Other Findings:  None. LEFT LOWER EXTREMITY Common Femoral Vein: No evidence of thrombus. Normal compressibility, respiratory phasicity and response to augmentation. Saphenofemoral Junction: No evidence of  thrombus. Normal compressibility and flow on color Doppler imaging. Profunda Femoral Vein: Positive for thrombus. Femoral Vein: Positive for thrombus. Popliteal Vein: No evidence of thrombus. Normal compressibility, respiratory phasicity and response to augmentation. Calf Veins: Positive for thrombus. Other Findings:  None. IMPRESSION: Exam is positive for extensive bilateral lower extremity deep venous thrombosis in this patient with a known history of pulmonary embolus. Electronically Signed: By: Donnal Fusi M.D. On: 06/30/2023 08:40   CT Angio Chest PE W and/or Wo Contrast Result Date: 06/29/2023 CLINICAL DATA:  Pulmonary embolism (PE) suspected, high prob. Pt c/o SOB x 1 month, denies cough and fevers. Pt reports recently being dx with colon cancer EXAM: CT ANGIOGRAPHY CHEST WITH CONTRAST TECHNIQUE: Multidetector CT imaging of the chest was performed using the standard protocol during bolus administration of intravenous contrast. Multiplanar CT image reconstructions and MIPs were obtained to evaluate the vascular anatomy. RADIATION DOSE REDUCTION: This exam was performed according to the departmental dose-optimization program which includes automated exposure control, adjustment  of the mA and/or kV according to patient size and/or use of iterative reconstruction technique. CONTRAST:  75mL OMNIPAQUE  IOHEXOL  350 MG/ML SOLN COMPARISON:  CT chest abdomen pelvis 06/12/2023 FINDINGS: Cardiovascular: Right chest wall Port-A-Cath with tip terminating within the superior vena cava. Satisfactory opacification of the pulmonary arteries to the segmental level. Bilateral distal central left and right interlobar artery pulmonary emboli extending to the bilateral lower lobes of the segmental and subsegmental levels. Limited evaluation of the subsegmental level due to timing of contrast and motion artifact. Normal heart size. No significant pericardial effusion. The thoracic aorta is normal in caliber. Mild atherosclerotic plaque of the thoracic aorta. At least three-vessel coronary artery calcifications. Mediastinum/Nodes: No enlarged mediastinal, hilar, or axillary lymph nodes. Thyroid gland, trachea, and esophagus demonstrate no significant findings. Lungs/Pleura: Right lower lobe basilar peribronchovascular peripheral ground-glass airspace opacities and slightly more consolidative peripheral consolidation.No pulmonary nodule. No pulmonary mass. No pleural effusion. No pneumothorax. Upper Abdomen: Poorly visualized known hepatic hypodense lesions. Musculoskeletal: No chest wall abnormality. No suspicious lytic or blastic osseous lesions. No acute displaced fracture. Review of the MIP images confirms the above findings. IMPRESSION: 1. Distal central left and right interlobar pulmonary emboli extending to the bilateral lower lobes of the segmental and subsegmental levels. Extensive pulmonary embolus of the right lower lobe. Right lower lobe basilar finding may represent developing pulmonary infarction versus infection. No right heart strain. 2. Poorly visualized known hepatic metastases. These results were called by telephone at the time of interpretation on 06/29/2023 at 10:55 pm to provider [facility] ,  who verbally acknowledged these results. Electronically Signed   By: Morgane  Naveau M.D.   On: 06/29/2023 22:57   DG Chest Port 1 View Result Date: 06/29/2023 CLINICAL DATA:  [id number] SOB (shortness of breath) [id number] EXAM: PORTABLE CHEST 1 VIEW COMPARISON:  Chest x-ray 06/26/2023 FINDINGS: Right chest wall Port-A-Cath with tip overlying the expected region of the distal superior vena cava. The heart and mediastinal contours are unchanged. Atherosclerotic plaque. No focal consolidation. No pulmonary edema. No pleural effusion. No pneumothorax. No acute osseous abnormality. IMPRESSION: 1. No active disease. 2.  Aortic Atherosclerosis (ICD10-I70.0). Electronically Signed   By: Morgane  Naveau M.D.   On: 06/29/2023 21:51    Microbiology: Recent Results (from the past 240 hours)  Culture, blood (Routine x 2)     Status: None   Collection Time: 07/21/23 11:08 AM   Specimen: BLOOD  Result Value Ref Range Status   Specimen Description BLOOD right  chest  Final   Special Requests   Final    BOTTLES DRAWN AEROBIC AND ANAEROBIC Blood Culture results may not be optimal due to an inadequate volume of blood received in culture bottles   Culture   Final    NO GROWTH 5 DAYS Performed at [facility], [address] [place], [place] [address]    Report Status 07/26/2023 FINAL  Final  Resp panel by RT-PCR (RSV, Flu A&B, Covid) Anterior Nasal Swab     Status: None   Collection Time: 07/21/23 11:08 AM   Specimen: Anterior Nasal Swab  Result Value Ref Range Status   SARS Coronavirus 2 by RT PCR NEGATIVE NEGATIVE Final    Comment: (NOTE) SARS-CoV-2 target nucleic acids are NOT DETECTED.  The SARS-CoV-2 RNA is generally detectable in upper respiratory specimens during the acute phase of infection. The lowest concentration of SARS-CoV-2 viral copies this assay can detect is 138 copies/mL. A negative result does not preclude SARS-Cov-2 infection and should not be used as the sole basis for treatment  or other patient management decisions. A negative result may occur with  improper specimen collection/handling, submission of specimen other than nasopharyngeal swab, presence of viral mutation(s) within the areas targeted by this assay, and inadequate number of viral copies(<138 copies/mL). A negative result must be combined with clinical observations, patient history, and epidemiological information. The expected result is Negative.  Fact Sheet for Patients:  BloggerCourse.com  Fact Sheet for Healthcare Providers:  SeriousBroker.it  This test is no t yet approved or cleared by the [place]  FDA and  has been authorized for detection and/or diagnosis of SARS-CoV-2 by FDA under an Emergency Use Authorization (EUA). This EUA will remain  in effect (meaning this test can be used) for the duration of the COVID-19 declaration under Section 564(b)(1) of the Act, 21 U.S.C.section 360bbb-3(b)(1), unless the authorization is terminated  or revoked sooner.       Influenza A by PCR NEGATIVE NEGATIVE Final   Influenza B by PCR NEGATIVE NEGATIVE Final    Comment: (NOTE) The Xpert Xpress SARS-CoV-2/FLU/RSV plus assay is intended as an aid in the diagnosis of influenza from Nasopharyngeal swab specimens and should not be used as a sole basis for treatment. Nasal washings and aspirates are unacceptable for Xpert Xpress SARS-CoV-2/FLU/RSV testing.  Fact Sheet for Patients: BloggerCourse.com  Fact Sheet for Healthcare Providers: SeriousBroker.it  This test is not yet approved or cleared by the [place]  FDA and has been authorized for detection and/or diagnosis of SARS-CoV-2 by FDA under an Emergency Use Authorization (EUA). This EUA will remain in effect (meaning this test can be used) for the duration of the COVID-19 declaration under Section 564(b)(1) of the Act, 21 U.S.C. section  360bbb-3(b)(1), unless the authorization is terminated or revoked.     Resp Syncytial Virus by PCR NEGATIVE NEGATIVE Final    Comment: (NOTE) Fact Sheet for Patients: BloggerCourse.com  Fact Sheet for Healthcare Providers: SeriousBroker.it  This test is not yet approved or cleared by the [place]  FDA and has been authorized for detection and/or diagnosis of SARS-CoV-2 by FDA under an Emergency Use Authorization (EUA). This EUA will remain in effect (meaning this test can be used) for the duration of the COVID-19 declaration under Section 564(b)(1) of the Act, 21 U.S.C. section 360bbb-3(b)(1), unless the authorization is terminated or revoked.  Performed at [facility], [address] [place], [place] [address]   Culture, blood (Routine x 2)     Status:  None   Collection Time: 07/21/23 11:49 AM   Specimen: BLOOD  Result Value Ref Range Status   Specimen Description BLOOD LEFT ANTECUBITAL  Final   Special Requests   Final    BOTTLES DRAWN AEROBIC AND ANAEROBIC Blood Culture results may not be optimal due to an inadequate volume of blood received in culture bottles   Culture   Final    NO GROWTH 5 DAYS Performed at [facility], [address] [place], [place] [address]    Report Status 07/26/2023 FINAL  Final    Time spent: 25 minutes  Signed: Althia Atlas, MD 07/04/2023

## 2023-08-03 DEATH — deceased

## 2023-11-15 IMAGING — CR DG CERVICAL SPINE 2 OR 3 VIEWS
3 series · 3 of 3 positions shown · non-contrast
Comparison: CT of the cervical spine 11/07/2009

CLINICAL DATA: Neck pain off and on for 8-9 months. No known
injury.

EXAM:
CERVICAL SPINE - 2-3 VIEW

[c-spine lat]
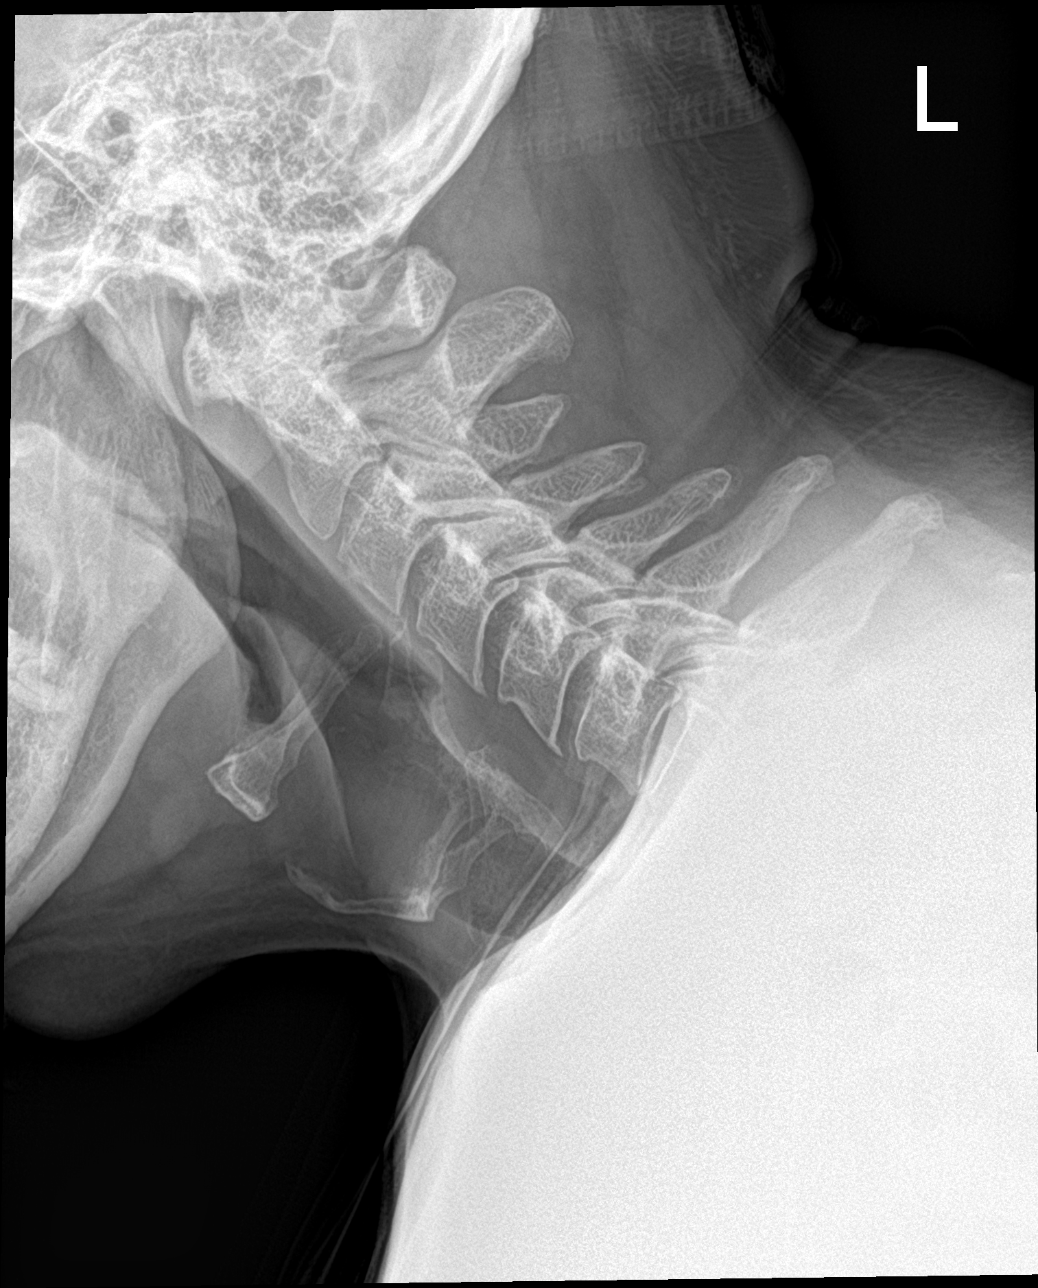

[c-spine ap]
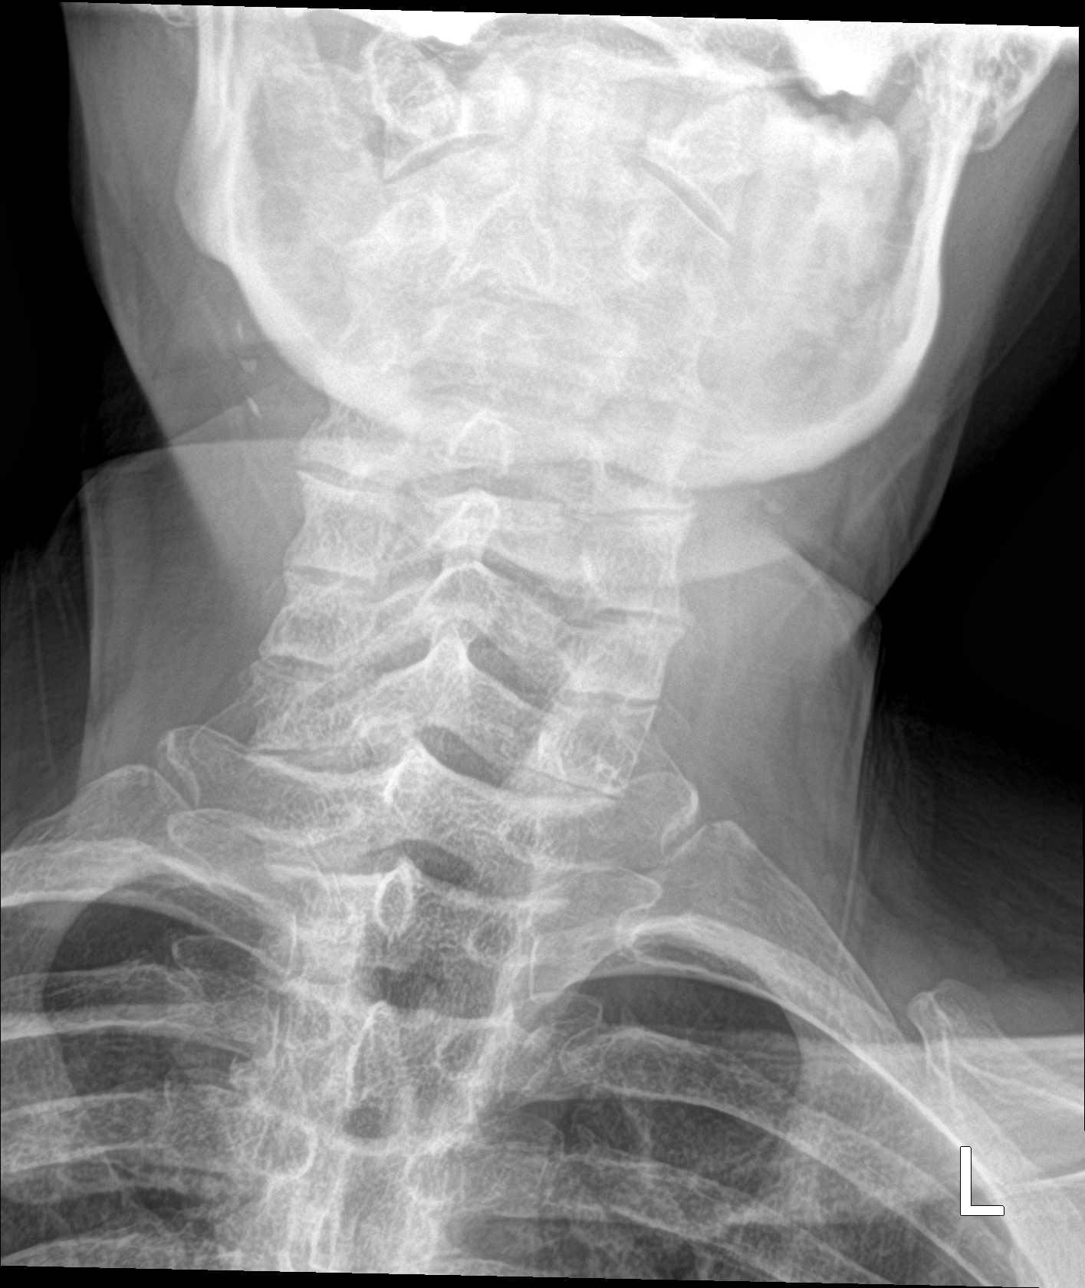

[c-spine swimmers]
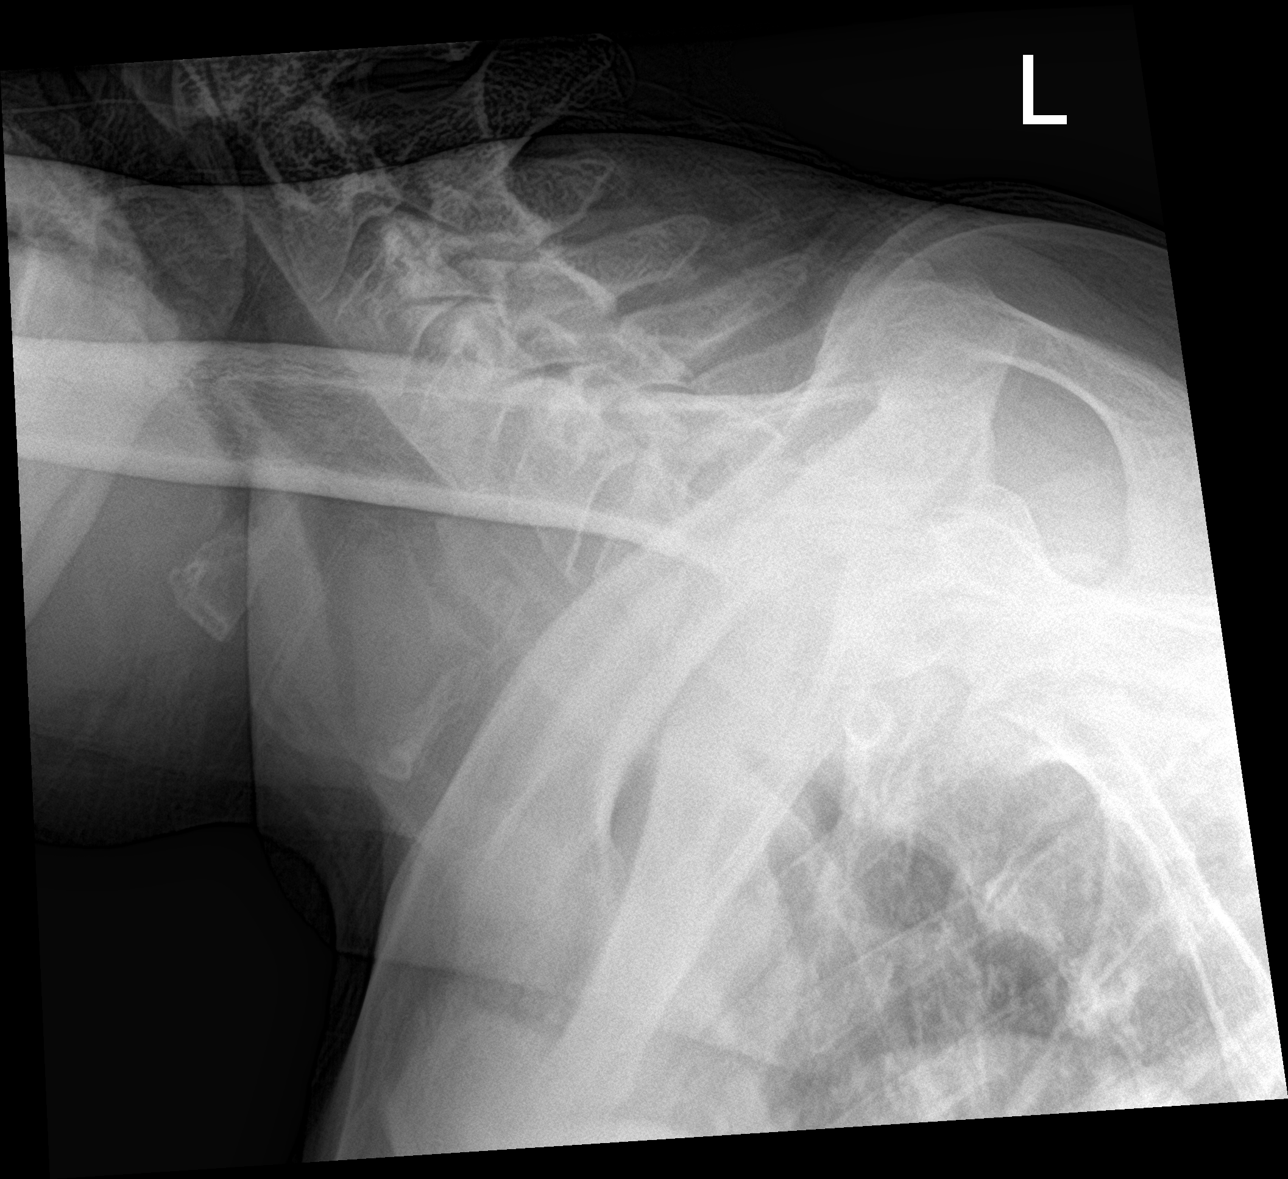

[3 of 3 positions shown; findings below may reference images not displayed]

FINDINGS: There is no evidence of cervical spine fracture or prevertebral soft
tissue swelling. Alignment is normal. No other significant bone
abnormalities are identified.
IMPRESSION: Negative cervical spine radiographs.
